# Patient Record
Sex: Female | Born: 1935
Health system: Southern US, Community
[De-identification: ages and names within clinical notes are randomized; demographics above are authoritative.]

## PROBLEM LIST (undated history)

## (undated) DIAGNOSIS — N289 Disorder of kidney and ureter, unspecified: Secondary | ICD-10-CM

## (undated) DIAGNOSIS — K922 Gastrointestinal hemorrhage, unspecified: Secondary | ICD-10-CM

## (undated) DIAGNOSIS — S72009A Fracture of unspecified part of neck of unspecified femur, initial encounter for closed fracture: Secondary | ICD-10-CM

## (undated) DIAGNOSIS — D539 Nutritional anemia, unspecified: Secondary | ICD-10-CM

## (undated) DIAGNOSIS — M069 Rheumatoid arthritis, unspecified: Secondary | ICD-10-CM

## (undated) DIAGNOSIS — E46 Unspecified protein-calorie malnutrition: Secondary | ICD-10-CM

## (undated) DIAGNOSIS — C50911 Malignant neoplasm of unspecified site of right female breast: Secondary | ICD-10-CM

## (undated) DIAGNOSIS — C50919 Malignant neoplasm of unspecified site of unspecified female breast: Secondary | ICD-10-CM

## (undated) DIAGNOSIS — R51 Headache: Secondary | ICD-10-CM

## (undated) DIAGNOSIS — M0520 Rheumatoid vasculitis with rheumatoid arthritis of unspecified site: Secondary | ICD-10-CM

## (undated) DIAGNOSIS — F0390 Unspecified dementia without behavioral disturbance: Secondary | ICD-10-CM

## (undated) DIAGNOSIS — G459 Transient cerebral ischemic attack, unspecified: Secondary | ICD-10-CM

## (undated) DIAGNOSIS — F329 Major depressive disorder, single episode, unspecified: Secondary | ICD-10-CM

## (undated) DIAGNOSIS — K5730 Diverticulosis of large intestine without perforation or abscess without bleeding: Secondary | ICD-10-CM

## (undated) DIAGNOSIS — I10 Essential (primary) hypertension: Secondary | ICD-10-CM

## (undated) DIAGNOSIS — F32A Depression, unspecified: Secondary | ICD-10-CM

## (undated) HISTORY — PX: CHOLECYSTECTOMY: SHX55

## (undated) HISTORY — DX: Essential (primary) hypertension: I10

## (undated) HISTORY — DX: Transient cerebral ischemic attack, unspecified: G45.9

## (undated) HISTORY — DX: Major depressive disorder, single episode, unspecified: F32.9

## (undated) HISTORY — DX: Malignant neoplasm of unspecified site of unspecified female breast: C50.919

## (undated) HISTORY — DX: Rheumatoid vasculitis with rheumatoid arthritis of unspecified site: M05.20

## (undated) HISTORY — DX: Depression, unspecified: F32.A

## (undated) HISTORY — DX: Nutritional anemia, unspecified: D53.9

## (undated) HISTORY — DX: Disorder of kidney and ureter, unspecified: N28.9

## (undated) HISTORY — PX: APPENDECTOMY: SHX54

---

## 1998-03-07 ENCOUNTER — Encounter: Admission: RE | Admit: 1998-03-07 | Discharge: 1998-03-07 | Payer: Self-pay | Admitting: Internal Medicine

## 1998-03-15 ENCOUNTER — Ambulatory Visit: Admission: RE | Admit: 1998-03-15 | Discharge: 1998-03-15 | Payer: Self-pay | Admitting: *Deleted

## 1998-04-21 ENCOUNTER — Encounter: Admission: RE | Admit: 1998-04-21 | Discharge: 1998-04-21 | Payer: Self-pay | Admitting: Internal Medicine

## 1998-06-23 ENCOUNTER — Ambulatory Visit (HOSPITAL_COMMUNITY): Admission: RE | Admit: 1998-06-23 | Discharge: 1998-06-23 | Payer: Self-pay | Admitting: Ophthalmology

## 1998-06-30 ENCOUNTER — Other Ambulatory Visit: Admission: RE | Admit: 1998-06-30 | Discharge: 1998-06-30 | Payer: Self-pay | Admitting: *Deleted

## 1998-06-30 ENCOUNTER — Encounter: Admission: RE | Admit: 1998-06-30 | Discharge: 1998-06-30 | Payer: Self-pay | Admitting: Internal Medicine

## 1998-08-21 ENCOUNTER — Encounter: Admission: RE | Admit: 1998-08-21 | Discharge: 1998-08-21 | Payer: Self-pay | Admitting: Internal Medicine

## 1998-08-21 ENCOUNTER — Ambulatory Visit (HOSPITAL_COMMUNITY): Admission: RE | Admit: 1998-08-21 | Discharge: 1998-08-21 | Payer: Self-pay | Admitting: Internal Medicine

## 1998-09-29 ENCOUNTER — Ambulatory Visit: Admission: RE | Admit: 1998-09-29 | Discharge: 1998-09-29 | Payer: Self-pay | Admitting: *Deleted

## 1998-10-23 ENCOUNTER — Encounter: Admission: RE | Admit: 1998-10-23 | Discharge: 1998-10-23 | Payer: Self-pay | Admitting: Internal Medicine

## 1998-10-26 ENCOUNTER — Ambulatory Visit (HOSPITAL_BASED_OUTPATIENT_CLINIC_OR_DEPARTMENT_OTHER): Admission: RE | Admit: 1998-10-26 | Discharge: 1998-10-26 | Payer: Self-pay | Admitting: Surgery

## 1998-10-26 ENCOUNTER — Encounter: Payer: Self-pay | Admitting: Surgery

## 1998-11-18 DIAGNOSIS — C50911 Malignant neoplasm of unspecified site of right female breast: Secondary | ICD-10-CM

## 1998-11-18 HISTORY — DX: Malignant neoplasm of unspecified site of right female breast: C50.911

## 1998-11-18 HISTORY — PX: MASTECTOMY: SHX3

## 1998-11-24 ENCOUNTER — Encounter: Admission: RE | Admit: 1998-11-24 | Discharge: 1999-02-22 | Payer: Self-pay | Admitting: Hematology and Oncology

## 1999-02-01 ENCOUNTER — Encounter: Admission: RE | Admit: 1999-02-01 | Discharge: 1999-02-01 | Payer: Self-pay | Admitting: Hematology and Oncology

## 1999-03-14 ENCOUNTER — Encounter: Admission: RE | Admit: 1999-03-14 | Discharge: 1999-03-14 | Payer: Self-pay | Admitting: Hematology and Oncology

## 1999-04-24 ENCOUNTER — Encounter: Admission: RE | Admit: 1999-04-24 | Discharge: 1999-04-24 | Payer: Self-pay | Admitting: Internal Medicine

## 1999-04-24 ENCOUNTER — Other Ambulatory Visit: Admission: RE | Admit: 1999-04-24 | Discharge: 1999-04-24 | Payer: Self-pay | Admitting: *Deleted

## 1999-05-04 ENCOUNTER — Ambulatory Visit (HOSPITAL_COMMUNITY): Admission: RE | Admit: 1999-05-04 | Discharge: 1999-05-04 | Payer: Self-pay | Admitting: *Deleted

## 1999-07-18 ENCOUNTER — Encounter: Admission: RE | Admit: 1999-07-18 | Discharge: 1999-07-18 | Payer: Self-pay | Admitting: Internal Medicine

## 1999-09-11 ENCOUNTER — Ambulatory Visit (HOSPITAL_COMMUNITY): Admission: RE | Admit: 1999-09-11 | Discharge: 1999-09-11 | Payer: Self-pay | Admitting: Ophthalmology

## 1999-10-17 ENCOUNTER — Encounter: Admission: RE | Admit: 1999-10-17 | Discharge: 1999-10-17 | Payer: Self-pay | Admitting: Hematology and Oncology

## 1999-10-17 ENCOUNTER — Ambulatory Visit (HOSPITAL_COMMUNITY): Admission: RE | Admit: 1999-10-17 | Discharge: 1999-10-17 | Payer: Self-pay | Admitting: Internal Medicine

## 1999-10-17 ENCOUNTER — Encounter: Payer: Self-pay | Admitting: Internal Medicine

## 1999-10-17 ENCOUNTER — Inpatient Hospital Stay (HOSPITAL_COMMUNITY): Admission: AD | Admit: 1999-10-17 | Discharge: 1999-10-18 | Payer: Self-pay | Admitting: Internal Medicine

## 1999-10-29 ENCOUNTER — Ambulatory Visit (HOSPITAL_COMMUNITY): Admission: RE | Admit: 1999-10-29 | Discharge: 1999-10-29 | Payer: Self-pay

## 1999-11-02 ENCOUNTER — Ambulatory Visit (HOSPITAL_COMMUNITY): Admission: RE | Admit: 1999-11-02 | Discharge: 1999-11-02 | Payer: Self-pay | Admitting: Cardiology

## 1999-11-19 HISTORY — PX: DENTAL SURGERY: SHX609

## 2000-01-02 ENCOUNTER — Encounter: Admission: RE | Admit: 2000-01-02 | Discharge: 2000-01-02 | Payer: Self-pay | Admitting: Internal Medicine

## 2000-01-25 ENCOUNTER — Encounter (HOSPITAL_COMMUNITY): Admission: RE | Admit: 2000-01-25 | Discharge: 2000-04-24 | Payer: Self-pay | Admitting: Dentistry

## 2000-02-14 ENCOUNTER — Encounter (HOSPITAL_COMMUNITY): Payer: Self-pay | Admitting: Dentistry

## 2000-03-03 ENCOUNTER — Encounter: Admission: RE | Admit: 2000-03-03 | Discharge: 2000-03-03 | Payer: Self-pay | Admitting: Internal Medicine

## 2000-03-05 ENCOUNTER — Encounter (HOSPITAL_COMMUNITY): Payer: Self-pay | Admitting: Dentistry

## 2000-03-05 ENCOUNTER — Encounter: Admission: RE | Admit: 2000-03-05 | Discharge: 2000-03-05 | Payer: Self-pay | Admitting: Dentistry

## 2000-03-06 ENCOUNTER — Ambulatory Visit (HOSPITAL_BASED_OUTPATIENT_CLINIC_OR_DEPARTMENT_OTHER): Admission: RE | Admit: 2000-03-06 | Discharge: 2000-03-06 | Payer: Self-pay | Admitting: Dentistry

## 2000-03-11 ENCOUNTER — Encounter: Payer: Self-pay | Admitting: Internal Medicine

## 2000-03-11 ENCOUNTER — Ambulatory Visit (HOSPITAL_COMMUNITY): Admission: RE | Admit: 2000-03-11 | Discharge: 2000-03-11 | Payer: Self-pay | Admitting: Internal Medicine

## 2000-03-13 ENCOUNTER — Encounter: Admission: RE | Admit: 2000-03-13 | Discharge: 2000-03-13 | Payer: Self-pay | Admitting: Internal Medicine

## 2000-04-21 ENCOUNTER — Encounter: Payer: Self-pay | Admitting: Thoracic Surgery

## 2000-04-25 ENCOUNTER — Inpatient Hospital Stay (HOSPITAL_COMMUNITY): Admission: RE | Admit: 2000-04-25 | Discharge: 2000-04-29 | Payer: Self-pay | Admitting: Thoracic Surgery

## 2000-04-25 ENCOUNTER — Encounter (INDEPENDENT_AMBULATORY_CARE_PROVIDER_SITE_OTHER): Payer: Self-pay | Admitting: Specialist

## 2000-04-25 ENCOUNTER — Encounter: Payer: Self-pay | Admitting: Thoracic Surgery

## 2000-04-26 ENCOUNTER — Encounter: Payer: Self-pay | Admitting: Thoracic Surgery

## 2000-04-27 ENCOUNTER — Encounter: Payer: Self-pay | Admitting: Thoracic Surgery

## 2000-04-28 ENCOUNTER — Encounter: Payer: Self-pay | Admitting: Thoracic Surgery

## 2000-05-06 ENCOUNTER — Encounter (HOSPITAL_COMMUNITY): Admission: RE | Admit: 2000-05-06 | Discharge: 2000-08-04 | Payer: Self-pay | Admitting: Dentistry

## 2000-05-06 ENCOUNTER — Encounter: Admission: RE | Admit: 2000-05-06 | Discharge: 2000-05-06 | Payer: Self-pay | Admitting: Thoracic Surgery

## 2000-05-06 ENCOUNTER — Encounter: Payer: Self-pay | Admitting: Thoracic Surgery

## 2000-05-19 ENCOUNTER — Encounter: Admission: RE | Admit: 2000-05-19 | Discharge: 2000-05-19 | Payer: Self-pay | Admitting: Thoracic Surgery

## 2000-05-19 ENCOUNTER — Encounter: Payer: Self-pay | Admitting: Thoracic Surgery

## 2000-06-17 ENCOUNTER — Encounter: Payer: Self-pay | Admitting: Thoracic Surgery

## 2000-06-17 ENCOUNTER — Encounter: Admission: RE | Admit: 2000-06-17 | Discharge: 2000-06-17 | Payer: Self-pay | Admitting: Thoracic Surgery

## 2000-06-20 ENCOUNTER — Encounter: Admission: RE | Admit: 2000-06-20 | Discharge: 2000-06-20 | Payer: Self-pay | Admitting: Internal Medicine

## 2000-08-26 ENCOUNTER — Encounter: Payer: Self-pay | Admitting: Thoracic Surgery

## 2000-08-26 ENCOUNTER — Encounter: Admission: RE | Admit: 2000-08-26 | Discharge: 2000-08-26 | Payer: Self-pay | Admitting: Thoracic Surgery

## 2000-10-14 ENCOUNTER — Encounter: Admission: RE | Admit: 2000-10-14 | Discharge: 2000-10-14 | Payer: Self-pay | Admitting: Internal Medicine

## 2000-11-06 ENCOUNTER — Encounter: Admission: RE | Admit: 2000-11-06 | Discharge: 2000-11-06 | Payer: Self-pay | Admitting: Internal Medicine

## 2000-11-06 ENCOUNTER — Encounter: Payer: Self-pay | Admitting: Internal Medicine

## 2001-03-13 ENCOUNTER — Encounter: Admission: RE | Admit: 2001-03-13 | Discharge: 2001-03-13 | Payer: Self-pay | Admitting: Internal Medicine

## 2001-04-09 ENCOUNTER — Encounter: Admission: RE | Admit: 2001-04-09 | Discharge: 2001-04-09 | Payer: Self-pay | Admitting: Internal Medicine

## 2001-11-09 ENCOUNTER — Encounter: Payer: Self-pay | Admitting: Internal Medicine

## 2001-11-09 ENCOUNTER — Encounter: Admission: RE | Admit: 2001-11-09 | Discharge: 2001-11-09 | Payer: Self-pay | Admitting: Internal Medicine

## 2002-01-07 ENCOUNTER — Encounter: Admission: RE | Admit: 2002-01-07 | Discharge: 2002-01-07 | Payer: Self-pay | Admitting: Internal Medicine

## 2002-01-07 ENCOUNTER — Ambulatory Visit (HOSPITAL_COMMUNITY): Admission: RE | Admit: 2002-01-07 | Discharge: 2002-01-07 | Payer: Self-pay | Admitting: Internal Medicine

## 2002-01-07 ENCOUNTER — Encounter: Payer: Self-pay | Admitting: Internal Medicine

## 2002-03-29 ENCOUNTER — Encounter: Admission: RE | Admit: 2002-03-29 | Discharge: 2002-03-29 | Payer: Self-pay | Admitting: Internal Medicine

## 2002-10-28 ENCOUNTER — Encounter: Admission: RE | Admit: 2002-10-28 | Discharge: 2002-10-28 | Payer: Self-pay | Admitting: Internal Medicine

## 2002-11-16 ENCOUNTER — Encounter: Payer: Self-pay | Admitting: Internal Medicine

## 2002-11-16 ENCOUNTER — Encounter: Admission: RE | Admit: 2002-11-16 | Discharge: 2002-11-16 | Payer: Self-pay | Admitting: Internal Medicine

## 2003-03-04 ENCOUNTER — Encounter: Admission: RE | Admit: 2003-03-04 | Discharge: 2003-03-04 | Payer: Self-pay | Admitting: Internal Medicine

## 2003-03-07 ENCOUNTER — Encounter: Admission: RE | Admit: 2003-03-07 | Discharge: 2003-03-07 | Payer: Self-pay | Admitting: Internal Medicine

## 2003-03-09 ENCOUNTER — Encounter: Admission: RE | Admit: 2003-03-09 | Discharge: 2003-03-09 | Payer: Self-pay | Admitting: Internal Medicine

## 2003-06-27 ENCOUNTER — Encounter: Admission: RE | Admit: 2003-06-27 | Discharge: 2003-06-27 | Payer: Self-pay | Admitting: Infectious Diseases

## 2003-11-22 ENCOUNTER — Encounter: Admission: RE | Admit: 2003-11-22 | Discharge: 2003-11-22 | Payer: Self-pay | Admitting: Internal Medicine

## 2003-11-23 ENCOUNTER — Encounter: Admission: RE | Admit: 2003-11-23 | Discharge: 2003-11-23 | Payer: Self-pay | Admitting: Internal Medicine

## 2003-12-02 ENCOUNTER — Encounter: Admission: RE | Admit: 2003-12-02 | Discharge: 2003-12-02 | Payer: Self-pay | Admitting: Internal Medicine

## 2003-12-15 ENCOUNTER — Encounter: Admission: RE | Admit: 2003-12-15 | Discharge: 2003-12-15 | Payer: Self-pay | Admitting: Internal Medicine

## 2004-10-29 ENCOUNTER — Ambulatory Visit: Payer: Self-pay | Admitting: Internal Medicine

## 2004-11-26 ENCOUNTER — Encounter: Admission: RE | Admit: 2004-11-26 | Discharge: 2004-11-26 | Payer: Self-pay | Admitting: Internal Medicine

## 2004-12-03 ENCOUNTER — Ambulatory Visit: Payer: Self-pay | Admitting: Internal Medicine

## 2004-12-31 ENCOUNTER — Ambulatory Visit: Payer: Self-pay | Admitting: Internal Medicine

## 2005-05-30 ENCOUNTER — Ambulatory Visit: Payer: Self-pay | Admitting: Internal Medicine

## 2005-06-26 ENCOUNTER — Ambulatory Visit: Payer: Self-pay | Admitting: Internal Medicine

## 2005-06-26 ENCOUNTER — Encounter (INDEPENDENT_AMBULATORY_CARE_PROVIDER_SITE_OTHER): Payer: Self-pay | Admitting: Internal Medicine

## 2005-06-26 LAB — CONVERTED CEMR LAB: TSH: 2 microintl units/mL

## 2005-07-03 ENCOUNTER — Ambulatory Visit: Payer: Self-pay | Admitting: Internal Medicine

## 2005-08-19 ENCOUNTER — Ambulatory Visit: Payer: Self-pay | Admitting: Hospitalist

## 2005-08-19 ENCOUNTER — Encounter (INDEPENDENT_AMBULATORY_CARE_PROVIDER_SITE_OTHER): Payer: Self-pay | Admitting: Internal Medicine

## 2005-08-19 LAB — CONVERTED CEMR LAB

## 2005-11-18 DIAGNOSIS — M069 Rheumatoid arthritis, unspecified: Secondary | ICD-10-CM

## 2005-11-18 HISTORY — DX: Rheumatoid arthritis, unspecified: M06.9

## 2005-12-04 ENCOUNTER — Encounter: Admission: RE | Admit: 2005-12-04 | Discharge: 2005-12-04 | Payer: Self-pay | Admitting: Internal Medicine

## 2006-09-26 ENCOUNTER — Encounter (INDEPENDENT_AMBULATORY_CARE_PROVIDER_SITE_OTHER): Payer: Self-pay | Admitting: Internal Medicine

## 2006-09-26 DIAGNOSIS — I10 Essential (primary) hypertension: Secondary | ICD-10-CM | POA: Insufficient documentation

## 2006-09-26 DIAGNOSIS — F341 Dysthymic disorder: Secondary | ICD-10-CM

## 2006-09-26 DIAGNOSIS — R519 Headache, unspecified: Secondary | ICD-10-CM | POA: Insufficient documentation

## 2006-09-26 DIAGNOSIS — R51 Headache: Secondary | ICD-10-CM

## 2006-09-26 DIAGNOSIS — M069 Rheumatoid arthritis, unspecified: Secondary | ICD-10-CM | POA: Insufficient documentation

## 2006-11-05 DIAGNOSIS — D529 Folate deficiency anemia, unspecified: Secondary | ICD-10-CM

## 2007-01-20 ENCOUNTER — Encounter: Admission: RE | Admit: 2007-01-20 | Discharge: 2007-01-20 | Payer: Self-pay | Admitting: Sports Medicine

## 2007-03-25 ENCOUNTER — Ambulatory Visit: Payer: Self-pay | Admitting: *Deleted

## 2007-03-25 ENCOUNTER — Encounter (INDEPENDENT_AMBULATORY_CARE_PROVIDER_SITE_OTHER): Payer: Self-pay | Admitting: Internal Medicine

## 2007-03-25 DIAGNOSIS — N63 Unspecified lump in unspecified breast: Secondary | ICD-10-CM | POA: Insufficient documentation

## 2007-03-25 LAB — CONVERTED CEMR LAB
Eosinophils Relative: 1 % (ref 0–5)
HCT: 37.7 % (ref 36.0–46.0)
Hemoglobin: 12.5 g/dL (ref 12.0–15.0)
Lymphocytes Relative: 15 % (ref 12–46)
Lymphs Abs: 1.1 10*3/uL (ref 0.7–3.3)
Platelets: 199 10*3/uL (ref 150–400)
RBC Folate: 364 ng/mL (ref 180–600)
WBC: 6.9 10*3/uL (ref 4.0–10.5)

## 2007-03-26 LAB — CONVERTED CEMR LAB
Albumin: 4 g/dL (ref 3.5–5.2)
Alkaline Phosphatase: 58 units/L (ref 39–117)
BUN: 22 mg/dL (ref 6–23)
Ferritin: 22 ng/mL (ref 10–291)
Glucose, Bld: 89 mg/dL (ref 70–99)
Iron: 71 ug/dL (ref 42–145)
Total Bilirubin: 0.7 mg/dL (ref 0.3–1.2)
UIBC: 229 ug/dL

## 2007-03-30 ENCOUNTER — Telehealth (INDEPENDENT_AMBULATORY_CARE_PROVIDER_SITE_OTHER): Payer: Self-pay | Admitting: Internal Medicine

## 2007-03-31 ENCOUNTER — Ambulatory Visit (HOSPITAL_COMMUNITY): Admission: RE | Admit: 2007-03-31 | Discharge: 2007-03-31 | Payer: Self-pay | Admitting: Internal Medicine

## 2007-04-03 ENCOUNTER — Encounter: Admission: RE | Admit: 2007-04-03 | Discharge: 2007-04-03 | Payer: Self-pay | Admitting: Internal Medicine

## 2007-04-08 ENCOUNTER — Ambulatory Visit: Payer: Self-pay | Admitting: Hospitalist

## 2007-04-08 ENCOUNTER — Encounter (INDEPENDENT_AMBULATORY_CARE_PROVIDER_SITE_OTHER): Payer: Self-pay | Admitting: Internal Medicine

## 2007-04-08 DIAGNOSIS — R413 Other amnesia: Secondary | ICD-10-CM

## 2007-04-22 ENCOUNTER — Ambulatory Visit (HOSPITAL_COMMUNITY): Admission: RE | Admit: 2007-04-22 | Discharge: 2007-04-22 | Payer: Self-pay | Admitting: Obstetrics

## 2007-04-22 ENCOUNTER — Encounter (INDEPENDENT_AMBULATORY_CARE_PROVIDER_SITE_OTHER): Payer: Self-pay | Admitting: Internal Medicine

## 2007-05-08 ENCOUNTER — Ambulatory Visit: Payer: Self-pay | Admitting: Internal Medicine

## 2007-05-08 DIAGNOSIS — M810 Age-related osteoporosis without current pathological fracture: Secondary | ICD-10-CM | POA: Insufficient documentation

## 2007-06-08 ENCOUNTER — Telehealth (INDEPENDENT_AMBULATORY_CARE_PROVIDER_SITE_OTHER): Payer: Self-pay | Admitting: *Deleted

## 2007-08-05 ENCOUNTER — Ambulatory Visit: Payer: Self-pay | Admitting: Internal Medicine

## 2007-08-05 ENCOUNTER — Encounter (INDEPENDENT_AMBULATORY_CARE_PROVIDER_SITE_OTHER): Payer: Self-pay | Admitting: Internal Medicine

## 2007-08-05 DIAGNOSIS — M25569 Pain in unspecified knee: Secondary | ICD-10-CM

## 2007-08-06 ENCOUNTER — Encounter (INDEPENDENT_AMBULATORY_CARE_PROVIDER_SITE_OTHER): Payer: Self-pay | Admitting: Internal Medicine

## 2007-08-06 LAB — CONVERTED CEMR LAB
BUN: 30 mg/dL — ABNORMAL HIGH (ref 6–23)
CO2: 21 meq/L (ref 19–32)
Chloride: 101 meq/L (ref 96–112)
Creatinine, Ser: 1.45 mg/dL — ABNORMAL HIGH (ref 0.40–1.20)
Glucose, Bld: 95 mg/dL (ref 70–99)
HCT: 36.9 % (ref 36.0–46.0)
Hemoglobin: 12 g/dL (ref 12.0–15.0)
RBC: 4.23 M/uL (ref 3.87–5.11)
RDW: 13.9 % (ref 11.5–14.0)

## 2007-08-19 ENCOUNTER — Telehealth: Payer: Self-pay | Admitting: *Deleted

## 2007-08-24 ENCOUNTER — Encounter (INDEPENDENT_AMBULATORY_CARE_PROVIDER_SITE_OTHER): Payer: Self-pay | Admitting: Internal Medicine

## 2007-08-24 ENCOUNTER — Ambulatory Visit: Payer: Self-pay | Admitting: *Deleted

## 2007-08-25 DIAGNOSIS — N259 Disorder resulting from impaired renal tubular function, unspecified: Secondary | ICD-10-CM | POA: Insufficient documentation

## 2007-08-26 ENCOUNTER — Telehealth: Payer: Self-pay | Admitting: *Deleted

## 2007-08-26 LAB — CONVERTED CEMR LAB
BUN: 27 mg/dL — ABNORMAL HIGH (ref 6–23)
Calcium: 10 mg/dL (ref 8.4–10.5)
Creatinine, Ser: 1.62 mg/dL — ABNORMAL HIGH (ref 0.40–1.20)
Potassium: 4.6 meq/L (ref 3.5–5.3)

## 2007-09-10 ENCOUNTER — Telehealth (INDEPENDENT_AMBULATORY_CARE_PROVIDER_SITE_OTHER): Payer: Self-pay | Admitting: Internal Medicine

## 2007-09-15 ENCOUNTER — Telehealth: Payer: Self-pay | Admitting: *Deleted

## 2007-09-25 ENCOUNTER — Ambulatory Visit: Payer: Self-pay | Admitting: Internal Medicine

## 2007-09-25 ENCOUNTER — Encounter (INDEPENDENT_AMBULATORY_CARE_PROVIDER_SITE_OTHER): Payer: Self-pay | Admitting: Internal Medicine

## 2007-11-22 ENCOUNTER — Ambulatory Visit: Payer: Self-pay | Admitting: Hospitalist

## 2007-11-22 ENCOUNTER — Inpatient Hospital Stay (HOSPITAL_COMMUNITY): Admission: EM | Admit: 2007-11-22 | Discharge: 2007-11-23 | Payer: Self-pay | Admitting: Emergency Medicine

## 2007-11-23 ENCOUNTER — Ambulatory Visit: Payer: Self-pay | Admitting: Vascular Surgery

## 2007-11-23 ENCOUNTER — Encounter (INDEPENDENT_AMBULATORY_CARE_PROVIDER_SITE_OTHER): Payer: Self-pay | Admitting: Internal Medicine

## 2007-11-23 ENCOUNTER — Encounter (INDEPENDENT_AMBULATORY_CARE_PROVIDER_SITE_OTHER): Payer: Self-pay | Admitting: Hospitalist

## 2007-12-03 ENCOUNTER — Encounter (INDEPENDENT_AMBULATORY_CARE_PROVIDER_SITE_OTHER): Payer: Self-pay | Admitting: Internal Medicine

## 2007-12-03 ENCOUNTER — Ambulatory Visit (HOSPITAL_COMMUNITY): Admission: RE | Admit: 2007-12-03 | Discharge: 2007-12-03 | Payer: Self-pay | Admitting: Internal Medicine

## 2007-12-03 ENCOUNTER — Ambulatory Visit: Payer: Self-pay | Admitting: Internal Medicine

## 2007-12-03 DIAGNOSIS — M25519 Pain in unspecified shoulder: Secondary | ICD-10-CM

## 2007-12-03 DIAGNOSIS — G459 Transient cerebral ischemic attack, unspecified: Secondary | ICD-10-CM | POA: Insufficient documentation

## 2007-12-03 LAB — CONVERTED CEMR LAB
Basophils Relative: 0 % (ref 0–1)
Hemoglobin: 11 g/dL — ABNORMAL LOW (ref 12.0–15.0)
Lymphs Abs: 1.2 10*3/uL (ref 0.7–4.0)
MCHC: 33 g/dL (ref 30.0–36.0)
MCV: 86.8 fL (ref 78.0–100.0)
Monocytes Absolute: 0.6 10*3/uL (ref 0.1–1.0)
Monocytes Relative: 10 % (ref 3–12)
Neutro Abs: 4.2 10*3/uL (ref 1.7–7.7)
Neutrophils Relative %: 70 % (ref 43–77)
RBC: 3.84 M/uL — ABNORMAL LOW (ref 3.87–5.11)
WBC: 6 10*3/uL (ref 4.0–10.5)

## 2007-12-04 ENCOUNTER — Encounter (INDEPENDENT_AMBULATORY_CARE_PROVIDER_SITE_OTHER): Payer: Self-pay | Admitting: Internal Medicine

## 2007-12-07 ENCOUNTER — Telehealth: Payer: Self-pay | Admitting: *Deleted

## 2007-12-07 LAB — CONVERTED CEMR LAB
ALT: <8 units/L (ref 0–35)
AST: 17 units/L (ref 0–37)
BUN: 22 mg/dL (ref 6–23)
Creatinine, Ser: 1.25 mg/dL — ABNORMAL HIGH (ref 0.40–1.20)
Creatinine, Urine: 107.5 mg/dL
Microalb Creat Ratio: 3 mg/g (ref 0.0–30.0)
Total Bilirubin: 0.7 mg/dL (ref 0.3–1.2)
Vitamin B-12: 296 pg/mL (ref 211–911)

## 2008-03-06 IMAGING — CT CT HEAD W/O CM
1 series · 16 of 30 positions shown, 20 images · IV contrast (agent unspecified)
Comparison: None.

CLINICAL DATA: Hypertension.  Facial numbness.  Blurred vision.  Headache.
 HEAD CT WITHOUT CONTRAST:
TECHNIQUE: Contiguous axial images were obtained from the base of the skull through the vertex according to standard protocol without contrast.

[Series 2: head routine 4.8 h47s · axial · 0.45mm/px · z∈[+1332,+1465]mm · 16 of 30 slices shown, 20 images]
[im 2/30  brain]
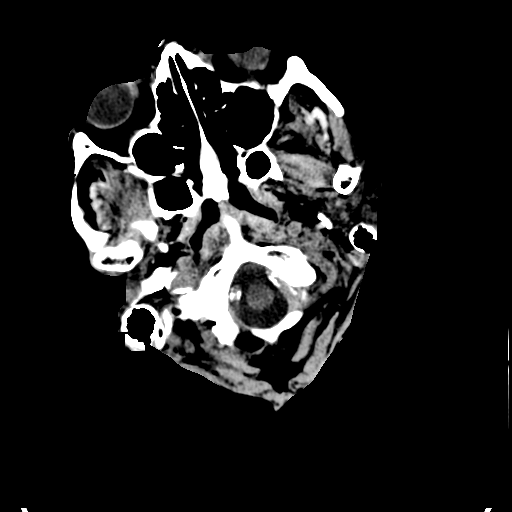
[im 2/30  bone]
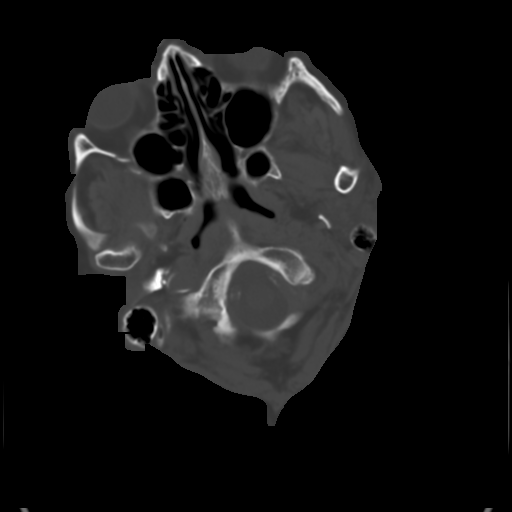
[im 4/30  brain]
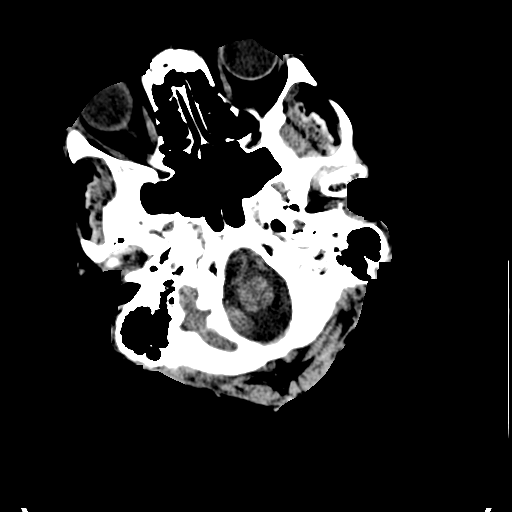
[im 6/30  brain]
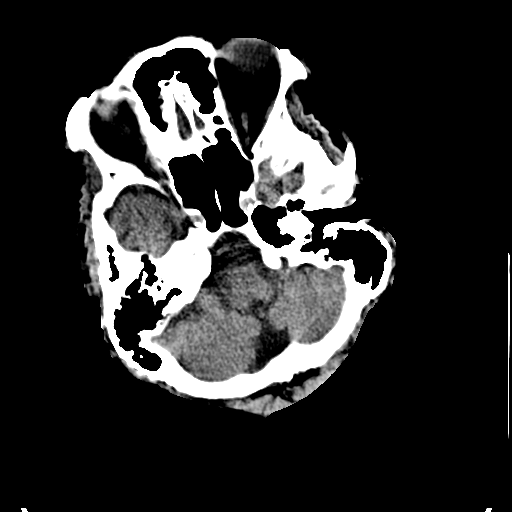
[im 8/30  brain]
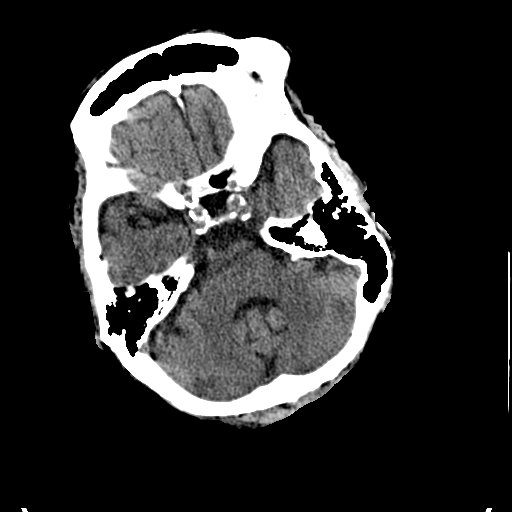
[im 9/30  brain]
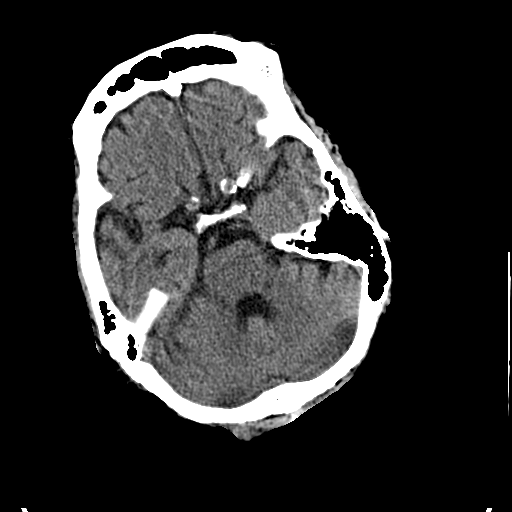
[im 9/30  bone]
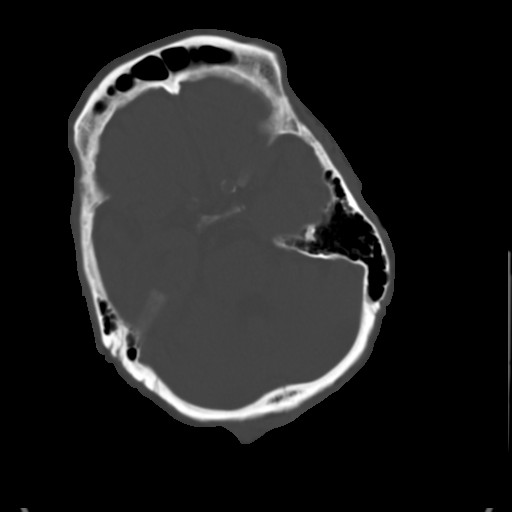
[im 11/30  brain]
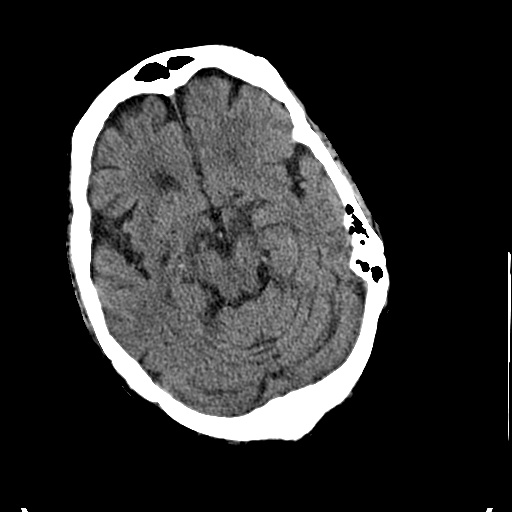
[im 13/30  brain]
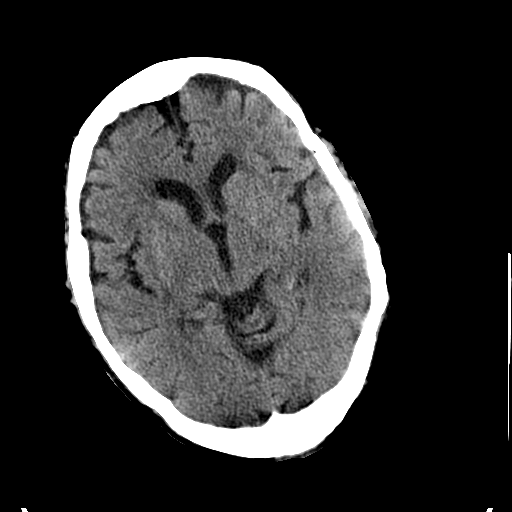
[im 15/30  brain]
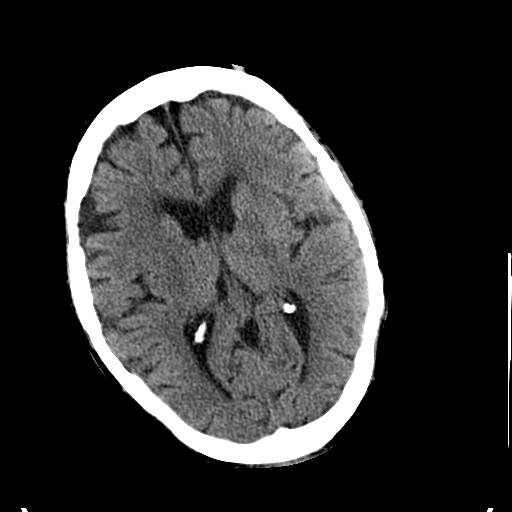
[im 16/30  brain]
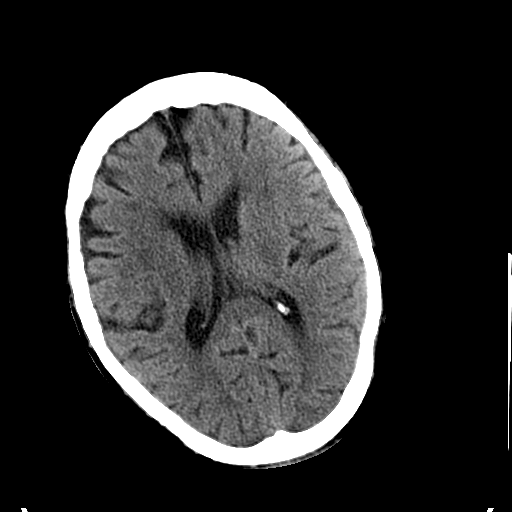
[im 16/30  bone]
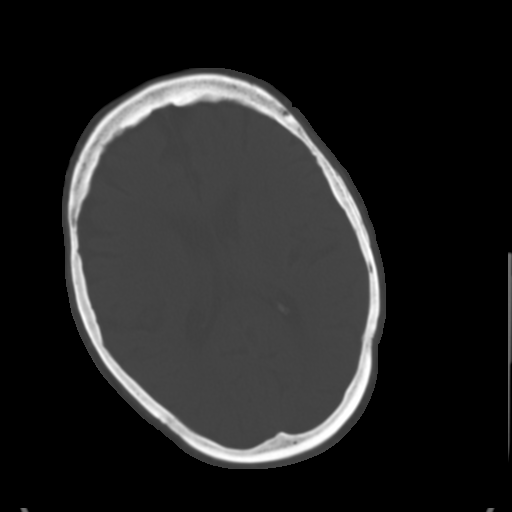
[im 18/30  brain]
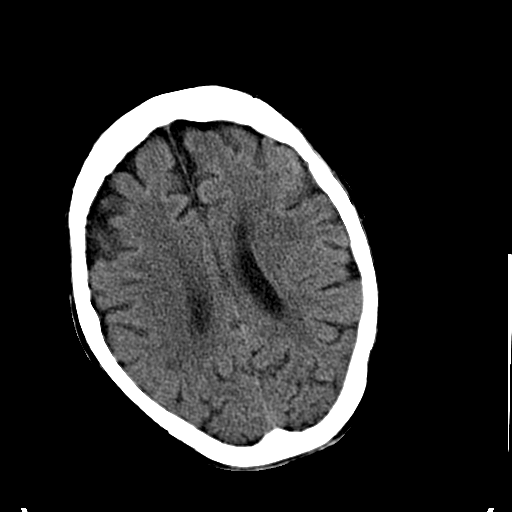
[im 20/30  brain]
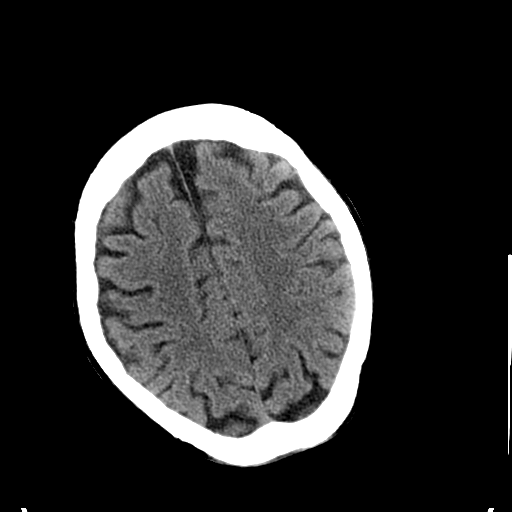
[im 22/30  brain]
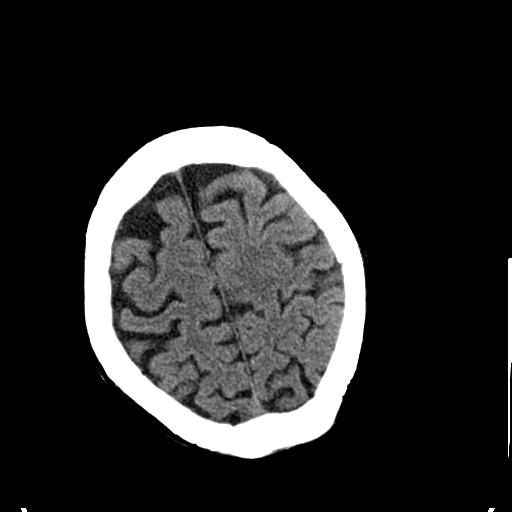
[im 23/30  brain]
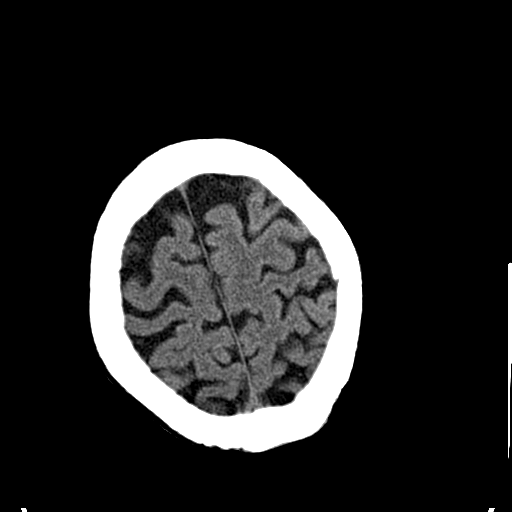
[im 23/30  bone]
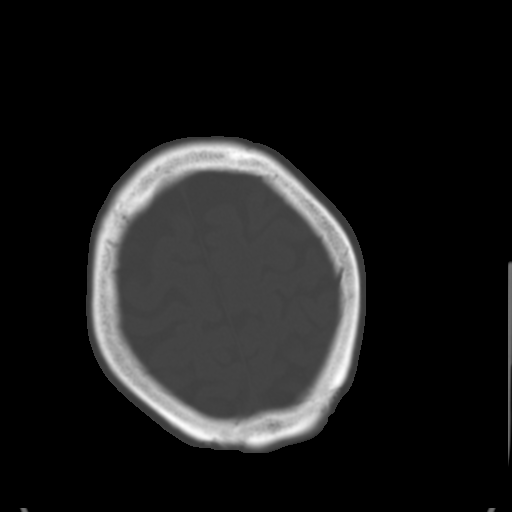
[im 25/30  brain]
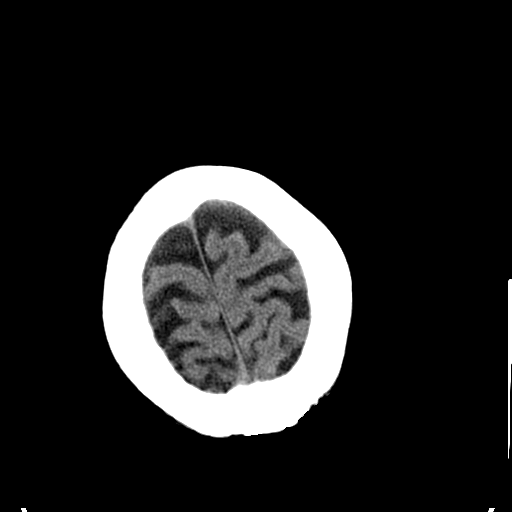
[im 27/30  brain]
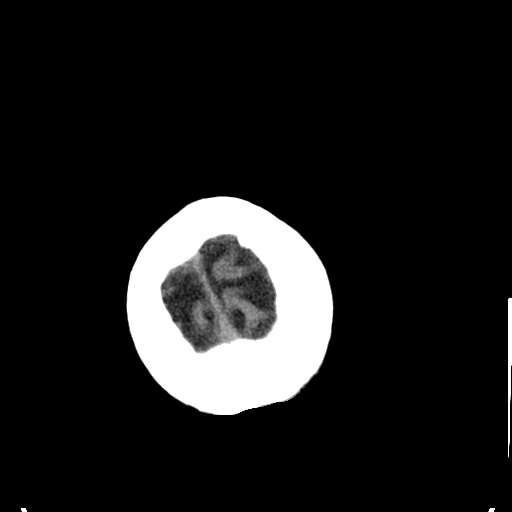
[im 29/30  brain]
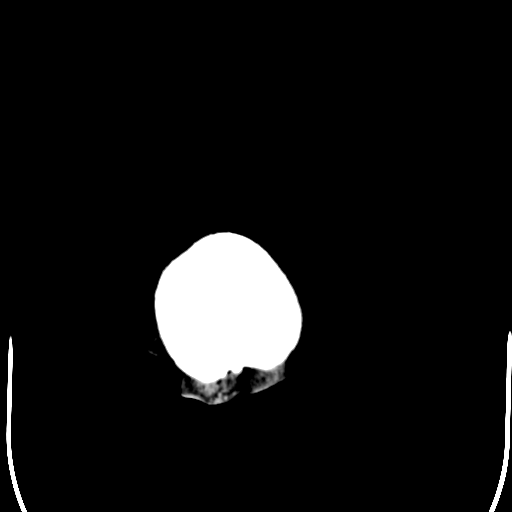

[16 of 30 positions shown; findings below may reference images not displayed]

FINDINGS: There is mild atrophy.  Mild chronic microvascular changes are present in the white matter bilaterally.  There is no hemorrhage or mass.  No acute cortical infarct is identified.
IMPRESSION: No acute abnormality.

## 2008-03-06 IMAGING — CR DG CHEST 1V PORT
1 series · 1 of 1 positions shown · non-contrast
Comparison: none

CLINICAL DATA: Hypertension, resolved facial numbness. 
 PORTABLE CHEST - 1 VIEW - 11/22/07 AT 2702 HOURS:

[AP]
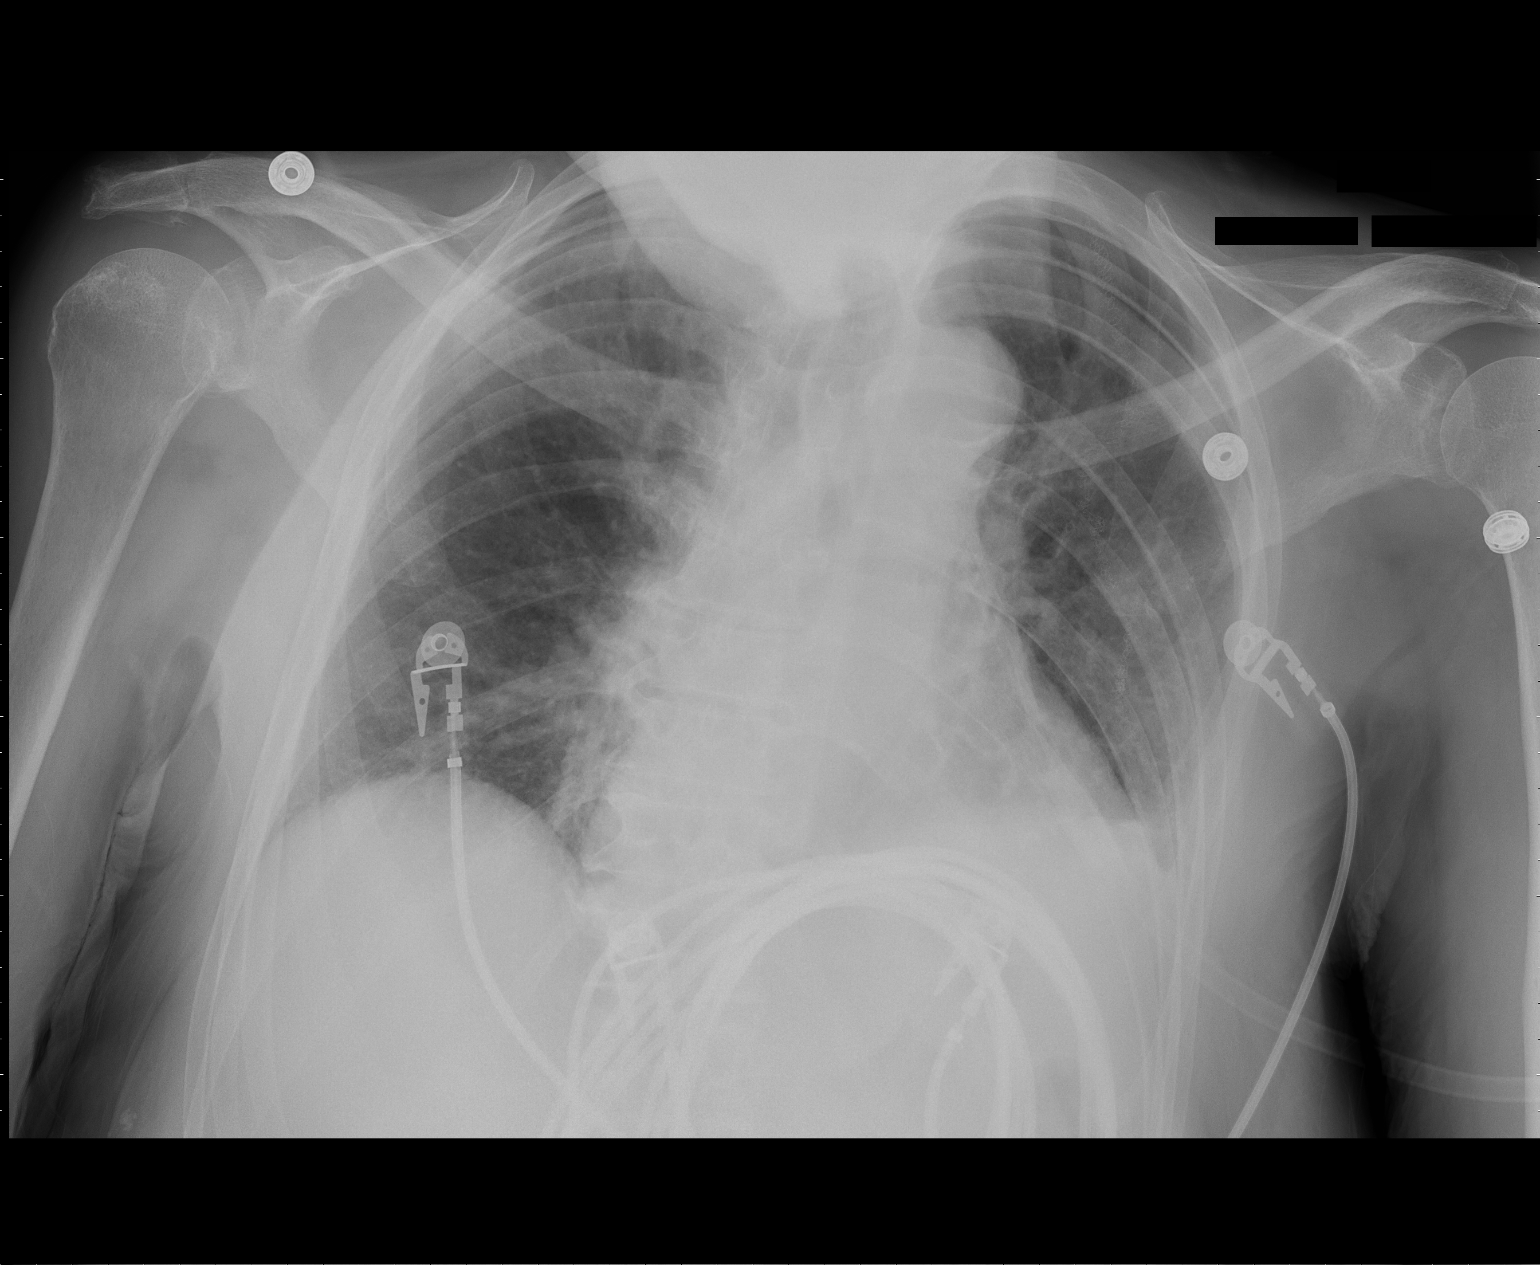

[1 of 1 positions shown; findings below may reference images not displayed]

FINDINGS: Mild atelectasis at the lung bases.  Negative for pneumonia or pulmonary edema.  Cardiomegaly.
IMPRESSION: Cardiomegaly.  Low-volume lungs.  Mild atelectasis at the bases.

## 2008-03-09 ENCOUNTER — Encounter: Admission: RE | Admit: 2008-03-09 | Discharge: 2008-03-09 | Payer: Self-pay | Admitting: Internal Medicine

## 2008-05-06 ENCOUNTER — Ambulatory Visit: Payer: Self-pay | Admitting: *Deleted

## 2008-05-06 ENCOUNTER — Encounter (INDEPENDENT_AMBULATORY_CARE_PROVIDER_SITE_OTHER): Payer: Self-pay | Admitting: Internal Medicine

## 2008-05-10 LAB — CONVERTED CEMR LAB
AST: 18 units/L (ref 0–37)
Albumin: 4.1 g/dL (ref 3.5–5.2)
Alkaline Phosphatase: 49 units/L (ref 39–117)
BUN: 22 mg/dL (ref 6–23)
Basophils Relative: 0 % (ref 0–1)
Creatinine, Ser: 1.07 mg/dL (ref 0.40–1.20)
Eosinophils Absolute: 0.1 10*3/uL (ref 0.0–0.7)
Eosinophils Relative: 1 % (ref 0–5)
Glucose, Bld: 87 mg/dL (ref 70–99)
HCT: 33.8 % — ABNORMAL LOW (ref 36.0–46.0)
HDL: 80 mg/dL (ref >39)
LDL Cholesterol: 103 mg/dL — ABNORMAL HIGH (ref 0–99)
Lymphs Abs: 1.1 10*3/uL (ref 0.7–4.0)
MCHC: 31.7 g/dL (ref 30.0–36.0)
MCV: 93.1 fL (ref 78.0–100.0)
Monocytes Absolute: 0.6 10*3/uL (ref 0.1–1.0)
Monocytes Relative: 10 % (ref 3–12)
Neutrophils Relative %: 70 % (ref 43–77)
Potassium: 4.6 meq/L (ref 3.5–5.3)
RBC: 3.63 M/uL — ABNORMAL LOW (ref 3.87–5.11)
Total Bilirubin: 0.5 mg/dL (ref 0.3–1.2)
Total CHOL/HDL Ratio: 2.5
Triglycerides: 64 mg/dL (ref <150)
VLDL: 13 mg/dL (ref 0–40)
Vit D, 1,25-Dihydroxy: 27 — ABNORMAL LOW (ref 30–89)
WBC: 6.1 10*3/uL (ref 4.0–10.5)

## 2008-06-29 ENCOUNTER — Telehealth (INDEPENDENT_AMBULATORY_CARE_PROVIDER_SITE_OTHER): Payer: Self-pay | Admitting: *Deleted

## 2008-09-02 ENCOUNTER — Encounter (INDEPENDENT_AMBULATORY_CARE_PROVIDER_SITE_OTHER): Payer: Self-pay | Admitting: Internal Medicine

## 2008-09-02 ENCOUNTER — Encounter: Payer: Self-pay | Admitting: Licensed Clinical Social Worker

## 2008-09-02 ENCOUNTER — Ambulatory Visit: Payer: Self-pay | Admitting: Internal Medicine

## 2008-09-05 LAB — CONVERTED CEMR LAB
Basophils Absolute: 0 10*3/uL (ref 0.0–0.1)
Eosinophils Absolute: 0 10*3/uL (ref 0.0–0.7)
Eosinophils Relative: 1 % (ref 0–5)
HCT: 30.3 % — ABNORMAL LOW (ref 36.0–46.0)
Hemoglobin: 9.7 g/dL — ABNORMAL LOW (ref 12.0–15.0)
Lymphocytes Relative: 16 % (ref 12–46)
Lymphs Abs: 1 10*3/uL (ref 0.7–4.0)
MCV: 86.5 fL (ref 78.0–100.0)
Monocytes Absolute: 0.6 10*3/uL (ref 0.1–1.0)
Platelets: 219 10*3/uL (ref 150–400)
RDW: 13.7 % (ref 11.5–15.5)

## 2008-10-06 ENCOUNTER — Encounter (INDEPENDENT_AMBULATORY_CARE_PROVIDER_SITE_OTHER): Payer: Self-pay | Admitting: Internal Medicine

## 2008-10-06 ENCOUNTER — Ambulatory Visit: Payer: Self-pay | Admitting: Internal Medicine

## 2008-10-06 DIAGNOSIS — E538 Deficiency of other specified B group vitamins: Secondary | ICD-10-CM | POA: Insufficient documentation

## 2008-10-11 ENCOUNTER — Telehealth: Payer: Self-pay | Admitting: *Deleted

## 2008-10-12 ENCOUNTER — Ambulatory Visit: Payer: Self-pay | Admitting: *Deleted

## 2008-11-22 ENCOUNTER — Telehealth: Payer: Self-pay | Admitting: *Deleted

## 2008-12-02 ENCOUNTER — Ambulatory Visit: Payer: Self-pay | Admitting: Infectious Disease

## 2008-12-02 ENCOUNTER — Encounter (INDEPENDENT_AMBULATORY_CARE_PROVIDER_SITE_OTHER): Payer: Self-pay | Admitting: Internal Medicine

## 2008-12-05 LAB — CONVERTED CEMR LAB
ALT: <8 units/L (ref 0–35)
AST: 18 units/L (ref 0–37)
Alkaline Phosphatase: 55 units/L (ref 39–117)
BUN: 33 mg/dL — ABNORMAL HIGH (ref 6–23)
Creatinine, Ser: 1.35 mg/dL — ABNORMAL HIGH (ref 0.40–1.20)
HCT: 31.1 % — ABNORMAL LOW (ref 36.0–46.0)
Hemoglobin: 9.5 g/dL — ABNORMAL LOW (ref 12.0–15.0)
MCHC: 30.5 g/dL (ref 30.0–36.0)
Platelets: 246 10*3/uL (ref 150–400)
RDW: 16.4 % — ABNORMAL HIGH (ref 11.5–15.5)

## 2009-02-07 ENCOUNTER — Ambulatory Visit: Payer: Self-pay | Admitting: Internal Medicine

## 2009-02-07 ENCOUNTER — Encounter (INDEPENDENT_AMBULATORY_CARE_PROVIDER_SITE_OTHER): Payer: Self-pay | Admitting: Internal Medicine

## 2009-02-07 DIAGNOSIS — R443 Hallucinations, unspecified: Secondary | ICD-10-CM

## 2009-02-07 DIAGNOSIS — E785 Hyperlipidemia, unspecified: Secondary | ICD-10-CM

## 2009-02-08 ENCOUNTER — Encounter (INDEPENDENT_AMBULATORY_CARE_PROVIDER_SITE_OTHER): Payer: Self-pay | Admitting: Internal Medicine

## 2009-02-08 LAB — CONVERTED CEMR LAB
Basophils Absolute: 0 10*3/uL (ref 0.0–0.1)
Basophils Relative: 0 % (ref 0–1)
Eosinophils Absolute: 0.1 10*3/uL (ref 0.0–0.7)
Eosinophils Relative: 2 % (ref 0–5)
HCT: 32.1 % — ABNORMAL LOW (ref 36.0–46.0)
Hemoglobin: 10.1 g/dL — ABNORMAL LOW (ref 12.0–15.0)
MCHC: 31.5 g/dL (ref 30.0–36.0)
MCV: 81.9 fL (ref 78.0–100.0)
Monocytes Absolute: 0.6 10*3/uL (ref 0.1–1.0)
RDW: 16.6 % — ABNORMAL HIGH (ref 11.5–15.5)

## 2009-03-14 ENCOUNTER — Ambulatory Visit: Payer: Self-pay | Admitting: Internal Medicine

## 2009-06-22 ENCOUNTER — Ambulatory Visit (HOSPITAL_COMMUNITY): Admission: RE | Admit: 2009-06-22 | Discharge: 2009-06-22 | Payer: Self-pay | Admitting: Internal Medicine

## 2009-06-22 ENCOUNTER — Encounter: Payer: Self-pay | Admitting: Internal Medicine

## 2009-06-22 ENCOUNTER — Encounter (INDEPENDENT_AMBULATORY_CARE_PROVIDER_SITE_OTHER): Payer: Self-pay | Admitting: Internal Medicine

## 2009-06-22 ENCOUNTER — Ambulatory Visit: Payer: Self-pay | Admitting: Internal Medicine

## 2009-06-22 DIAGNOSIS — R000 Tachycardia, unspecified: Secondary | ICD-10-CM

## 2009-06-22 DIAGNOSIS — I440 Atrioventricular block, first degree: Secondary | ICD-10-CM | POA: Insufficient documentation

## 2009-06-23 ENCOUNTER — Encounter (INDEPENDENT_AMBULATORY_CARE_PROVIDER_SITE_OTHER): Payer: Self-pay | Admitting: Internal Medicine

## 2009-06-23 LAB — CONVERTED CEMR LAB
HCT: 38 % (ref 36.0–46.0)
MCHC: 31.8 g/dL (ref 30.0–36.0)
MCV: 91.6 fL (ref >=78.0)
Platelets: 229 10*3/uL (ref 150–400)
RDW: 15.9 % — ABNORMAL HIGH (ref 11.5–15.5)
WBC: 6.5 10*3/uL (ref 4.0–10.5)

## 2009-07-04 ENCOUNTER — Ambulatory Visit: Payer: Self-pay | Admitting: Gastroenterology

## 2009-07-04 DIAGNOSIS — K921 Melena: Secondary | ICD-10-CM

## 2009-07-04 LAB — CONVERTED CEMR LAB
ALT: 10 units/L (ref 0–35)
Alkaline Phosphatase: 45 units/L (ref 39–117)
Basophils Absolute: 0.3 10*3/uL — ABNORMAL HIGH (ref 0.0–0.1)
CO2: 28 meq/L (ref 19–32)
Ferritin: 31.3 ng/mL (ref 10.0–291.0)
Folate: 14.3 ng/mL
HCT: 35.7 % — ABNORMAL LOW (ref 36.0–46.0)
Iron: 58 ug/dL (ref 42–145)
Lymphs Abs: 1 10*3/uL (ref 0.7–4.0)
Monocytes Absolute: 0.5 10*3/uL (ref 0.1–1.0)
Monocytes Relative: 7.8 % (ref 3.0–12.0)
Platelets: 182 10*3/uL (ref 150.0–400.0)
RDW: 14.6 % (ref 11.5–14.6)
Saturation Ratios: 23.2 % (ref 20.0–50.0)
Sodium: 142 meq/L (ref 135–145)
Total Bilirubin: 1.2 mg/dL (ref 0.3–1.2)
Total Protein: 7.5 g/dL (ref 6.0–8.3)
Transferrin: 178.7 mg/dL — ABNORMAL LOW (ref 212.0–360.0)

## 2009-07-10 ENCOUNTER — Ambulatory Visit: Payer: Self-pay | Admitting: Gastroenterology

## 2009-07-11 ENCOUNTER — Encounter: Admission: RE | Admit: 2009-07-11 | Discharge: 2009-07-11 | Payer: Self-pay | Admitting: Internal Medicine

## 2009-07-21 ENCOUNTER — Ambulatory Visit: Payer: Self-pay | Admitting: Gastroenterology

## 2009-07-21 ENCOUNTER — Encounter: Payer: Self-pay | Admitting: Gastroenterology

## 2009-07-21 DIAGNOSIS — K5730 Diverticulosis of large intestine without perforation or abscess without bleeding: Secondary | ICD-10-CM

## 2009-07-21 HISTORY — DX: Diverticulosis of large intestine without perforation or abscess without bleeding: K57.30

## 2009-07-21 LAB — HM COLONOSCOPY: HM Colonoscopy: NORMAL

## 2009-07-25 ENCOUNTER — Telehealth: Payer: Self-pay | Admitting: Internal Medicine

## 2009-07-27 ENCOUNTER — Encounter: Payer: Self-pay | Admitting: Gastroenterology

## 2009-10-18 ENCOUNTER — Ambulatory Visit: Payer: Self-pay | Admitting: Internal Medicine

## 2009-10-23 ENCOUNTER — Telehealth (INDEPENDENT_AMBULATORY_CARE_PROVIDER_SITE_OTHER): Payer: Self-pay | Admitting: *Deleted

## 2009-10-31 ENCOUNTER — Telehealth: Payer: Self-pay | Admitting: Licensed Clinical Social Worker

## 2009-11-18 HISTORY — PX: OTHER SURGICAL HISTORY: SHX169

## 2009-11-30 ENCOUNTER — Ambulatory Visit: Payer: Self-pay | Admitting: Internal Medicine

## 2009-11-30 DIAGNOSIS — L989 Disorder of the skin and subcutaneous tissue, unspecified: Secondary | ICD-10-CM | POA: Insufficient documentation

## 2009-12-14 ENCOUNTER — Ambulatory Visit: Payer: Self-pay | Admitting: Internal Medicine

## 2009-12-14 ENCOUNTER — Encounter (INDEPENDENT_AMBULATORY_CARE_PROVIDER_SITE_OTHER): Payer: Self-pay | Admitting: Internal Medicine

## 2009-12-14 DIAGNOSIS — R42 Dizziness and giddiness: Secondary | ICD-10-CM

## 2009-12-21 LAB — CONVERTED CEMR LAB
BUN: 23 mg/dL (ref 6–23)
Creatinine, Ser: 1.26 mg/dL — ABNORMAL HIGH (ref 0.40–1.20)
Vitamin B-12: 250 pg/mL (ref 211–911)

## 2010-01-12 ENCOUNTER — Ambulatory Visit: Payer: Self-pay | Admitting: Internal Medicine

## 2010-01-19 ENCOUNTER — Encounter: Payer: Self-pay | Admitting: Licensed Clinical Social Worker

## 2010-01-26 ENCOUNTER — Ambulatory Visit: Payer: Self-pay | Admitting: Infectious Diseases

## 2010-02-09 ENCOUNTER — Ambulatory Visit: Payer: Self-pay | Admitting: Internal Medicine

## 2010-02-28 ENCOUNTER — Ambulatory Visit: Payer: Self-pay | Admitting: Internal Medicine

## 2010-03-01 ENCOUNTER — Encounter: Payer: Self-pay | Admitting: Internal Medicine

## 2010-03-01 LAB — CONVERTED CEMR LAB
Calcium: 9.4 mg/dL (ref 8.4–10.5)
Cholesterol: 180 mg/dL (ref 0–200)
HDL: 72 mg/dL (ref >39)
Sodium: 141 meq/L (ref 135–145)
Total CHOL/HDL Ratio: 2.5

## 2010-03-29 ENCOUNTER — Ambulatory Visit: Payer: Self-pay | Admitting: Internal Medicine

## 2010-04-23 ENCOUNTER — Telehealth: Payer: Self-pay | Admitting: Internal Medicine

## 2010-05-29 ENCOUNTER — Emergency Department (HOSPITAL_COMMUNITY): Admission: EM | Admit: 2010-05-29 | Discharge: 2010-05-29 | Payer: Self-pay | Admitting: Emergency Medicine

## 2010-06-18 ENCOUNTER — Ambulatory Visit: Payer: Self-pay | Admitting: Internal Medicine

## 2010-07-12 ENCOUNTER — Encounter: Admission: RE | Admit: 2010-07-12 | Discharge: 2010-07-12 | Payer: Self-pay

## 2010-07-12 LAB — HM MAMMOGRAPHY: HM Mammogram: NEGATIVE

## 2010-09-05 ENCOUNTER — Telehealth: Payer: Self-pay | Admitting: Internal Medicine

## 2010-09-06 ENCOUNTER — Ambulatory Visit: Payer: Self-pay | Admitting: Internal Medicine

## 2010-09-06 ENCOUNTER — Encounter: Payer: Self-pay | Admitting: Internal Medicine

## 2010-09-06 ENCOUNTER — Ambulatory Visit (HOSPITAL_COMMUNITY): Admission: RE | Admit: 2010-09-06 | Discharge: 2010-09-06 | Payer: Self-pay | Admitting: Internal Medicine

## 2010-09-09 ENCOUNTER — Ambulatory Visit: Payer: Self-pay | Admitting: Internal Medicine

## 2010-09-09 ENCOUNTER — Inpatient Hospital Stay (HOSPITAL_COMMUNITY): Admission: EM | Admit: 2010-09-09 | Discharge: 2010-09-11 | Payer: Self-pay | Admitting: Emergency Medicine

## 2010-09-09 ENCOUNTER — Encounter: Payer: Self-pay | Admitting: Internal Medicine

## 2010-09-10 ENCOUNTER — Encounter: Payer: Self-pay | Admitting: Internal Medicine

## 2010-09-10 DIAGNOSIS — K922 Gastrointestinal hemorrhage, unspecified: Secondary | ICD-10-CM

## 2010-09-10 HISTORY — DX: Gastrointestinal hemorrhage, unspecified: K92.2

## 2010-09-11 ENCOUNTER — Encounter: Payer: Self-pay | Admitting: Internal Medicine

## 2010-09-14 ENCOUNTER — Telehealth: Payer: Self-pay | Admitting: Internal Medicine

## 2010-09-19 ENCOUNTER — Ambulatory Visit: Payer: Self-pay | Admitting: Internal Medicine

## 2010-09-20 ENCOUNTER — Telehealth: Payer: Self-pay | Admitting: Internal Medicine

## 2010-09-21 ENCOUNTER — Telehealth: Payer: Self-pay | Admitting: Internal Medicine

## 2010-09-21 LAB — CONVERTED CEMR LAB
MCV: 82.3 fL (ref >=78.0)
RBC: 4 M/uL (ref 3.87–5.11)
RDW: 23.9 % — ABNORMAL HIGH (ref 11.5–15.5)

## 2010-09-28 ENCOUNTER — Telehealth (INDEPENDENT_AMBULATORY_CARE_PROVIDER_SITE_OTHER): Payer: Self-pay | Admitting: *Deleted

## 2010-10-01 ENCOUNTER — Ambulatory Visit: Payer: Self-pay | Admitting: Internal Medicine

## 2010-10-01 ENCOUNTER — Telehealth: Payer: Self-pay | Admitting: Licensed Clinical Social Worker

## 2010-10-08 ENCOUNTER — Encounter: Payer: Self-pay | Admitting: Internal Medicine

## 2010-10-23 ENCOUNTER — Ambulatory Visit: Payer: Self-pay | Admitting: Internal Medicine

## 2010-10-23 LAB — CONVERTED CEMR LAB
HCT: 38 % (ref 36.0–46.0)
Hemoglobin: 11.6 g/dL — ABNORMAL LOW (ref 12.0–15.0)
MCHC: 30.5 g/dL (ref 30.0–36.0)
RBC: 4.3 M/uL (ref 3.87–5.11)

## 2010-11-03 ENCOUNTER — Encounter: Payer: Self-pay | Admitting: *Deleted

## 2010-12-08 ENCOUNTER — Encounter: Payer: Self-pay | Admitting: Internal Medicine

## 2010-12-09 ENCOUNTER — Encounter: Payer: Self-pay | Admitting: Internal Medicine

## 2010-12-10 ENCOUNTER — Encounter: Payer: Self-pay | Admitting: Internal Medicine

## 2010-12-16 LAB — CONVERTED CEMR LAB
Calcium, Total (PTH): 9.7 mg/dL (ref 8.4–10.5)
Calcium: 9.9 mg/dL (ref 8.4–10.5)
PTH: 100.5 pg/mL — ABNORMAL HIGH (ref 14.0–72.0)
Potassium: 4.2 meq/L (ref 3.5–5.3)
Sodium: 138 meq/L (ref 135–145)

## 2010-12-18 NOTE — Assessment & Plan Note (Signed)
Summary: chest pain f/u/gg   see note   Vital Signs:  Patient profile:   77 year old female Height:      65.5 inches Weight:      131.1 pounds BMI:     21.56 O2 Sat:      100 % on Room air Temp:     99.6 degrees F oral Pulse rate:   73 / minute BP sitting:   134 / 73  (right arm)  Vitals Entered By: Filomena Jungling NT II (September 06, 2010 9:59 AM)  O2 Flow:  Room air CC: COUGH AND CONJESTION X 2 WEEKS, NOT ABLE TO COUGH UP ANYTHING, Depression Pain Assessment Patient in pain? yes     Location: CHEST, AND HEADACHE Intensity: 5 Type: aching Onset of pain  Intermittent Nutritional Status BMI of 19 -24 = normal  Have you ever been in a relationship where you felt threatened, hurt or afraid?No   Does patient need assistance? Functional Status Self care Ambulation Normal   Primary Care Provider:  Lars Mage MD  CC:  COUGH AND CONJESTION X 2 WEEKS, NOT ABLE TO COUGH UP ANYTHING, and Depression.  History of Present Illness: 77 y/o w with h/o TIA, 1st degree AV block, HLD, HTN and not other cardiac history comes to the clinic complaining of left sided chest pain  - started 2 weeks ago, started when she stood up from sitting position, trying to get water - has been episodic since then, typical lasts for unkown period of time once they occur -  no aggravating factoe. can occur with exertion as well as rest   releived with lying down. - feels like something heavy sitting on the chest - 10/10 when it occurs - associated with SOB, no sweating, lightheadedness - no radiation to arm, back, abdomen, or neck - never had similar pain in past - no medical attention for this pain in last 2 weeks - no similar pain in the life  Depression History:      The patient denies a depressed mood most of the day and a diminished interest in her usual daily activities.         Preventive Screening-Counseling & Management  Alcohol-Tobacco     Alcohol drinks/day: 0     Smoking Status: never   Cans of tobacco/week: yes  Caffeine-Diet-Exercise     Does Patient Exercise: no  Current Medications (verified): 1)  Calcium 600/vitamin D   Tabs (Calcium Carbonate-Vitamin D Tabs) .... Take 1 Tablet By Mouth Two Times A Day For Calcium 2)  Amlodipine Besylate 5 Mg Tabs (Amlodipine Besylate) .... Take 1 Tab By Mouth Once Daily 3)  Aricept 5 Mg Tabs (Donepezil Hcl) .... Take 1 Tab By Mouth At Bedtime 4)  Lisinopril 5 Mg Tabs (Lisinopril) .... Take 1 Tablet By Mouth Once A Day 5)  Tramadol Hcl 50 Mg Tabs (Tramadol Hcl) .... Take 1 Tab By Mouth Up To Three Times A Day As Needed For Pain.  Allergies (verified): 1)  ! Zocor  Review of Systems       The patient complains of chest pain, dyspnea on exertion, and headaches.  The patient denies anorexia, fever, weight loss, weight gain, vision loss, decreased hearing, hoarseness, syncope, peripheral edema, prolonged cough, hemoptysis, abdominal pain, melena, hematochezia, severe indigestion/heartburn, hematuria, incontinence, genital sores, muscle weakness, suspicious skin lesions, transient blindness, difficulty walking, depression, unusual weight change, abnormal bleeding, enlarged lymph nodes, angioedema, breast masses, and testicular masses.    Physical Exam  General:  Gen: VS reveiwed, Alert, well developed, nodistress ENT: mucous membranes pink & moist. No abnormal finds in ear and nose. CVC:S1 S2 , no murmurs, no abnormal heart sounds. Lungs: Clear to auscultation B/L. No wheezes, crackles or other abnormal sounds Abdomen: soft, non distended, no tender. Normal Bowel sounds EXT: no pitting edema, no engorged veins, Pulsations normal  Neuro:alert, oriented *3, cranial nerved 2-12 intact, strenght normal in all  extremities, senstations normal to light touch.    Chest Wall:  chest wall tenderness and costochondrial tenderness.     Impression & Recommendations:  Problem # 1:  CHEST PAIN (ICD-786.50) CP- story very suggestive for  ACS Physical exam- more like costochondirits At risk for PE given h/o breast cancer and immobility, but story more liek ACS or costochondiritis plan:  12 Lead EKG (12 Lead EKG) CXR- 2view (CXR)  Progress - no CXR finding of pneumonia - 12 lead EKGshows 1st degree AV bloc which is not new  plan - patient needs admission for CP rule out but refuses - will d/c with ASA+NTG and insturction to call EMS for CP - will also Rx ibuprofen three times a day for 5 days for possible costochondritis. 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 will reckced BMET at next viist. advise to take meds with food and keep well hydrated. -patient's grandson updated about this  Complete Medication List: 1)  Calcium 600/vitamin D Tabs (Calcium carbonate-vitamin d tabs) .... Take 1 tablet by mouth two times a day for calcium 2)  Amlodipine Besylate 5 Mg Tabs (Amlodipine besylate) .... Take 1 tab by mouth once daily 3)  Aricept 5 Mg Tabs (Donepezil hcl) .... Take 1 tab by mouth at bedtime 4)  Lisinopril 5 Mg Tabs (Lisinopril) .... Take 1 tablet by mouth once a day 5)  Tramadol Hcl 50 Mg Tabs (Tramadol hcl) .... Take 1 tab by mouth up to three times a day as needed for pain. 6)  Ibuprofen 400 Mg Tabs (Ibuprofen) .... Three times a day for 5 days 7)  Nitrostat 0.4 Mg Subl (Nitroglycerin) .Marland Kitchen.. 1 tab under the toungue for chest pain. 8)  Aspirin 81 Mg Chew (Aspirin) .... Take 1 tablet by mouth once a day  Patient Instructions: 1)  Please schedule a follow-up appointment in 2 weeks. 2)  Call the EMS if you are having chest pain Prescriptions: ASPIRIN 81 MG CHEW (ASPIRIN) Take 1 tablet by mouth once a day  #31 x 0   Entered and Authorized by:   Bethel Born MD   Signed by:   Bethel Born MD on 09/06/2010   Method used:   Print then Give to Patient   RxID:   [id number] NITROSTAT 0.4 MG SUBL (NITROGLYCERIN) 1 tab under the toungue for chest pain.  #30 x 0   Entered and Authorized by:   Bethel Born MD   Signed by:    Bethel Born MD on 09/06/2010   Method used:   Print then Give to Patient   RxID:   [id number] IBUPROFEN 400 MG TABS (IBUPROFEN) three times a day for 5 days  #15 x 0   Entered and Authorized by:   Bethel Born MD   Signed by:   Bethel Born MD on 09/06/2010   Method used:   Print then Give to Patient   RxID:   [id number]    Orders Added: 1)  12 Lead EKG [12 Lead EKG] 2)  CXR- 2view [CXR] 3)  Est. Patient Level V [01027]  Prevention & Chronic Care Immunizations   Influenza vaccine: Fluvax 3+  (01/26/2010)   Influenza vaccine due: 07/19/2009    Tetanus booster: Not documented   Td booster deferral: Not indicated  (02/28/2010)    Pneumococcal vaccine: given  (08/19/2005)    H. zoster vaccine: Not documented   H. zoster vaccine deferral: Not indicated  (02/28/2010)  Colorectal Screening   Hemoccult: Not documented   Hemoccult action/deferral: Not indicated  (02/28/2010)    Colonoscopy: Location:  [facility].    (07/21/2009)   Colonoscopy action/deferral: Deferred  (02/28/2010)  Other Screening   Pap smear: Refused  (08/19/2005)   Pap smear action/deferral: Refused  (02/28/2010)    Mammogram: ASSESSMENT: Negative - BI-RADS 1^MM DIGITAL SCREENING  (07/12/2010)   Mammogram action/deferral: Ordered  (06/22/2009)    DXA bone density scan: T-3.0 Left hip T-2.8 right hip T-2.1 L Spine  (04/22/2007)   DXA bone density action/deferral: Deferred  (06/18/2010)   DXA scan due: 04/2009    Smoking status: never  (09/06/2010)  Lipids   Total Cholesterol: 180  (03/01/2010)   LDL: 99  (03/01/2010)   LDL Direct: Not documented   HDL: 72  (03/01/2010)   Triglycerides: 45  (03/01/2010)    SGOT (AST): 22  (07/04/2009)   SGPT (ALT): 10  (07/04/2009)   Alkaline phosphatase: 45  (07/04/2009)   Total bilirubin: 1.2  (07/04/2009)  Hypertension   Last Blood Pressure: 134 / 73  (09/06/2010)   Serum creatinine: 1.12  (03/01/2010)   Serum  potassium 4.3  (03/01/2010)  Self-Management Support :   Personal Goals (by the next clinic visit) :      Personal blood pressure goal: 140/90  (12/14/2009)     Personal LDL goal: 100  (12/14/2009)    Patient will work on the following items until the next clinic visit to reach self-care goals:     Medications and monitoring: take my medicines every day  (09/06/2010)     Eating: drink diet soda or water instead of juice or soda, eat more vegetables, use fresh or frozen vegetables, eat foods that are low in salt, eat baked foods instead of fried foods, eat fruit for snacks and desserts  (09/06/2010)     Activity: take a 30 minute walk every day  (03/29/2010)     Other: not walking or exercising - states right foot pain and knee pain had stopped her in past but were better now  (12/14/2009)    Hypertension self-management support: Education handout, Resources for patients handout  (09/06/2010)   Hypertension education handout printed    Lipid self-management support: Education handout, Resources for patients handout  (09/06/2010)     Lipid education handout printed      Resource handout printed.

## 2010-12-18 NOTE — Assessment & Plan Note (Signed)
Summary: est-ck/fu/meds/cfb   Vital Signs:  Patient profile:   77 year old female Height:      67 inches Weight:      122.3 pounds Temp:     97.7 degrees F oral Pulse rate:   79 / minute BP sitting:   172 / 85  (right arm)  Vitals Entered By: Filomena Jungling NT II (November 30, 2009 4:22 PM) CC: follow-up  visit , check foot, knee, shoulder, Depression Is Patient Diabetic? No Pain Assessment Patient in pain? yes     Location: knee, shoulder and foot Intensity: 5 Type: aching Onset of pain  Intermittent  Have you ever been in a relationship where you felt threatened, hurt or afraid?No   Does patient need assistance? Functional Status Self care Ambulation Normal   Primary Care Provider:  Brooks Sailors MD  CC:  follow-up  visit , check foot, knee, shoulder, and Depression.  History of Present Illness: Pt is an 77 yo F with h/o HLD, anemia, TIA, RA, h/o breast Ca,  who is here for f/u.  She reports that she has a HA, right shoulder pain, and a right foot lesion.  She reports that she has had a HA for several weeks.  Pain is located in bilateral temples.  HA is worst at night.  She goes to sleep and when she wakes up it is gone.  It is there almost every night.  Her vision has not changed.  Denies jaw pain.  She takes tylenol which helps her pain.   She denies focal weakness or numbness.  Denies nausea/vomiting. Pain in right shoulder radiates down into her arm.  Whole arm aches. Tylenol helps a little bit.  Pain is worse when moving arm and is a little worse with walking.  Pain gets much worse when she reaches for something.   Also has pain in her right knee. Worse when walking.  Pain is constantly there, does not radiate. Pt noticed a lesion on the bottom of her right foot that is not painful.  It is on the ball of her right foot.  She noticed it when she was washing her feet.  She reports having chills but denies fevers. Her mood is labile.  Somedays she feels happy, she still looks  forward to things in her life. Pt lives with her daughter, but she is unhappy with this arrangement.  She feels safe at home.  Depression History:      The patient denies a depressed mood most of the day and a diminished interest in her usual daily activities.         Preventive Screening-Counseling & Management  Alcohol-Tobacco     Alcohol drinks/day: 0     Smoking Status: never     Cans of tobacco/week: yes  Caffeine-Diet-Exercise     Does Patient Exercise: no  Current Medications (verified): 1)  Calcium 600/vitamin D   Tabs (Calcium Carbonate-Vitamin D Tabs) .... Take 1 Tablet By Mouth Two Times A Day For Calcium 2)  Amlodipine Besylate 10 Mg  Tabs (Amlodipine Besylate) .... Take 1 Tablet By Mouth Once A Day 3)  Cyanocobalamin 1000 Mcg/ml Soln (Cyanocobalamin) .... Please Give Injection Once Weekly For One Month, and Then Once Monthly 4)  Tylenol 325 Mg Tabs (Acetaminophen) .... Take 1 Tab By Mouth Once Every 4 Hours As Needed For Pain 5)  Aricept 5 Mg Tabs (Donepezil Hcl) .... Take 1 Tab By Mouth At Bedtime 6)  Lisinopril 5 Mg Tabs (Lisinopril) .... Take  1 Tablet By Mouth Once A Day  Allergies (verified): 1)  ! Zocor  Review of Systems       The patient complains of headaches.  The patient denies anorexia, fever, weight loss, vision loss, decreased hearing, chest pain, syncope, dyspnea on exertion, peripheral edema, abdominal pain, melena, and hematochezia.    Physical Exam  General:  alert and well-developed.  very thin Head:  normocephalic and atraumatic.   Eyes:  vision grossly intact and pupils reactive to light.  left pupil is slightly peaked at 11 o'clock Mouth:  no gingival abnormalities and pharynx pink and moist.   Neck:  no LAD, no masses Chest Wall:  hyperactive precordium Lungs:  normal respiratory effort, no crackles, and no wheezes.   Heart:  normal rate, regular rhythm, no murmur, no gallop, no rub, and no JVD.   Abdomen:  soft, non-tender, normal bowel  sounds, no distention, no masses, no guarding, no rigidity, and no rebound tenderness.   Msk:  right shoulder ROM limited by pain, worse with internal and external rotation, and abduction.  no point tenderness at shoulder joint; crusting hyperkeratotic lesion under first MTP joint on right foot, no tenderness, no drainage. cutaneous edema over right knee, no joint space effusion. Extremities:  no edema Neurologic:  alert & oriented X3 and sensation intact to light touch.   Psych:  not anxious appearing and not depressed appearing.     Impression & Recommendations:  Problem # 1:  SHOULDER PAIN, RIGHT (ICD-719.41) Pt's right shoulder pain likely related to her degenerative joint disease.   Her rheumatoid arthritis does not seem to be active, given the lack of acutely inflammed joints on exam and the fact that her pain is mostly controlled with Tylenol alone.  Pt was told to continue tylenol use for her pain and was offered joint injections which she did not want at this time.  She was told that this could be done in the future if the pain continued or worsened.  NSAID's should be avoided given her renal dysfunction and EGD proven gastritis. Her updated medication list for this problem includes:    Tylenol 325 Mg Tabs (Acetaminophen) .Marland Kitchen... Take 1 tab by mouth once every 4 hours as needed for pain  Problem # 2:  HYPERTENSION (ICD-401.9) Pt's BP not at goal today.  Unfortunately pt was in a rush to leave so no changes could be made to her regimen and no labs could be drawn.  AT next visit I will recheck BP, check her renal function and consider increasing her Lisinopril if her kidneys can tolerate it.   Her updated medication list for this problem includes:    Amlodipine Besylate 10 Mg Tabs (Amlodipine besylate) .Marland Kitchen... Take 1 tablet by mouth once a day    Lisinopril 5 Mg Tabs (Lisinopril) .Marland Kitchen... Take 1 tablet by mouth once a day  Problem # 3:  HEADACHE (ICD-784.0) Her HA seems most consistent with  tension type HA's or medication rebound HA's.  Temporal ar teritis was considered but is very unlikely given the lack of temporal tenderness, lack of jaw claudication symptoms, lack of vision change.  ALso her HA's always respond to Tylenol which would be unlikely for GCA.  Pt had no neurological deficits. Her updated medication list for this problem includes:    Tylenol 325 Mg Tabs (Acetaminophen) .Marland Kitchen... Take 1 tab by mouth once every 4 hours as needed for pain  Problem # 4:  KNEE PAIN, RIGHT, CHRONIC (ICD-719.46) Her knee did  not seem acutely inflammed.  There was no joint effusion. Pt told to continue Tylenol. Her updated medication list for this problem includes:    Tylenol 325 Mg Tabs (Acetaminophen) .Marland Kitchen... Take 1 tab by mouth once every 4 hours as needed for pain  Problem # 5:  VITAMIN B12 DEFICIENCY (ICD-266.2) It is uncertain whether pt is still getting any B12 repletion injections.  B12 level should be checked at next visit.  Problem # 6:  DISORDER, DYSTHYMIC (ICD-300.4) Pt reports that her mood has been mostly good with some occasional depressed mood and irritability.  Does not meet criteria for MDD.  Does not wish to have any treatment currently.  Problem # 7:  SKIN LESION (ICD-709.9)  Pt has a hyperkeratotic appearing lesion on the first MTP joint on plantar surface of right foot.  She does not have any pain from this lesion.  I think this may be a plantar wart.  I would like pt to see a podiatrist to give recommendations about local care and full foot exam.  Orders: Podiatry Referral (Podiatry)  Complete Medication List: 1)  Calcium 600/vitamin D Tabs (Calcium carbonate-vitamin d tabs) .... Take 1 tablet by mouth two times a day for calcium 2)  Amlodipine Besylate 10 Mg Tabs (Amlodipine besylate) .... Take 1 tablet by mouth once a day 3)  Cyanocobalamin 1000 Mcg/ml Soln (Cyanocobalamin) .... Please give injection once weekly for one month, and then once monthly 4)  Tylenol 325 Mg  Tabs (Acetaminophen) .... Take 1 tab by mouth once every 4 hours as needed for pain 5)  Aricept 5 Mg Tabs (Donepezil hcl) .... Take 1 tab by mouth at bedtime 6)  Lisinopril 5 Mg Tabs (Lisinopril) .... Take 1 tablet by mouth once a day  Patient Instructions: 1)  Please schedule a follow-up appointment in 2 weeks, please make appointment for earlier in the day. 2)  Keep taking all of your medicines as you have been. 3)  I will check some labs when you come back.    Appended Document: Orders Update    Clinical Lists Changes  Orders: Added new Test order of T-Basic Metabolic Panel [phone]) - Signed Added new Test order of T-Vitamin B12 [phone]) - Signed
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
## 2010-12-18 NOTE — Discharge Summary (Signed)
Summary: Hospital Discharge Update    Hospital Discharge Update:  Date of Admission: 09/09/2010 Date of Discharge: 09/11/2010  Brief Summary:  Pt is a 77yo female with PMHx of osteoporosis, diverticulosis, htn, renal insufficiency who presented to [facility] ED with 2 week hx of left sided chest pain and increased weakness and dizziness x1 month. On admission Hgb was 5.2, and was FOBT (+). B12, folate, tsh normal. EGD performed on 09/10/10 showed gastric antral vascular ectasia (GAVE), which is thought to be cause of chronic iron-def anemia and FOBT. GI added Ferrous sulfate BID indefinitely, d/c protonix, and recommended close monitoring of H/H (likely q month per Dr. Phillips Odor). Pt may require outpatient transfusion on occasion. Pt had mild ST depression and increased troponins on admission (0.3--> 0.4--> 0.35) which were thought to be 2/2 to demand ischemia 2/2 severe anemia. Pt is without cp at discharge. Pt also has BL dementia, which was unknown to [place] during hospital course, therefore UA, Ucx, TSH, electrolytes checked. UA, TSH wnl. Ucx pending. Pt will go home with [facility] RN and [facility] PT for help with medical management and conditioning.  Lab or other results pending at discharge:  Urine culture  Other labs needed at follow-up: CBC  Problem list changes:  Added new problem of ANGIODYSPLASIA OF STOMACH&DUODENUM W/HEMORRHAGE (ZOX-096.04) - Signed  Medication list changes:  Added new medication of FERROUS SULFATE 325 (65 FE) MG TABS (FERROUS SULFATE) Take 1 tablet by mouth two times a day - Signed Removed medication of IBUPROFEN 400 MG TABS (IBUPROFEN) three times a day for 5 days - Signed Rx of FERROUS SULFATE 325 (65 FE) MG TABS (FERROUS SULFATE) Take 1 tablet by mouth two times a day;  #60 x 5;  Signed;  Entered by: Johnette Abraham DO;  Authorized by: Johnette Abraham DO;  Method used: Print then Give to Patient Rx of AMLODIPINE BESYLATE 5 MG TABS (AMLODIPINE BESYLATE) Take 1 tab by mouth  once daily;  #30 x 5;  Signed;  Entered by: Johnette Abraham DO;  Authorized by: Johnette Abraham DO;  Method used: Print then Give to Patient Rx of LISINOPRIL 5 MG TABS (LISINOPRIL) Take 1 tablet by mouth once a day;  #30 x 5;  Signed;  Entered by: Johnette Abraham DO;  Authorized by: Johnette Abraham DO;  Method used: Print then Give to Patient  Discharge medications:  CALCIUM 600/VITAMIN D   TABS (CALCIUM CARBONATE-VITAMIN D TABS) Take 1 tablet by mouth two times a day for calcium AMLODIPINE BESYLATE 5 MG TABS (AMLODIPINE BESYLATE) Take 1 tab by mouth once daily ARICEPT 5 MG TABS (DONEPEZIL HCL) Take 1 tab by mouth at bedtime LISINOPRIL 5 MG TABS (LISINOPRIL) Take 1 tablet by mouth once a day TRAMADOL HCL 50 MG TABS (TRAMADOL HCL) Take 1 tab by mouth up to three times a day as needed for pain. NITROSTAT 0.4 MG SUBL (NITROGLYCERIN) 1 tab under the toungue for chest pain. ASPIRIN 81 MG CHEW (ASPIRIN) Take 1 tablet by mouth once a day FERROUS SULFATE 325 (65 FE) MG TABS (FERROUS SULFATE) Take 1 tablet by mouth two times a day  Other patient instructions:  Please follow-up at the clinicon 09/19/2010 at 09:30AM which time we will reevaluate your chest pain and bleeding.  You have been started on ferrous sulfate (iron) that you should take twice daily. It is very important for you to continue on this medication long-term, so please take it regularly. We are going to help to arrange for a home health nurse and  physical therapist to come out to your home to help you with managing your medications, and help you with getting your strength up. Please let us know how this is working out for you, when you follow up with [place] in the clinic.  If you have worsening of your symptoms, develop chest pain, difficulty breathing, weakness, please call the clinic at [phone] or go to the ER.   Note: Hospital Discharge Medications & Other Instructions handout was printed, one copy for patient and  a second copy to be placed in hospital chart.  Prescriptions: LISINOPRIL 5 MG TABS (LISINOPRIL) Take 1 tablet by mouth once a day  #30 x 5   Entered and Authorized by:   Johnette Abraham DO   Signed by:   Johnette Abraham DO on 09/11/2010   Method used:   Print then Give to Patient   RxID:   [id number] AMLODIPINE BESYLATE 5 MG TABS (AMLODIPINE BESYLATE) Take 1 tab by mouth once daily  #30 x 5   Entered and Authorized by:   Johnette Abraham DO   Signed by:   Johnette Abraham DO on 09/11/2010   Method used:   Print then Give to Patient   RxID:   [id number] FERROUS SULFATE 325 (65 FE) MG TABS (FERROUS SULFATE) Take 1 tablet by mouth two times a day  #60 x 5   Entered and Authorized by:   Johnette Abraham DO   Signed by:   Johnette Abraham DO on 09/11/2010   Method used:   Print then Give to Patient   RxID:   [id number]

## 2010-12-18 NOTE — Progress Notes (Signed)
Summary: PT/ hla  Phone Note Other Incoming   Summary of Call: mary, [facility] PT states that pt is refusing home physical therapy Initial call taken by: Marin Roberts RN,  September 21, 2010 3:08 PM  Follow-up for Phone Call        noted.  thanks Follow-up by: Lars Mage MD,  September 24, 2010 1:39 PM

## 2010-12-18 NOTE — Assessment & Plan Note (Signed)
Summary: est-ck/fu/meds/cfb   Vital Signs:  Patient profile:   77 year old female Height:      65.5 inches (166.37 cm) Weight:      123.3 pounds (56.05 kg) BMI:     20.28 Temp:     99.0 degrees F (37.22 degrees C) oral Pulse rate:   81 / minute BP sitting:   145 / 72  (left arm) Cuff size:   regular  Vitals Entered By: Cynda Familia Duncan Dull) (June 18, 2010 3:07 PM) CC: knee pain and HA Is Patient Diabetic? No Pain Assessment Patient in pain? yes     Location: right knee Intensity: 7 Type: sharp Onset of pain  Chronic Nutritional Status BMI of 25 - 29 = overweight  Have you ever been in a relationship where you felt threatened, hurt or afraid?No   Does patient need assistance? Functional Status Self care Ambulation Impaired:Risk for fall Comments cane   Primary Care Provider:  Lars Mage MD  CC:  knee pain and HA.  History of Present Illness: Patient is a 77 year old women with PMH as described in EMR  is here today for a regular follow up. I saw the patient in March.  BP is well controlled. It is slightly above goal today.  Knee pain: She complains her knee pain is not well controlled with tylenol and tramadol that she takes. She used to see a foot doctor and she does not remember the name of him. She uses a cane for walking.   She also complains about HA which is one of her chronic complaints. I asked her to take tylenol and Tramadol for it as required.  Vit B12 def: She is not taking her Vit B12 shots anymore becuas of the pain associated with it.   He denies any new sicknesses or hospitalizations, no chest pain episodes, no fevers, no chills, no abdominal or urinary concerns. No recent changes in appetite, weight.        Problems Prior to Update: 1)  Orthostatic Dizziness  (ICD-780.4) 2)  Skin Lesion  (ICD-709.9) 3)  Shoulder Pain, Right  (ICD-719.41) 4)  Gastritis  (ICD-535.50) 5)  Guaiac Positive Stool  (ICD-578.1) 6)  Av Block, 1st Degree   (ICD-426.11) 7)  Tachycardia  (ICD-785.0) 8)  Hyperlipidemia  (ICD-272.4) 9)  Hallucinations  (ICD-780.1) 10)  Vitamin B12 Deficiency  (ICD-266.2) 11)  Unspecified Anemia  (ICD-285.9) 12)  Health Screening  (ICD-V70.0) 13)  Shoulder Pain, Left  (ICD-719.41) 14)  Tia  (ICD-435.9) 15)  Knee Pain, Right, Chronic  (ICD-719.46) 16)  Osteoporosis  (ICD-733.00) 17)  Symptom, Memory Loss  (ICD-780.93) 18)  Degenerative Joint Disease, Knee  (ICD-715.96) 19)  Breast Mass, Left  (ICD-611.72) 20)  Family History of Cad Female 1st Degree Relative <60  (ICD-V16.49) 21)  Anemia, Folate-deficiency  (ICD-281.2) 22)  Disorder, Dysthymic  (ICD-300.4) 23)  Cholecystectomy, Hx of  (ICD-V45.79) 24)  Appendectomy, Hx of  (ICD-V45.79) 25)  Cataract Extraction, Right Eye, Hx of  (ICD-V45.61) 26)  Breast Cancer, Hx of  (ICD-V10.3) 27)  Renal Insufficiency  (ICD-588.9) 28)  Hypertension  (ICD-401.9) 29)  Headache  (ICD-784.0) 30)  Rheumatoid Arthritis  (ICD-714.0)  Medications Prior to Update: 1)  Calcium 600/vitamin D   Tabs (Calcium Carbonate-Vitamin D Tabs) .... Take 1 Tablet By Mouth Two Times A Day For Calcium 2)  Amlodipine Besylate 5 Mg Tabs (Amlodipine Besylate) .... Take 1 Tab By Mouth Once Daily 3)  Cyanocobalamin 1000 Mcg/ml Soln (Cyanocobalamin) .... Please  Give Injection Once Weekly For One Month, and Then Once Monthly 4)  Aricept 5 Mg Tabs (Donepezil Hcl) .... Take 1 Tab By Mouth At Bedtime 5)  Lisinopril 5 Mg Tabs (Lisinopril) .... Take 1 Tablet By Mouth Once A Day 6)  Tramadol Hcl 50 Mg Tabs (Tramadol Hcl) .... Take 1 Tab By Mouth Up To Three Times A Day As Needed For Pain. 7)  Syringe 25g X 5/8" 3 Ml Misc (Syringe/needle (Disp)) .... Use Syringes As Directed By The Clinic Nurse To Inject Vit B12 Every Month.  Current Medications (verified): 1)  Calcium 600/vitamin D   Tabs (Calcium Carbonate-Vitamin D Tabs) .... Take 1 Tablet By Mouth Two Times A Day For Calcium 2)  Amlodipine Besylate 5  Mg Tabs (Amlodipine Besylate) .... Take 1 Tab By Mouth Once Daily 3)  Aricept 5 Mg Tabs (Donepezil Hcl) .... Take 1 Tab By Mouth At Bedtime 4)  Lisinopril 5 Mg Tabs (Lisinopril) .... Take 1 Tablet By Mouth Once A Day 5)  Tramadol Hcl 50 Mg Tabs (Tramadol Hcl) .... Take 1 Tab By Mouth Up To Three Times A Day As Needed For Pain.  Allergies: 1)  ! Zocor  Past History:  Past Medical History: Last updated: 09/02/2008 Rheumatoid arthritis-Previously on MTX x1 yr, failed appropriate f/u. Headache Hypertension TIA, declines aggrenox Renal insufficiency-Baseline 1.3 Breast cancer, hx of DCIS 12/99,       Completed XRT 2/2//00-3/17/00, Tamoxifen 25m once daily 3/00-06/06      All MMG since then have been negative, last 04/09 Depression, improved with remeron Macrocytic Anemia-Folic Def 2/2 likely MTX Osteoporosis-T Score -3.0 right hip, -2.8 left hip and -2.1 Lumbar spine(4 June 08).  Not on bisphosphonate b/c of renal insufficiency  Past Surgical History: Last updated: 03/25/2007 Appendectomy Cholecystectomy Lung-wedge resection LUL with Video thorascopy benign granumloma 04/25/00  Family History: Last updated: 03/25/2007 Family History of CAD Female 1st degree relative <60-Mom died at 4 Sister died with cancer Brother died with DM  Social History: Last updated: 07/04/2009 Recently had to move in with her daughter, she states because of problems with her neighbors and because her son moved out 2 daughters, one deceased from DM at age 75 Widowed Alcohol use-no  Risk Factors: Alcohol Use: 0 (03/29/2010) Exercise: no (03/29/2010)  Risk Factors: Smoking Status: never (03/29/2010) Cans of tobacco/wk: yes (03/29/2010)  Family History: Reviewed history from 03/25/2007 and no changes required. Family History of CAD Female 1st degree relative <60-Mom died at 74 Sister died with cancer Brother died with DM  Social History: Reviewed history from 07/04/2009 and no changes  required. Recently had to move in with her daughter, she states because of problems with her neighbors and because her son moved out 2 daughters, one deceased from DM at age 8 Widowed Alcohol use-no  Review of Systems      See HPI  Physical Exam  Additional Exam:  Gen: AOx3, in no acute distress, frail looking women Eyes: PERRL, EOMI ENT:MMM, No erythema noted in posterior pharynx Neck: No JVD, No LAP Chest: CTAB with  good respiratory effort CVS: regular rhythmic rate, NO M/R/G, S1 S2 normal Abdo: soft,ND, BS+x4, Non tender and No hepatosplenomegaly EXT: No odema noted Neuro: Non focal Skin: no rashes noted.    Impression & Recommendations:  Problem # 1:  HYPERTENSION (ICD-401.9) Assessment Unchanged No changes required today. I will put her on any more meds for marginal increase in BP and would follow her. Crt and K are recent. Follow up in  3 months.  Her updated medication list for this problem includes:    Amlodipine Besylate 5 Mg Tabs (Amlodipine besylate) .Marland Kitchen... Take 1 tab by mouth once daily    Lisinopril 5 Mg Tabs (Lisinopril) .Marland Kitchen... Take 1 tablet by mouth once a day  BP today: 145/72 Prior BP: 135/77 (03/29/2010)  Labs Reviewed: K+: 4.3 (03/01/2010) Creat: : 1.12 (03/01/2010)   Chol: 180 (03/01/2010)   HDL: 72 (03/01/2010)   LDL: 99 (03/01/2010)   TG: 45 (03/01/2010)  Problem # 2:  RHEUMATOID ARTHRITIS (ICD-714.0) Assessment: Deteriorated I asked her to do heating pad messages to improve her pain control and also ask her to use tramadol and tylenol as needed.  Problem # 3:  VITAMIN B12 DEFICIENCY (ICD-266.2) Assessment: Unchanged Patient refused to take any shots at this time. I would start her on Oral VitB12 in future if she refuses to take injections at next visit.  Problem # 4:  Preventive Health Care (ICD-V70.0) Assessment: Comment Only Up to date at this time.  Complete Medication List: 1)  Calcium 600/vitamin D Tabs (Calcium carbonate-vitamin d  tabs) .... Take 1 tablet by mouth two times a day for calcium 2)  Amlodipine Besylate 5 Mg Tabs (Amlodipine besylate) .... Take 1 tab by mouth once daily 3)  Aricept 5 Mg Tabs (Donepezil hcl) .... Take 1 tab by mouth at bedtime 4)  Lisinopril 5 Mg Tabs (Lisinopril) .... Take 1 tablet by mouth once a day 5)  Tramadol Hcl 50 Mg Tabs (Tramadol hcl) .... Take 1 tab by mouth up to three times a day as needed for pain.  Patient Instructions: 1)  Please use hot water bottle/ heating pad on the affected knee joint for relief of joint pain. 2)  Take 650-1000mg  of Tylenol every 4-6 hours as needed for relief of pain AVOID taking more than 4000mg   in a 24 hour period (can cause liver damage in higher doses). 3)  Please schedule a follow-up appointment in 3 months. 4)  BMP prior to visit, ICD-9: 5)  Lipid Panel prior to visit, ICD-9:  Prevention & Chronic Care Immunizations   Influenza vaccine: Fluvax 3+  (01/26/2010)   Influenza vaccine due: 07/19/2009    Tetanus booster: Not documented   Td booster deferral: Not indicated  (02/28/2010)    Pneumococcal vaccine: given  (08/19/2005)    H. zoster vaccine: Not documented   H. zoster vaccine deferral: Not indicated  (02/28/2010)  Colorectal Screening   Hemoccult: Not documented   Hemoccult action/deferral: Not indicated  (02/28/2010)    Colonoscopy: Location:   [facility].    (07/21/2009)   Colonoscopy action/deferral: Deferred  (02/28/2010)  Other Screening   Pap smear: Refused  (08/19/2005)   Pap smear action/deferral: Refused  (02/28/2010)    Mammogram: ASSESSMENT: Negative - BI-RADS 1^MM DIGITAL SCREENING  (07/11/2009)   Mammogram action/deferral: Ordered  (06/22/2009)    DXA bone density scan: T-3.0 Left hip T-2.8 right hip T-2.1 L Spine  (04/22/2007)   DXA bone density action/deferral: Deferred  (06/18/2010)   DXA scan due: 04/2009    Smoking status: never  (03/29/2010)  Lipids   Total Cholesterol: 180   (03/01/2010)   LDL: 99  (03/01/2010)   LDL Direct: Not documented   HDL: 72  (03/01/2010)   Triglycerides: 45  (03/01/2010)    SGOT (AST): 22  (07/04/2009)   SGPT (ALT): 10  (07/04/2009)   Alkaline phosphatase: 45  (07/04/2009)   Total bilirubin: 1.2  (07/04/2009)    Lipid flowsheet  reviewed?: Yes   Progress toward LDL goal: At goal  Hypertension   Last Blood Pressure: 145 / 72  (06/18/2010)   Serum creatinine: 1.12  (03/01/2010)   Serum potassium 4.3  (03/01/2010)    Hypertension flowsheet reviewed?: Yes   Progress toward BP goal: At goal  Self-Management Support :   Personal Goals (by the next clinic visit) :      Personal blood pressure goal: 140/90  (12/14/2009)     Personal LDL goal: 100  (12/14/2009)    Hypertension self-management support: Written self-care plan  (06/18/2010)   Hypertension self-care plan printed.    Lipid self-management support: Written self-care plan  (06/18/2010)   Lipid self-care plan printed.

## 2010-12-18 NOTE — Assessment & Plan Note (Signed)
Summary: 2 week f/u/cfb   Vital Signs:  Patient profile:   77 year old female Height:      65.5 inches (166.37 cm) Weight:      118.9 pounds (54.05 kg) Temp:     97.1 degrees F Pulse rate:   79 / minute Pulse (ortho):   95 / minute BP sitting:   113 / 74  (right arm) BP standing:   134 / 80  (right arm) Cuff size:   regular  Vitals Entered By: Dorie Rank RN (December 14, 2009 1:32 PM) CC: check up- has seen podiatrist Is Patient Diabetic? No Pain Assessment Patient in pain? no      Nutritional Status BMI of 19 -24 = normal Nutritional Status Detail 118.9 #, ht 65.5 - BMI will not calculate at present states just has not been hungry "bad Christmas...husband was not with me, older dtr was not with me"  Have you ever been in a relationship where you felt threatened, hurt or afraid?No   Does patient need assistance? Functional Status Cook/clean, Shopping, Social activities Ambulation Impaired:Risk for fall Comments dtr cooks and cleans - brings meals to pt in bed - uneven halting gait - able to feed and dress self - needs asst with other ADL   Primary Care Provider:  Brooks Sailors MD  CC:  check up- has seen podiatrist.  History of Present Illness: Pt is a 77 yo F with pmh sig for HTN, Anemia, rheumatoid arthritis here for f/u.  She says that she saw the podiatrist and he took the lesion off of her foot.  The pain in her foot is now gone and her knee pain has improved as a result.  She is continuing to have some pain in her shoulder but the tylenol she takes helps.  She says that she had a worsened mood around Christmas because she was thinking a lot about  her deceased loved ones.   She is feeling better now.  Pt reports that she has been feeling dizzy when she sits up or stands up.  She has fallen back onto the bed, but she has not injured herself.  She says it is a feeling of weakness or dizziness she is unsure.  Current Medications (verified): 1)  Calcium 600/vitamin D    Tabs (Calcium Carbonate-Vitamin D Tabs) .... Take 1 Tablet By Mouth Two Times A Day For Calcium 2)  Amlodipine Besylate 10 Mg  Tabs (Amlodipine Besylate) .... Take 1 Tablet By Mouth Once A Day 3)  Cyanocobalamin 1000 Mcg/ml Soln (Cyanocobalamin) .... Please Give Injection Once Weekly For One Month, and Then Once Monthly 4)  Tylenol 325 Mg Tabs (Acetaminophen) .... Take 1 Tab By Mouth Once Every 4 Hours As Needed For Pain 5)  Aricept 5 Mg Tabs (Donepezil Hcl) .... Take 1 Tab By Mouth At Bedtime 6)  Lisinopril 5 Mg Tabs (Lisinopril) .... Take 1 Tablet By Mouth Once A Day  Allergies (verified): 1)  ! Zocor  Review of Systems       The patient complains of weight loss and chest pain.  The patient denies anorexia, fever, vision loss, decreased hearing, syncope, peripheral edema, prolonged cough, abdominal pain, hematochezia, and abnormal bleeding.    Physical Exam  General:  alert and well-developed.   Head:  normocephalic and atraumatic.   Eyes:  vision grossly intact and pupils equal.  post surgical iris changes Nose:  no external deformity.   Mouth:  no gingival abnormalities and pharynx pink and moist.  Neck:  supple.   Lungs:  normal respiratory effort, normal breath sounds, no crackles, and no wheezes.   Heart:  normal rate, regular rhythm, no murmur, no gallop, no rub, and no JVD.   Abdomen:  soft, non-tender, normal bowel sounds, no distention, no masses, no guarding, no rigidity, and no rebound tenderness.   Msk:  some mild tenderness over right shoulder, full range of motion Extremities:  left foot lesion is healing well, no drainage, no erythema. Neurologic:  alert & oriented X3, cranial nerves II-XII intact, and sensation intact to light touch.     Impression & Recommendations:  Problem # 1:  HYPERTENSION (ICD-401.9) Pt BP was finally at goal today but as she reports that she has been experiencing some dizzinss with standing and sitting up I am concerned that her dose may be  too high.  I will reduce the dose of Amlodipine but am anticipating a rise in her BP.  This will be a delicate balance try to get these 2 things under control.  I will check a B-met today to eval her K and renal function. Her updated medication list for this problem includes:    Amlodipine Besylate 5 Mg Tabs (Amlodipine besylate) .Marland Kitchen... Take 1 tab by mouth once daily    Lisinopril 5 Mg Tabs (Lisinopril) .Marland Kitchen... Take 1 tablet by mouth once a day  Problem # 2:  ORTHOSTATIC DIZZINESS (ICD-780.4) I am decreasing her amlodipine b/c I am concerned that her dizziness is orthostatic.  Problem # 3:  VITAMIN B12 DEFICIENCY (ICD-266.2) Pt is unsure if she ever received B12 injections.  I will recheck her level today and replete as necessary.  Problem # 4:  RENAL INSUFFICIENCY (ICD-588.9) Checking a B-Met today to evaluste.  She is taking an ACE-I.  Problem # 5:  SKIN LESION (ICD-709.9) Pt's foot looks much better today.  The lesion is healing well.  Problem # 6:  KNEE PAIN, RIGHT, CHRONIC (ICD-719.46) She reports that the Tylenol mostly helps for both her knee and right shoulder pain.  She was told she could take a higher dose of the Tylenol to try to get things completely under control. Her updated medication list for this problem includes:    Tylenol 325 Mg Tabs (Acetaminophen) .Marland Kitchen... Take 2 tab by mouth once every 4 hours as needed for pain  Problem # 7:  SHOULDER PAIN, RIGHT (ICD-719.41) I have increased the dose of her tylenol.  SHe has only been taking 325 mg two times a day.  She does not wish to have injections in her shouder at this time. Her updated medication list for this problem includes:    Tylenol 325 Mg Tabs (Acetaminophen) .Marland Kitchen... Take 2 tab by mouth once every 4 hours as needed for pain  Problem # 8:  DISORDER, DYSTHYMIC (ICD-300.4) Pt reports a worsened mood around X-mas time b/c she was thinking a lot about her deceased husband and children.  She also has a lot of stress about living  with her daughter.  She had enquired about living in a facility in the past, but the info we sent to her house was apparently received by her daughter who was angered by it.  This needs to be discussed with the pt on a one-on-one face to face basis at next visit.  She made need to see Dorothe Pea at that time.  She does not want her daughter to know about this.  Complete Medication List: 1)  Calcium 600/vitamin D Tabs (Calcium carbonate-vitamin  d tabs) .... Take 1 tablet by mouth two times a day for calcium 2)  Amlodipine Besylate 5 Mg Tabs (Amlodipine besylate) .... Take 1 tab by mouth once daily 3)  Cyanocobalamin 1000 Mcg/ml Soln (Cyanocobalamin) .... Please give injection once weekly for one month, and then once monthly 4)  Tylenol 325 Mg Tabs (Acetaminophen) .... Take 2 tab by mouth once every 4 hours as needed for pain 5)  Aricept 5 Mg Tabs (Donepezil hcl) .... Take 1 tab by mouth at bedtime 6)  Lisinopril 5 Mg Tabs (Lisinopril) .... Take 1 tablet by mouth once a day  Patient Instructions: 1)  Please schedule a follow-up appointment in 1 month. 2)  You will satrt taking less of the amlodipine because I am concerned that that is making you dizzy. 3)  You take 2 Tylenols up to 6 times a day for your pain. 4)  You will have somes labs checked today. I will call you if there is anything in the results that needs to be addressed.  Prescriptions: AMLODIPINE BESYLATE 5 MG TABS (AMLODIPINE BESYLATE) Take 1 tab by mouth once daily  #30 x 2   Entered and Authorized by:   Brooks Sailors MD   Signed by:   Brooks Sailors MD on 12/14/2009   Method used:   Electronically to        [facility] [phone]* (retail)       [address], [place]  [address]       Ph: [phone] or [phone]       Fax: [phone]   RxID:   [id number]    Prevention & Chronic Care Immunizations   Influenza vaccine: refuses  (09/25/2007)   Influenza vaccine due: 07/19/2009    Tetanus booster: Not  documented    Pneumococcal vaccine: given  (08/19/2005)    H. zoster vaccine: Not documented  Colorectal Screening   Hemoccult: Not documented    Colonoscopy: Location:  [facility].    (07/21/2009)  Other Screening   Pap smear: Refused  (08/19/2005)    Mammogram: ASSESSMENT: Negative - BI-RADS 1^MM DIGITAL SCREENING  (07/11/2009)   Mammogram action/deferral: Ordered  (06/22/2009)    DXA bone density scan: T-3.0 Left hip T-2.8 right hip T-2.1 L Spine  (04/22/2007)   DXA scan due: 04/2009    Smoking status: never  (11/30/2009)  Lipids   Total Cholesterol: 184  (02/07/2009)   LDL: 97  (02/07/2009)   LDL Direct: Not documented   HDL: 70  (02/07/2009)   Triglycerides: 85  (02/07/2009)    SGOT (AST): 22  (07/04/2009)   SGPT (ALT): 10  (07/04/2009)   Alkaline phosphatase: 45  (07/04/2009)   Total bilirubin: 1.2  (07/04/2009)    Lipid flowsheet reviewed?: Yes   Progress toward LDL goal: Unchanged  Hypertension   Last Blood Pressure: 113 / 74  (12/14/2009)   Serum creatinine: 1.0  (07/04/2009)   Serum potassium 3.7  (07/04/2009)    Hypertension flowsheet reviewed?: Yes   Progress toward BP goal: At goal  Self-Management Support :   Personal Goals (by the next clinic visit) :      Personal blood pressure goal: 140/90  (12/14/2009)     Personal LDL goal: 100  (12/14/2009)    Patient will work on the following items until the next clinic visit to reach self-care goals:     Medications and monitoring: take my medicines every day, bring all of  my medications to every visit  (12/14/2009)     Eating: drink diet soda or water instead of juice or soda, eat more vegetables, eat foods that are low in salt, eat baked foods instead of fried foods  (12/14/2009)     Other: not walking or exercising - states right foot pain and knee pain had stopped her in past but were better now  (12/14/2009)    Hypertension self-management support: Written self-care plan   (12/14/2009)   Hypertension self-care plan printed.    Lipid self-management support: Written self-care plan  (12/14/2009)   Lipid self-care plan printed.   Nursing Instructions: Give Flu vaccine today    Appended Document: 2 week f/u/cfb Pt left without getting flu vaccine. Dtr called and informed that vaccine could be given any time she and pt could get transportation to clinic or at next office visit. Pt's dtr states she will call ahead if she does bring pt in for injection before next scheduled ov.  Dr. Theotis Barrio so informed.  Trixie Dredge, 12/14/09, 5 P

## 2010-12-18 NOTE — Progress Notes (Signed)
Summary: refill/gg  Phone Note Refill Request  on September 20, 2010 9:00 AM  Refills Requested: Medication #1:  NITROSTAT 0.4 MG SUBL 1 tab under the toungue for chest pain.   Dosage confirmed as above?Dosage Confirmed   Brand Name Necessary? No   Supply Requested: 6 months Pt has used the bottle filled on 10/20.  Her daughter has now taken all her meds and put in safe place.   Method Requested: Electronic Initial call taken by: Merrie Roof RN,  September 20, 2010 9:00 AM  Follow-up for Phone Call        Refill approved-nurse to complete Follow-up by: Lars Mage MD,  September 20, 2010 11:58 AM    Prescriptions: NITROSTAT 0.4 MG SUBL (NITROGLYCERIN) 1 tab under the toungue for chest pain.  #30 x 2   Entered and Authorized by:   Lars Mage MD   Signed by:   Lars Mage MD on 09/20/2010   Method used:   Electronically to        [facility] [phone]* (retail)       [address]       [place], [place]  [address]       Ph: [phone]       Fax: [phone]   RxID:   [id number]

## 2010-12-18 NOTE — Procedures (Signed)
Summary: Endoscopy   EGD  Procedure date:  07/21/2009  Findings:      Location: [facility]   ENDOSCOPY PROCEDURE REPORT  PATIENT:  Katherine [name], Katherine [name]  MR#:  [id number] BIRTHDATE:   [birth year], 77 yrs. old   GENDER:   female  ENDOSCOPIST:   Vania Rea. Jarold Motto, MD, [facility] Referred by:   PROCEDURE DATE:  07/21/2009 PROCEDURE:  EGD with biopsy ASA CLASS:   Class II INDICATIONS: FOBT + stool, iron deficiency anemia ALSO B12 DEFICIENT.  MEDICATIONS:    Versed 1 mg IV, There was residual sedation effect present from prior procedure. TOPICAL ANESTHETIC:    DESCRIPTION OF PROCEDURE:   After the risks benefits and alternatives of the procedure were thoroughly explained, informed consent was obtained.  [facility] [id number] [id number] endoscope was introduced through the mouth and advanced to the second portion of the duodenum, without limitations.  The instrument was slowly withdrawn as the mucosa was fully examined. <<PROCEDUREIMAGES>>      <<OLD IMAGES>>  Moderate gastritis was found. GRANULR AND NODULAR RED ANTRAL MUCOSA BIOPSIED AND CLO TEST DONE.    Retroflexed views revealed no abnormalities.    The scope was then withdrawn from the patient and the procedure completed.  COMPLICATIONS:   None  ENDOSCOPIC IMPRESSION:  1) Moderate gastritis  R/O CHRONIC H.PYLORI INFECTION VS NSAID DAMAGE. RECOMMENDATIONS:  1) anti-reflux regimen  ACIPHEX 20MG /DAY FOR 1 MONTH.SAMPLES GIVEN TO PATIENT'S FAMILY.  REPEAT EXAM:   No   _______________________________ Vania Rea. Jarold Motto, MD, [facility]    CC: Valetta Close, MD      REPORT OF SURGICAL PATHOLOGY   Case #: [id number] Patient Name: Katherine, [name]. Office Chart Number:  N/A [id number] MRN: [id number] Pathologist: Beulah Gandy. Luisa Hart, MD DOB/Age  11-19-1922 (Age: 77)    Gender: F Date Taken:  07/21/2009 Date Received: 07/25/2009   FINAL DIAGNOSIS   ***MICROSCOPIC EXAMINATION AND DIAGNOSIS***   STOMACH:  CHRONIC GASTRITIS.   NO HELICOBACTER PYLORI, DYSPLASIA OR EVIDENCE OF MALIGNANCY IDENTIFIED.   COMMENT A Warthin-Starry stain is performed to determine the possibility of the presence of Helicobacter pylori. The Warthin-Starry stain is negative for organisms of Helicobacter pylori. The control(s) stained appropriately. (JDP:mw, 07/26/09)    mw Date Reported:  07/27/2009     Beulah Gandy. Luisa Hart, MD *** Electronically Signed Out By JDP ***     July 27, 2009 MRN: [id number]    [facility] [address] [place], [place]  [address]    Dear Katherine [name],  I am pleased to inform you that the biopsies taken during your recent endoscopic examination did not show any evidence of cancer upon pathologic examination.   Additional information/recommendations:  __No further action is needed at this time.  Please follow-up with      your primary care physician for your other healthcare needs.  __ Please call [phone] to schedule a return visit to review      your condition.  _xx_ Continue with the treatment plan as outlined on the day of your      exam.  __ You should have a repeat endoscopic examination for this problem              in _ months/years.   Please call us if you are having persistent problems or have questions about your condition that have not been fully answered at this time.  Sincerely,  Mardella Layman MD [facility]  This letter has been electronically signed by your physician.  This report was created from the original endoscopy report, which was reviewed and signed by the above listed endoscopist.

## 2010-12-18 NOTE — Initial Assessments (Signed)
INTERNAL MEDICINE ADMISSION HISTORY AND PHYSICAL  Attending: Dr. Phillips Odor, [facility] First contact: Dr. Loistine Chance [phone] Second contact: Dr. Arvilla Market [phone] [facility], after-hours: [phone])  PCP: Dr. Eben Burow  CC: Chesp pain  HPI:  Katherine [name] is an 77 year old woman who presented to the ED chest pain x 2 weeks. The chest pain is located on the left side of her chest, occurs intermittently (1-2x/day), and typically lasts a few minutes. The pain does not radiate. The chest pain is associated with shortness of breath, however, she feels that deep breathing actually helps reduce the chest pain. She endorses increasing weakness and dizziness for the past month. She denies nausea/vomiting, diaphoresis, cough, change in bowel or urinary habits. She is unsure of the color of her bowel movements or whether they ever have blood in them because she does not look. She had EGD and colonoscopy in 07/2009, which showed chronic gastritis with negative H. pylori and severe diverticulosis of colon. She also describes mild intermittent left-sided abdominal pain for months or years. In the ED, Hb found to be 5.2 (last Hb 12.0 in 06/2009).   ALLERGIES: ! ZOCOR   PAST MEDICAL HISTORY: Rheumatoid arthritis-Previously on MTX x1 yr, failed appropriate f/u. Headache Hypertension TIA, declines aggrenox Renal insufficiency-Baseline 1.3 Breast cancer, hx of DCIS 12/99,       Completed XRT 2/2//00-3/17/00, Tamoxifen 23m once daily 3/00-06/06      All MMG since then have been negative, last 08/11 Depression, improved with remeron Macrocytic Anemia-Folic Def 2/2 likely MTX, HgB 10-12. Osteoporosis-T Score -3.0 right hip, -2.8 left hip and -2.1 Lumbar spine(4 June 08).  Not on bisphosphonate b/c of renal insufficiency Chronic gastritis (negative for h. pylori) Diverticulosis   MEDICATIONS: CALCIUM 600/VITAMIN D   TABS (CALCIUM CARBONATE-VITAMIN D TABS) Take 1 tablet by mouth two times a day for calcium AMLODIPINE  BESYLATE 5 MG TABS (AMLODIPINE BESYLATE) Take 1 tab by mouth once daily ARICEPT 5 MG TABS (DONEPEZIL HCL) Take 1 tab by mouth at bedtime LISINOPRIL 5 MG TABS (LISINOPRIL) Take 1 tablet by mouth once a day TRAMADOL HCL 50 MG TABS (TRAMADOL HCL) Take 1 tab by mouth up to three times a day as needed for pain. IBUPROFEN 400 MG TABS (IBUPROFEN) three times a day for 5 days (started 09/06/2010) NITROSTAT 0.4 MG SUBL (NITROGLYCERIN) 1 tab under the toungue for chest pain. ASPIRIN 81 MG CHEW (ASPIRIN) Take 1 tablet by mouth once a day   SOCIAL HISTORY: Recently had to move in with her daughter, she states because of problems with her neighbors and because her son moved out 2 daughters, one deceased from DM at age 32 Widowed Alcohol use-no   FAMILY HISTORY Family History of CAD Female 1st degree relative <60-Mom died at 7 Sister died with cancer Brother died with DM   ROS: per HPI: other systems reviewed and negative  VITALS:    T:98.4  P:89  BP:151/72  R:20  O2SAT:  100% ON:RA  PHYSICAL EXAM: GEN: NAD HEENT: pale mucus membranes, PERRL, EOMI, no icterus NECK: supple, no JVD, no cervical LN LUNGS: clear to auscultation bilaterally, no wheezing/crackles.  CVS: RRR, no murmur/rubs/gallops, no chest wall tenderness ABD: Soft, mild LLQ tenderness, no rebound or guarding, normal bowel sounds. EXTREMITIES: No edema or cyanosis NEURO: Alert &Oriented x3, no focal motor or sensory deficits.  RECTAL: Stool brown, mildly heme +  LABS: CBC+Diff  WBC  6.1               4.0-10.5         K/uL  RBC                                      2.61       l      3.87-5.11        MIL/uL  Hemoglobin (HGB)                         5.2        L      12.0-15.0        g/dL    CRITICAL RESULT CALLED TO, READ BACK BY AND VERIFIED WITH:    HOSELY K,RN 2124 10.23.11 BUCKSON G  Hematocrit (HCT)                         18.8       l      36.0-46.0        %  MCV                                       72.0       l      78.0-100.0       fL  MCH -                                    19.9       l      26.0-34.0        pg  MCHC                                     27.7       l      30.0-36.0        g/dL  RDW                                      17.6       h      11.5-15.5        %  Platelet Count (PLT)                     219               150-400          K/uL  Neutrophils, %                           68                43-77            %  Lymphocytes, %                           20  12-46            %  Monocytes, %                             9                 3-12             %  Eosinophils, %                           2                 0-5              %  Basophils, %                             1                 0-1              %  Neutrophils, Absolute                    4.2               1.7-7.7          K/uL  Lymphocytes, Absolute                    1.2               0.7-4.0          K/uL  Monocytes, Absolute                      0.5               0.1-1.0          K/uL  Eosinophils, Absolute                    0.1               0.0-0.7          K/uL  Basophils, Absolute                      0.1               0.0-0.1          K/uL  RBC Morphology                           SEE NOTE.    POLYCHROMASIA PRESENT    TARGET CELLS    ELLIPTOCYTES  BMET  Sodium (NA)                              140               135-145          mEq/L  Potassium (K)                            4.3               3.5-5.1  mEq/L  Chloride                                 113        h      96-112           mEq/L  CO2                                      23                19-32            mEq/L  Glucose                                  110        h      70-99            mg/dL  BUN                                      25         h      6-23             mg/dL  Creatinine                               1.19              0.4-1.2          mg/dL  GFR, Est Non African American            43          l      >60              mL/min  GFR, Est African American                52         l      >60              mL/min    Oversized comment, see footnote  1  Calcium                                  9.1               8.4-10.5         mg/dL   CKMB, POC                                1.2               1.0-8.0          ng/mL  Troponin I, POC                          0.07              0.00-0.09        ng/mL  Myoglobin, POC  70.7              12-200           ng/mL  Troponin I                               0.30       h      0.00-0.06        ng/mL    Oversized comment, see footnote  1 Creatine Kinase, Total                   72                7-177            U/L  CK, MB                                   2.4               0.3-4.0          ng/mL  Relative Index                           SEE NOTE.         0.0-2.5    RELATIVE INDEX IS INVALID    WHEN CK < 100 U/L   Occult Blood, Fecal                      POSITIVE  Color, Urine                             YELLOW            YELLOW  Appearance                               CLEAR             CLEAR  Specific Gravity                         1.017             1.005-1.030  pH                                       5.0               5.0-8.0  Urine Glucose                            NEGATIVE          NEG              mg/dL  Bilirubin                                NEGATIVE          NEG  Ketones  NEGATIVE          NEG              mg/dL  Blood                                    NEGATIVE          NEG  Protein                                  NEGATIVE          NEG              mg/dL  Urobilinogen                             0.2               0.0-1.0          mg/dL  Nitrite                                  NEGATIVE          NEG  Leukocytes                               TRACE      a      NEG Squamous Epithelial / LPF                RARE              RARE  WBC / HPF                                0-2                <3               WBC/hpf  Bacteria / HPF                           RARE              RARE  Protime ( Prothrombin Time)              13.4              11.6-15.2        seconds  INR                                      1.00              0.00-1.49  PTT(a-Partial Thromboplastn Time)        28                24-37            seconds    Clinical Data: Chest pain and shortness of breath.    PORTABLE CHEST - 1 VIEW    Comparison: Chest 09/06/2010 and 11/22/2007.    Findings: There is unchanged blunting due to scar at  the left   costophrenic angle with postoperative change in left hemithorax   noted.  Lungs otherwise clear.  Heart size upper normal.    IMPRESSION:   No acute finding.  Stable compared to prior exam.    Read By:  Charyl Dancer,  M.D.   Released By:  Charyl Dancer,  M.D.  ASSESSMENT AND PLAN: (1) Chest pain. Mostly likely secondary to demand ischemia secondary to anemia. ECG demonstrated some ST depression. Will repeat ECG on the floor and in the morning. Will cycle cardiac enzymes x 3. First troponin is elevated to 0.30 but CK, CKMB is normal. Will hold ASA and anticoagulation because of GI bleed. Treat pain with nitroglycerin and morphine.   (2) Anemia. Rectal exam demonstrated no gross blood but she is FOBT positive. Anemia is most likely due to chronic GI blood loss. We consulted GI, who will evaluate her in the morning. We are transfusing 2 units pRBCs and will recheck CBC post-transfusion. She may need additional units pRBCs to reach goal Hb 9.0. Will check CBC twice daily. Will also check anemia panel.   (3) HTN. Continue home medications. Hold antihypertensives for SBP <100.   (4) CKD. Creatinine at baseline. Will closely monitor BMET.   (5) VTE PROPH: SCDs

## 2010-12-18 NOTE — Progress Notes (Signed)
Summary: PREVENTIVE COLONOSCOPY  Phone Note Outgoing Call   Summary of Call: Patient's last colonoscopy was performed on 07/21/2009 by Dr. Sheryn Bison of [facility]. Information has been entered in the EMR.  Initial call taken by: Shon Hough,  September 28, 2010 4:26 PM

## 2010-12-18 NOTE — Progress Notes (Signed)
Summary: social work/ hla  Phone Note Other Incoming   Summary of Call: amy, HHN, called would like social work referral, may give approval? Initial call taken by: Marin Roberts RN,  September 14, 2010 5:06 PM  Follow-up for Phone Call        I will put that referral in.  thanks Follow-up by: Lars Mage MD,  September 17, 2010 2:15 PM  Additional Follow-up for Phone Call Additional follow up Details #1::        I called up the nurse from [facility] and she notified me that the daughter is refusing to set up the social worker visit. Ms Muldrow is supposed to visit clinic tomorrow morning with Dr Cathey Endow and this matter could be discussed at the time of visit. Ms Grosvenor did not pick up the phone.  thanks Additional Follow-up by: Lars Mage MD,  September 18, 2010 12:16 PM      Complete Medication List: 1)  Calcium 600/vitamin D Tabs (Calcium carbonate-vitamin d tabs) .... Take 1 tablet by mouth two times a day for calcium 2)  Amlodipine Besylate 5 Mg Tabs (Amlodipine besylate) .... Take 1 tab by mouth once daily 3)  Aricept 5 Mg Tabs (Donepezil hcl) .... Take 1 tab by mouth at bedtime 4)  Lisinopril 5 Mg Tabs (Lisinopril) .... Take 1 tablet by mouth once a day 5)  Tramadol Hcl 50 Mg Tabs (Tramadol hcl) .... Take 1 tab by mouth up to three times a day as needed for pain. 6)  Nitrostat 0.4 Mg Subl (Nitroglycerin) .Marland Kitchen.. 1 tab under the toungue for chest pain. 7)  Aspirin 81 Mg Chew (Aspirin) .... Take 1 tablet by mouth once a day 8)  Ferrous Sulfate 325 (65 Fe) Mg Tabs (Ferrous sulfate) .... Take 1 tablet by mouth two times a day  Other Orders: Social Work Referral (Social )   Appended Document: social work/ hla this would have been HH sw. will send an alert to donna t.

## 2010-12-18 NOTE — Progress Notes (Signed)
Summary: med refill/gp  Phone Note Refill Request Message from:  Fax from Pharmacy on April 23, 2010 10:04 AM  Refills Requested: Medication #1:  ARICEPT 5 MG TABS Take 1 tab by mouth at bedtime   Dosage confirmed as above?Dosage Confirmed   Brand Name Necessary? No   Supply Requested: 1 year   Last Refilled: 03/25/2010  Medication #2:  AMLODIPINE BESYLATE 5 MG TABS Take 1 tab by mouth once daily   Dosage confirmed as above?Dosage Confirmed   Brand Name Necessary? No   Supply Requested: 1 year Last appt. 03/2010.   Method Requested: Electronic Initial call taken by: Chinita Pester RN,  April 23, 2010 10:04 AM  Follow-up for Phone Call        Refill approved-nurse to complete Follow-up by: Lars Mage MD,  April 23, 2010 7:38 PM    Prescriptions: AMLODIPINE BESYLATE 5 MG TABS (AMLODIPINE BESYLATE) Take 1 tab by mouth once daily  #30 x 11   Entered and Authorized by:   Lars Mage MD   Signed by:   Lars Mage MD on 04/23/2010   Method used:   Electronically to        [facility] [phone][facility] (retail)       [address] [place]  [address]       Ph: [phone] or [phone]       Fax: [phone]   RxID:   [id number] ARICEPT 5 MG TABS (DONEPEZIL HCL) Take 1 tab by mouth at bedtime  #31 Tablet x 11   Entered and Authorized by:   Lars Mage MD   Signed by:   Lars Mage MD on 04/23/2010   Method used:   Electronically to        [facility] [phone][facility] (retail)       [address]       [place], [place]  [address]       Ph: [phone] or [phone]       Fax: [phone]   RxID:   [id number]

## 2010-12-18 NOTE — Miscellaneous (Signed)
Summary: [facility] CERTIF[facility]  [facility] CERTIFICATION   Imported By: Shon Hough 10/16/2010 11:53:13  _____________________________________________________________________  External Attachment:    Type:   Image     Comment:   External Document

## 2010-12-18 NOTE — Progress Notes (Signed)
Summary: phone/gg  Phone Note Call from Patient   Caller: Daughter Summary of Call: Pt daughter called stating pt  c/o chest pain yesterday.  No Hx of chest pain.  Denies SOB, nausea or diaphoresis.  Pt is fine today but wanted to be seen as her October appointment had been cancelled.  I told her pt needs to be seen today for evaluation but faimly can not get pt here until tomorrow.  Daughter states "there is not way I can get her there unless I tote her" Appointment given for tomorrow AM.  ED if return of chest pain. Phone # [phone]   Initial call taken by: Merrie Roof RN,  September 05, 2010 9:33 AM  Follow-up for Phone Call        I agree with you plan. THanks Follow-up by: Blanch Media MD,  September 05, 2010 9:45 AM

## 2010-12-18 NOTE — Progress Notes (Signed)
Summary: Home Health  Phone Note Outgoing Call   Summary of Call: Called [facility] to find out what was going on with this patient.  Amy Angrave [phone]) was the nurse who did med mgmt/teaching.  The patient also recd PT.  Amy said that the patient was doing well and walking without the aid of a cane and so [facility] d/c services because the patient made good progress.   [facility] had their social work go out to meet the family to discuss PCS/PACE but once the Child psychotherapist got out there, the dgtr was not interested in social work or those [employer].  I asked Amy if there were any other issues that we should be concerned about and she said there were none at this point.

## 2010-12-18 NOTE — Assessment & Plan Note (Signed)
Summary: F/U/SITKO/VS   Vital Signs:  Patient profile:   77 year old female Height:      65.5 inches (166.37 cm) Weight:      119.9 pounds (54.50 kg) Temp:     98.7 degrees F (37.06 degrees C) oral Pulse rate:   83 / minute BP sitting:   144 / 82  (left arm)  Vitals Entered By: Stanton Kidney Ditzler RN (February 28, 2010 2:22 PM) Is Patient Diabetic? No Pain Assessment Patient in pain? yes     Location: Right knee and chest Intensity: ? Type: ? Onset of pain  long time Nutritional Status BMI of 19 -24 = normal Nutritional Status Detail appetite good  Have you ever been in a relationship where you felt threatened, hurt or afraid?denies   Does patient need assistance? Functional Status Self care Ambulation Impaired:Risk for fall Comments FU - no change. Lives with daughter. Uses a cane.   Primary Care Provider:  Brooks Sailors MD   History of Present Illness: 77 yo female with PMH outlined below presents to [facility] for regular follow up appointment. She has no concerns at the time. No recent sicknesses or hospitalizaitons. No episodes of chest pain, SOB, palpitations. No specific abdominal or urinary concerns. No recent changes in appetite, weight, sleep patterns, mood. has chronic headaches and chronic back pain, mostly deals with it but would like to know if she can have something for it.    Depression History:      The patient denies a depressed mood most of the day and a diminished interest in her usual daily activities.  The patient denies significant weight loss, significant weight gain, insomnia, hypersomnia, psychomotor agitation, psychomotor retardation, fatigue (loss of energy), feelings of worthlessness (guilt), impaired concentration (indecisiveness), and recurrent thoughts of death or suicide.        The patient denies that she feels like life is not worth living, denies that she wishes that she were dead, and denies that she has thought about ending her life.          Preventive Screening-Counseling & Management  Alcohol-Tobacco     Alcohol drinks/day: 0     Smoking Status: never     Cans of tobacco/week: yes  Caffeine-Diet-Exercise     Does Patient Exercise: no  Problems Prior to Update: 1)  Orthostatic Dizziness  (ICD-780.4) 2)  Skin Lesion  (ICD-709.9) 3)  Shoulder Pain, Right  (ICD-719.41) 4)  Gastritis  (ICD-535.50) 5)  Guaiac Positive Stool  (ICD-578.1) 6)  Av Block, 1st Degree  (ICD-426.11) 7)  Tachycardia  (ICD-785.0) 8)  Hyperlipidemia  (ICD-272.4) 9)  Hallucinations  (ICD-780.1) 10)  Vitamin B12 Deficiency  (ICD-266.2) 11)  Unspecified Anemia  (ICD-285.9) 12)  Health Screening  (ICD-V70.0) 13)  Shoulder Pain, Left  (ICD-719.41) 14)  Tia  (ICD-435.9) 15)  Knee Pain, Right, Chronic  (ICD-719.46) 16)  Osteoporosis  (ICD-733.00) 17)  Symptom, Memory Loss  (ICD-780.93) 18)  Degenerative Joint Disease, Knee  (ICD-715.96) 19)  Breast Mass, Left  (ICD-611.72) 20)  Family History of Cad Female 1st Degree Relative <60  (ICD-V16.49) 21)  Anemia, Folate-deficiency  (ICD-281.2) 22)  Disorder, Dysthymic  (ICD-300.4) 23)  Cholecystectomy, Hx of  (ICD-V45.79) 24)  Appendectomy, Hx of  (ICD-V45.79) 25)  Cataract Extraction, Right Eye, Hx of  (ICD-V45.61) 26)  Breast Cancer, Hx of  (ICD-V10.3) 27)  Renal Insufficiency  (ICD-588.9) 28)  Hypertension  (ICD-401.9) 29)  Headache  (ICD-784.0) 30)  Rheumatoid Arthritis  (ICD-714.0)  Medications  Prior to Update: 1)  Calcium 600/vitamin D   Tabs (Calcium Carbonate-Vitamin D Tabs) .... Take 1 Tablet By Mouth Two Times A Day For Calcium 2)  Amlodipine Besylate 5 Mg Tabs (Amlodipine Besylate) .... Take 1 Tab By Mouth Once Daily 3)  Cyanocobalamin 1000 Mcg/ml Soln (Cyanocobalamin) .... Please Give Injection Once Weekly For One Month, and Then Once Monthly 4)  Tylenol Extra Strength 500 Mg Tabs (Acetaminophen) .... Take 1 Tablet By Mouth Three Times A Day As Needed For Pain 5)  Aricept 5 Mg Tabs  (Donepezil Hcl) .... Take 1 Tab By Mouth At Bedtime 6)  Lisinopril 5 Mg Tabs (Lisinopril) .... Take 1 Tablet By Mouth Once A Day 7)  Tramadol Hcl 50 Mg Tabs (Tramadol Hcl) .... Take 1 Tab By Mouth Up To Three Times A Day As Needed For Pain. 8)  Syringe 25g X 5/8" 3 Ml Misc (Syringe/needle (Disp)) .... Use Syringes As Directed By The Clinic Nurse To Inject Vit B12 Every Month.  Current Medications (verified): 1)  Calcium 600/vitamin D   Tabs (Calcium Carbonate-Vitamin D Tabs) .... Take 1 Tablet By Mouth Two Times A Day For Calcium 2)  Amlodipine Besylate 5 Mg Tabs (Amlodipine Besylate) .... Take 1 Tab By Mouth Once Daily 3)  Cyanocobalamin 1000 Mcg/ml Soln (Cyanocobalamin) .... Please Give Injection Once Weekly For One Month, and Then Once Monthly 4)  Aricept 5 Mg Tabs (Donepezil Hcl) .... Take 1 Tab By Mouth At Bedtime 5)  Lisinopril 5 Mg Tabs (Lisinopril) .... Take 1 Tablet By Mouth Once A Day 6)  Tramadol Hcl 50 Mg Tabs (Tramadol Hcl) .... Take 1 Tab By Mouth Up To Three Times A Day As Needed For Pain. 7)  Syringe 25g X 5/8" 3 Ml Misc (Syringe/needle (Disp)) .... Use Syringes As Directed By The Clinic Nurse To Inject Vit B12 Every Month.  Allergies: 1)  ! Zocor  Past History:  Past Medical History: Last updated: 09/02/2008 Rheumatoid arthritis-Previously on MTX x1 yr, failed appropriate f/u. Headache Hypertension TIA, declines aggrenox Renal insufficiency-Baseline 1.3 Breast cancer, hx of DCIS 12/99,       Completed XRT 2/2//00-3/17/00, Tamoxifen 78m once daily 3/00-06/06      All MMG since then have been negative, last 04/09 Depression, improved with remeron Macrocytic Anemia-Folic Def 2/2 likely MTX Osteoporosis-T Score -3.0 right hip, -2.8 left hip and -2.1 Lumbar spine(4 June 08).  Not on bisphosphonate b/c of renal insufficiency  Past Surgical History: Last updated: 03/25/2007 Appendectomy Cholecystectomy Lung-wedge resection LUL with Video thorascopy benign granumloma  04/25/00  Family History: Last updated: 03/25/2007 Family History of CAD Female 1st degree relative <60-Mom died at 24 Sister died with cancer Brother died with DM  Social History: Last updated: 07/04/2009 Recently had to move in with her daughter, she states because of problems with her neighbors and because her son moved out 2 daughters, one deceased from DM at age 38 Widowed Alcohol use-no  Risk Factors: Alcohol Use: 0 (02/28/2010) Exercise: no (02/28/2010)  Risk Factors: Smoking Status: never (02/28/2010) Cans of tobacco/wk: yes (02/28/2010)  Family History: Reviewed history from 03/25/2007 and no changes required. Family History of CAD Female 1st degree relative <60-Mom died at 77 Sister died with cancer Brother died with DM  Social History: Reviewed history from 07/04/2009 and no changes required. Recently had to move in with her daughter, she states because of problems with her neighbors and because her son moved out 2 daughters, one deceased from DM at age 67 Widowed Alcohol  use-no  Review of Systems       per HPI  Physical Exam  General:  Well-developed,well-nourished,in no acute distress; alert,appropriate and cooperative throughout examination Lungs:  Normal respiratory effort, chest expands symmetrically. Lungs are clear to auscultation, no crackles or wheezes. Heart:  Normal rate and regular rhythm. S1 and S2 normal without gallop, murmur, click, rub or other extra sounds. Abdomen:  Bowel sounds positive,abdomen soft and non-tender without masses, organomegaly or hernias noted. Msk:  No deformity or scoliosis noted of thoracic or lumbar spine.   Neurologic:  No cranial nerve deficits noted. Station and gait are normal. Plantar reflexes are down-going bilaterally. DTRs are symmetrical throughout. Sensory, motor and coordinative functions appear intact. Psych:  Cognition and judgment appear intact. Alert and cooperative with normal attention span and  concentration. No apparent delusions, illusions, hallucinations   Impression & Recommendations:  Problem # 1:  HYPERLIPIDEMIA (ICD-272.4) Will check FLP today and will adjust the regimen as indicated.  Labs Reviewed: SGOT: 22 (07/04/2009)   SGPT: 10 (07/04/2009)   HDL:70 (02/07/2009), 80 (05/06/2008)  LDL:97 (02/07/2009), 103 (04/54/0981)  Chol:184 (02/07/2009), 196 (05/06/2008)  Trig:85 (02/07/2009), 64 (05/06/2008)  Orders: T-Lipid Profile ([id number])  Problem # 2:  DEGENERATIVE JOINT DISEASE, KNEE (ICD-715.96) Chronic pain and has been trying tylenol and tramadol but no significant relief. will try low dose percocet and will follow up on her symptoms.  The following medications were removed from the medication list:    Tylenol Extra Strength 500 Mg Tabs (Acetaminophen) .Marland Kitchen... Take 1 tablet by mouth three times a day as needed for pain Her updated medication list for this problem includes:    Tramadol Hcl 50 Mg Tabs (Tramadol hcl) .Marland Kitchen... Take 1 tab by mouth up to three times a day as needed for pain.    Percocet 5-325 Mg Tabs (Oxycodone-acetaminophen) .Marland Kitchen... Take 1 tablet twice daily as needed for pain  Problem # 3:  ANEMIA, FOLATE-DEFICIENCY (ICD-281.2) Continue the same regimen.  Her updated medication list for this problem includes:    Cyanocobalamin 1000 Mcg/ml Soln (Cyanocobalamin) .Marland Kitchen... Please give injection once weekly for one month, and then once monthly  Hgb: 12.0 (07/04/2009)   Hct: 35.7 (07/04/2009)   Platelets: 182.0 (07/04/2009) RBC: 3.89 (07/04/2009)   RDW: 14.6 (07/04/2009)   WBC: 6.3 (07/04/2009) MCV: 91.9 (07/04/2009)   MCHC: 33.7 (07/04/2009) Retic Ct: 27.4 K/uL (02/07/2009)   Ferritin: 31.3 (07/04/2009) Iron: 58 (07/04/2009)   TIBC: 300 (03/25/2007)   % Sat: 23.2 (07/04/2009) B12: 250 (12/14/2009)   Folate: 14.3 (07/04/2009)   TSH: 2.09 (07/04/2009)  Problem # 4:  HYPERTENSION (ICD-401.9) Slightly above heh goal, will continue to monitor but willnot make any  changes today.  Her updated medication list for this problem includes:    Amlodipine Besylate 5 Mg Tabs (Amlodipine besylate) .Marland Kitchen... Take 1 tab by mouth once daily    Lisinopril 5 Mg Tabs (Lisinopril) .Marland Kitchen... Take 1 tablet by mouth once a day  BP today: 144/82 Prior BP: 122/73 (02/09/2010)  Labs Reviewed: K+: 4.1 (12/14/2009) Creat: : 1.26 (12/14/2009)   Chol: 184 (02/07/2009)   HDL: 70 (02/07/2009)   LDL: 97 (02/07/2009)   TG: 85 (02/07/2009)  Complete Medication List: 1)  Calcium 600/vitamin D Tabs (Calcium carbonate-vitamin d tabs) .... Take 1 tablet by mouth two times a day for calcium 2)  Amlodipine Besylate 5 Mg Tabs (Amlodipine besylate) .... Take 1 tab by mouth once daily 3)  Cyanocobalamin 1000 Mcg/ml Soln (Cyanocobalamin) .... Please give injection once weekly for one  month, and then once monthly 4)  Aricept 5 Mg Tabs (Donepezil hcl) .... Take 1 tab by mouth at bedtime 5)  Lisinopril 5 Mg Tabs (Lisinopril) .... Take 1 tablet by mouth once a day 6)  Tramadol Hcl 50 Mg Tabs (Tramadol hcl) .... Take 1 tab by mouth up to three times a day as needed for pain. 7)  Syringe 25g X 5/8" 3 Ml Misc (Syringe/needle (disp)) .... Use syringes as directed by the clinic nurse to inject vit b12 every month. 8)  Percocet 5-325 Mg Tabs (Oxycodone-acetaminophen) .... Take 1 tablet twice daily as needed for pain  Other Orders: T-Basic Metabolic Panel ([id number])  Patient Instructions: 1)  Please schedule a follow-up appointment in 3 months. 2)  Please check your blood pressure regularly, if it is >170 please call clinic at [phone] Prescriptions: PERCOCET 5-325 MG TABS (OXYCODONE-ACETAMINOPHEN) take 1 tablet twice daily as needed for pain  #60 x 0   Entered and Authorized by:   Mliss Sax MD   Signed by:   Mliss Sax MD on 02/28/2010   Method used:   Print then Give to Patient   RxID:   [id number]  Process Orders Check Orders Results:     [facility]: Check  successful Order queued for requisitioning for [facility]: February 28, 2010 2:54 PM  Tests Sent for requisitioning (February 28, 2010 2:54 PM):     02/28/2010: [facility] -- T-Lipid Profile [phone] (signed)     02/28/2010: [facility] -- T-Basic Metabolic Panel [id number] (signed)    Prevention & Chronic Care Immunizations   Influenza vaccine: Fluvax 3+  (01/26/2010)   Influenza vaccine due: 07/19/2009    Tetanus booster: Not documented   Td booster deferral: Not indicated  (02/28/2010)    Pneumococcal vaccine: given  (08/19/2005)    H. zoster vaccine: Not documented   H. zoster vaccine deferral: Not indicated  (02/28/2010)  Colorectal Screening   Hemoccult: Not documented   Hemoccult action/deferral: Not indicated  (02/28/2010)    Colonoscopy: Location:  [facility].    (07/21/2009)   Colonoscopy action/deferral: Deferred  (02/28/2010)  Other Screening   Pap smear: Refused  (08/19/2005)   Pap smear action/deferral: Refused  (02/28/2010)    Mammogram: ASSESSMENT: Negative - BI-RADS 1^MM DIGITAL SCREENING  (07/11/2009)   Mammogram action/deferral: Ordered  (06/22/2009)    DXA bone density scan: T-3.0 Left hip T-2.8 right hip T-2.1 L Spine  (04/22/2007)   DXA bone density action/deferral: Deferred  (02/28/2010)   DXA scan due: 04/2009    Smoking status: never  (02/28/2010)  Lipids   Total Cholesterol: 184  (02/07/2009)   LDL: 97  (02/07/2009)   LDL Direct: Not documented   HDL: 70  (02/07/2009)   Triglycerides: 85  (02/07/2009)    SGOT (AST): 22  (07/04/2009)   SGPT (ALT): 10  (07/04/2009)   Alkaline phosphatase: 45  (07/04/2009)   Total bilirubin: 1.2  (07/04/2009)    Lipid flowsheet reviewed?: Yes   Progress toward LDL goal: Unchanged  Hypertension   Last Blood Pressure: 144 / 82  (02/28/2010)   Serum creatinine: 1.26  (12/14/2009)   Serum potassium 4.1  (12/14/2009)    Hypertension flowsheet reviewed?:  Yes   Progress toward BP goal: Unchanged  Self-Management Support :   Personal Goals (by the next clinic visit) :      Personal blood pressure goal: 140/90  (12/14/2009)     Personal LDL goal: 100  (12/14/2009)    Patient  will work on the following items until the next clinic visit to reach self-care goals:     Medications and monitoring: take my medicines every day, bring all of my medications to every visit  (02/28/2010)     Eating: drink diet soda or water instead of juice or soda, eat more vegetables, use fresh or frozen vegetables, eat foods that are low in salt, eat fruit for snacks and desserts, limit or avoid alcohol  (02/28/2010)     Other: not walking or exercising - states right foot pain and knee pain had stopped her in past but were better now  (12/14/2009)    Hypertension self-management support: Written self-care plan, Education handout, Resources for patients handout  (02/28/2010)   Hypertension self-care plan printed.   Hypertension education handout printed    Lipid self-management support: Written self-care plan, Education handout, Resources for patients handout  (02/28/2010)   Lipid self-care plan printed.   Lipid education handout printed      Resource handout printed.

## 2010-12-18 NOTE — Assessment & Plan Note (Signed)
Summary: 2 week f/u (magick/cfb   Vital Signs:  Patient profile:   77 year old female Height:      65.5 inches (166.37 cm) Weight:      120.8 pounds (54.91 kg) Temp:     98.4 degrees F (36.89 degrees C) oral Pulse rate:   77 / minute BP sitting:   122 / 73  (left arm) Cuff size:   regular  Vitals Entered By: Chinita Pester RN (February 09, 2010 11:29 AM) CC: F/u visit. Stated she felled this morning d/t right knee. Is Patient Diabetic? No Pain Assessment Patient in pain? yes     Location: rt knee pain Intensity: 10 Type: sharp Onset of pain  Intermittent Nutritional Status BMI of 19 -24 = normal  Have you ever been in a relationship where you felt threatened, hurt or afraid?No   Does patient need assistance? Functional Status Self care, Cook/clean, Shopping, Social activities Ambulation Impaired:Risk for fall   Primary Care Provider:  Brooks Sailors MD  CC:  F/u visit. Stated she felled this morning d/t right knee.Marland Kitchen  History of Present Illness: 77 yo man with PMH as described in EMR is here today for a follow up appointment of her Vit B12 deficiency (for her shot) and has also brought her grand son with her to learn how to give inj at home.  She says that she was about to fall this morning when she was getting ready to come here but held on to her grandson. She is still not using her cane or walker which was discussed the last time.  No other complaints today.   Depression History:      The patient denies a depressed mood most of the day and a diminished interest in her usual daily activities.         Preventive Screening-Counseling & Management  Alcohol-Tobacco     Alcohol drinks/day: 0     Smoking Status: never     Cans of tobacco/week: yes  Caffeine-Diet-Exercise     Does Patient Exercise: no  Problems Prior to Update: 1)  Orthostatic Dizziness  (ICD-780.4) 2)  Skin Lesion  (ICD-709.9) 3)  Shoulder Pain, Right  (ICD-719.41) 4)  Gastritis  (ICD-535.50) 5)   Guaiac Positive Stool  (ICD-578.1) 6)  Av Block, 1st Degree  (ICD-426.11) 7)  Tachycardia  (ICD-785.0) 8)  Hyperlipidemia  (ICD-272.4) 9)  Hallucinations  (ICD-780.1) 10)  Vitamin B12 Deficiency  (ICD-266.2) 11)  Unspecified Anemia  (ICD-285.9) 12)  Health Screening  (ICD-V70.0) 13)  Shoulder Pain, Left  (ICD-719.41) 14)  Tia  (ICD-435.9) 15)  Knee Pain, Right, Chronic  (ICD-719.46) 16)  Osteoporosis  (ICD-733.00) 17)  Symptom, Memory Loss  (ICD-780.93) 18)  Degenerative Joint Disease, Knee  (ICD-715.96) 19)  Breast Mass, Left  (ICD-611.72) 20)  Family History of Cad Female 1st Degree Relative <60  (ICD-V16.49) 21)  Anemia, Folate-deficiency  (ICD-281.2) 22)  Disorder, Dysthymic  (ICD-300.4) 23)  Cholecystectomy, Hx of  (ICD-V45.79) 24)  Appendectomy, Hx of  (ICD-V45.79) 25)  Cataract Extraction, Right Eye, Hx of  (ICD-V45.61) 26)  Breast Cancer, Hx of  (ICD-V10.3) 27)  Renal Insufficiency  (ICD-588.9) 28)  Hypertension  (ICD-401.9) 29)  Headache  (ICD-784.0) 30)  Rheumatoid Arthritis  (ICD-714.0)  Medications Prior to Update: 1)  Calcium 600/vitamin D   Tabs (Calcium Carbonate-Vitamin D Tabs) .... Take 1 Tablet By Mouth Two Times A Day For Calcium 2)  Amlodipine Besylate 5 Mg Tabs (Amlodipine Besylate) .... Take 1 Tab By Mouth  Once Daily 3)  Cyanocobalamin 1000 Mcg/ml Soln (Cyanocobalamin) .... Please Give Injection Once Weekly For One Month, and Then Once Monthly 4)  Tylenol Extra Strength 500 Mg Tabs (Acetaminophen) .... Take 1 Tablet By Mouth Three Times A Day As Needed For Pain 5)  Aricept 5 Mg Tabs (Donepezil Hcl) .... Take 1 Tab By Mouth At Bedtime 6)  Lisinopril 5 Mg Tabs (Lisinopril) .... Take 1 Tablet By Mouth Once A Day 7)  Tramadol Hcl 50 Mg Tabs (Tramadol Hcl) .... Take 1 Tab By Mouth Up To Three Times A Day As Needed For Pain.  Current Medications (verified): 1)  Calcium 600/vitamin D   Tabs (Calcium Carbonate-Vitamin D Tabs) .... Take 1 Tablet By Mouth Two Times A  Day For Calcium 2)  Amlodipine Besylate 5 Mg Tabs (Amlodipine Besylate) .... Take 1 Tab By Mouth Once Daily 3)  Cyanocobalamin 1000 Mcg/ml Soln (Cyanocobalamin) .... Please Give Injection Once Weekly For One Month, and Then Once Monthly 4)  Tylenol Extra Strength 500 Mg Tabs (Acetaminophen) .... Take 1 Tablet By Mouth Three Times A Day As Needed For Pain 5)  Aricept 5 Mg Tabs (Donepezil Hcl) .... Take 1 Tab By Mouth At Bedtime 6)  Lisinopril 5 Mg Tabs (Lisinopril) .... Take 1 Tablet By Mouth Once A Day 7)  Tramadol Hcl 50 Mg Tabs (Tramadol Hcl) .... Take 1 Tab By Mouth Up To Three Times A Day As Needed For Pain. 8)  Syringe 25g X 5/8" 3 Ml Misc (Syringe/needle (Disp)) .... Use Syringes As Directed By The Clinic Nurse To Inject Vit B12 Every Month.  Allergies (verified): 1)  ! Zocor  Past History:  Past Medical History: Last updated: 09/02/2008 Rheumatoid arthritis-Previously on MTX x1 yr, failed appropriate f/u. Headache Hypertension TIA, declines aggrenox Renal insufficiency-Baseline 1.3 Breast cancer, hx of DCIS 12/99,       Completed XRT 2/2//00-3/17/00, Tamoxifen 71m once daily 3/00-06/06      All MMG since then have been negative, last 04/09 Depression, improved with remeron Macrocytic Anemia-Folic Def 2/2 likely MTX Osteoporosis-T Score -3.0 right hip, -2.8 left hip and -2.1 Lumbar spine(4 June 08).  Not on bisphosphonate b/c of renal insufficiency  Past Surgical History: Last updated: 03/25/2007 Appendectomy Cholecystectomy Lung-wedge resection LUL with Video thorascopy benign granumloma 04/25/00  Family History: Last updated: 03/25/2007 Family History of CAD Female 1st degree relative <60-Mom died at 84 Sister died with cancer Brother died with DM  Social History: Last updated: 07/04/2009 Recently had to move in with her daughter, she states because of problems with her neighbors and because her son moved out 2 daughters, one deceased from DM at age  39 Widowed Alcohol use-no  Risk Factors: Alcohol Use: 0 (02/09/2010) Exercise: no (02/09/2010)  Risk Factors: Smoking Status: never (02/09/2010) Cans of tobacco/wk: yes (02/09/2010)  Review of Systems      See HPI  Physical Exam  Additional Exam:  Gen: AOx3, in no acute distress, sitting in wheel chair. Eyes: PERRL, EOMI ENT:MMM, No erythema noted in posterior pharynx Neck: No JVD, No LAP Chest: CTAB with  good respiratory effort CVS: regular rhythmic rate, NO M/R/G, S1 S2 normal Abdo: soft,ND, BS+x4, Non tender and No hepatosplenomegaly EXT: No odema noted, small skin thickening noted over plater surface of right foot, all movements of upper and lower are normal  Neuro: Non focal, gait is normal Skin: no rashes noted.    Impression & Recommendations:  Problem # 1:  VITAMIN B12 DEFICIENCY (ICD-266.2) Assessment Unchanged Her rand  son who is 43 was with her today to learn how to give Vit B12 shot at home. Glenda taught him the technique and he was very comfortable with giving the shots himself to his grandmother which was reconfirmed by me. Orders: Admin of Therapeutic Inj  intramuscular or subcutaneous (30865) Vit B12 1000 mcg (J3420)  Problem # 2:  ORTHOSTATIC DIZZINESS (ICD-780.4) Assessment: Unchanged Patient is still not using the cane or walker for assistance with walkingand was about to fall this morning. I discussed in detail the importance of using a support and the risks associated with fall at this age. She promised to use a cane from now on.  Problem # 3:  HYPERTENSION (ICD-401.9) Assessment: Comment Only Wellcontrolled with current meds. Her updated medication list for this problem includes:    Amlodipine Besylate 5 Mg Tabs (Amlodipine besylate) .Marland Kitchen... Take 1 tab by mouth once daily    Lisinopril 5 Mg Tabs (Lisinopril) .Marland Kitchen... Take 1 tablet by mouth once a day  BP today: 122/73 Prior BP: 154/88 (01/26/2010)  Labs Reviewed: K+: 4.1 (12/14/2009) Creat: :  1.26 (12/14/2009)   Chol: 184 (02/07/2009)   HDL: 70 (02/07/2009)   LDL: 97 (02/07/2009)   TG: 85 (02/07/2009)  Complete Medication List: 1)  Calcium 600/vitamin D Tabs (Calcium carbonate-vitamin d tabs) .... Take 1 tablet by mouth two times a day for calcium 2)  Amlodipine Besylate 5 Mg Tabs (Amlodipine besylate) .... Take 1 tab by mouth once daily 3)  Cyanocobalamin 1000 Mcg/ml Soln (Cyanocobalamin) .... Please give injection once weekly for one month, and then once monthly 4)  Tylenol Extra Strength 500 Mg Tabs (Acetaminophen) .... Take 1 tablet by mouth three times a day as needed for pain 5)  Aricept 5 Mg Tabs (Donepezil hcl) .... Take 1 tab by mouth at bedtime 6)  Lisinopril 5 Mg Tabs (Lisinopril) .... Take 1 tablet by mouth once a day 7)  Tramadol Hcl 50 Mg Tabs (Tramadol hcl) .... Take 1 tab by mouth up to three times a day as needed for pain. 8)  Syringe 25g X 5/8" 3 Ml Misc (Syringe/needle (disp)) .... Use syringes as directed by the clinic nurse to inject vit b12 every month.  Patient Instructions: 1)  Please use your cane or wlaker at all times to prent falling. 2)  Take an Aspirin every day. 3)  Check your Blood Pressure regularly. If it is above: 140/90 you should make an appointment. 4)  Please schedule a follow-up appointment in 3 months. 5)  Please schedule a follow-up appointment as needed. Prescriptions: CYANOCOBALAMIN 1000 MCG/ML SOLN (CYANOCOBALAMIN) Please give injection once weekly for one month, and then once monthly  #5 x 1   Entered and Authorized by:   Lars Mage MD   Signed by:   Lars Mage MD on 02/09/2010   Method used:   Electronically to        [facility] [phone]* (retail)       [address], [place]  [address]       Ph: [phone] or [phone]       Fax: [phone]   RxID:   [id number] SYRINGE 25G X 5/8" 3 ML MISC (SYRINGE/NEEDLE (DISP)) use syringes as directed by the clinic nurse to inject Vit b12 every month.  #10 x 1    Entered and Authorized by:   Lars Mage MD   Signed by:   Lars Mage MD on 02/09/2010  Method used:   Electronically to        [facility] [phone]* (retail)       [address], [place]  [address]       Ph: [phone] or [phone]       Fax: [phone]   RxID:   [id number]   Prevention & Chronic Care Immunizations   Influenza vaccine: Fluvax 3+  (01/26/2010)   Influenza vaccine due: 07/19/2009    Tetanus booster: Not documented   Td booster deferral: Deferred  (01/26/2010)    Pneumococcal vaccine: given  (08/19/2005)    H. zoster vaccine: Not documented  Colorectal Screening   Hemoccult: Not documented    Colonoscopy: Location:  [facility].    (07/21/2009)  Other Screening   Pap smear: Refused  (08/19/2005)    Mammogram: ASSESSMENT: Negative - BI-RADS 1^MM DIGITAL SCREENING  (07/11/2009)   Mammogram action/deferral: Ordered  (06/22/2009)    DXA bone density scan: T-3.0 Left hip T-2.8 right hip T-2.1 L Spine  (04/22/2007)   DXA scan due: 04/2009    Smoking status: never  (02/09/2010)  Lipids   Total Cholesterol: 184  (02/07/2009)   LDL: 97  (02/07/2009)   LDL Direct: Not documented   HDL: 70  (02/07/2009)   Triglycerides: 85  (02/07/2009)    SGOT (AST): 22  (07/04/2009)   SGPT (ALT): 10  (07/04/2009)   Alkaline phosphatase: 45  (07/04/2009)   Total bilirubin: 1.2  (07/04/2009)    Lipid flowsheet reviewed?: Yes   Progress toward LDL goal: At goal  Hypertension   Last Blood Pressure: 122 / 73  (02/09/2010)   Serum creatinine: 1.26  (12/14/2009)   Serum potassium 4.1  (12/14/2009)    Hypertension flowsheet reviewed?: Yes   Progress toward BP goal: At goal  Self-Management Support :   Personal Goals (by the next clinic visit) :      Personal blood pressure goal: 140/90  (12/14/2009)     Personal LDL goal: 100  (12/14/2009)    Hypertension self-management support: Written self-care plan  (02/09/2010)    Hypertension self-care plan printed.    Lipid self-management support: Written self-care plan  (01/26/2010)      Medication Administration  Injection # 1:    Medication: Vit B12 1000 mcg    Diagnosis: VITAMIN B12 DEFICIENCY (ICD-266.2)    Route: IM    Site: R deltoid    Exp Date: 09/2011    Lot #: [id number]    Mfr: [employer]    Comments: Pt.'s grandson taught how to give Vit. B12 injection.  He feels comfortable giving the injection.    Patient tolerated injection without complications    Given by: Chinita Pester RN (February 09, 2010 12:23 PM)  Orders Added: 1)  Est. Patient Level IV [37106] 2)  Admin of Therapeutic Inj  intramuscular or subcutaneous [96372] 3)  Vit B12 1000 mcg [J3420]

## 2010-12-18 NOTE — Assessment & Plan Note (Signed)
Summary: 2WK F/U/SITKO/VS   Vital Signs:  Patient profile:   77 year old female Height:      65.5 inches (166.37 cm) Weight:      121.8 pounds (55.36 kg) Temp:     97.1 degrees F (36.17 degrees C) oral Pulse rate:   81 / minute BP sitting:   154 / 88  (right arm)  Vitals Entered By: Chinita Pester RN (January 26, 2010 9:51 AM) CC: f/u visit.  Is Patient Diabetic? No Pain Assessment Patient in pain? yes     Location: right leg Intensity: "not too bad" Type: aching Onset of pain  Intermittent; pain increases w/activity Nutritional Status BMI of 19 -24 = normal  Have you ever been in a relationship where you felt threatened, hurt or afraid?No   Does patient need assistance? Functional Status Self care, Cook/clean, Shopping, Social activities Ambulation Impaired:Risk for fall Comments gait unsteady; states she has 2 canes and a walker at home - encourage pt to use them. Lives w/her daughter and grandsons.  List of facilities given to pt. per Dorothe Pea.   Primary Care Provider:  Brooks Sailors MD  CC:  f/u visit. Marland Kitchen  History of Present Illness: 77 year old women with pmh as described in EMR is here today for a follow up of her shoulder pain and dizzyness and also to get her Vit B12 shot. No new complaints today. She was not interested in talking about resources for assisted living facility and says that her daughter and grand sons are taking good care of her. Me and Dr Rogelia Boga tried to tell her about the possible resources but she was not interested during this visit.  Depression History:      The patient denies a depressed mood most of the day and a diminished interest in her usual daily activities.         Preventive Screening-Counseling & Management  Alcohol-Tobacco     Alcohol drinks/day: 0     Smoking Status: never     Cans of tobacco/week: yes  Caffeine-Diet-Exercise     Does Patient Exercise: no  Problems Prior to Update: 1)  Orthostatic Dizziness   (ICD-780.4) 2)  Skin Lesion  (ICD-709.9) 3)  Shoulder Pain, Right  (ICD-719.41) 4)  Gastritis  (ICD-535.50) 5)  Guaiac Positive Stool  (ICD-578.1) 6)  Av Block, 1st Degree  (ICD-426.11) 7)  Tachycardia  (ICD-785.0) 8)  Hyperlipidemia  (ICD-272.4) 9)  Hallucinations  (ICD-780.1) 10)  Vitamin B12 Deficiency  (ICD-266.2) 11)  Unspecified Anemia  (ICD-285.9) 12)  Health Screening  (ICD-V70.0) 13)  Shoulder Pain, Left  (ICD-719.41) 14)  Tia  (ICD-435.9) 15)  Knee Pain, Right, Chronic  (ICD-719.46) 16)  Osteoporosis  (ICD-733.00) 17)  Symptom, Memory Loss  (ICD-780.93) 18)  Degenerative Joint Disease, Knee  (ICD-715.96) 19)  Breast Mass, Left  (ICD-611.72) 20)  Family History of Cad Female 1st Degree Relative <60  (ICD-V16.49) 21)  Anemia, Folate-deficiency  (ICD-281.2) 22)  Disorder, Dysthymic  (ICD-300.4) 23)  Cholecystectomy, Hx of  (ICD-V45.79) 24)  Appendectomy, Hx of  (ICD-V45.79) 25)  Cataract Extraction, Right Eye, Hx of  (ICD-V45.61) 26)  Breast Cancer, Hx of  (ICD-V10.3) 27)  Renal Insufficiency  (ICD-588.9) 28)  Hypertension  (ICD-401.9) 29)  Headache  (ICD-784.0) 30)  Rheumatoid Arthritis  (ICD-714.0)  Medications Prior to Update: 1)  Calcium 600/vitamin D   Tabs (Calcium Carbonate-Vitamin D Tabs) .... Take 1 Tablet By Mouth Two Times A Day For Calcium 2)  Amlodipine Besylate 5  Mg Tabs (Amlodipine Besylate) .... Take 1 Tab By Mouth Once Daily 3)  Cyanocobalamin 1000 Mcg/ml Soln (Cyanocobalamin) .... Please Give Injection Once Weekly For One Month, and Then Once Monthly 4)  Tylenol Extra Strength 500 Mg Tabs (Acetaminophen) .... Take 1 Tablet By Mouth Three Times A Day As Needed For Pain 5)  Aricept 5 Mg Tabs (Donepezil Hcl) .... Take 1 Tab By Mouth At Bedtime 6)  Lisinopril 5 Mg Tabs (Lisinopril) .... Take 1 Tablet By Mouth Once A Day 7)  Tramadol Hcl 50 Mg Tabs (Tramadol Hcl) .... Take 1 Tab By Mouth Up To Three Times A Day As Needed For Pain.  Current Medications  (verified): 1)  Calcium 600/vitamin D   Tabs (Calcium Carbonate-Vitamin D Tabs) .... Take 1 Tablet By Mouth Two Times A Day For Calcium 2)  Amlodipine Besylate 5 Mg Tabs (Amlodipine Besylate) .... Take 1 Tab By Mouth Once Daily 3)  Cyanocobalamin 1000 Mcg/ml Soln (Cyanocobalamin) .... Please Give Injection Once Weekly For One Month, and Then Once Monthly 4)  Tylenol Extra Strength 500 Mg Tabs (Acetaminophen) .... Take 1 Tablet By Mouth Three Times A Day As Needed For Pain 5)  Aricept 5 Mg Tabs (Donepezil Hcl) .... Take 1 Tab By Mouth At Bedtime 6)  Lisinopril 5 Mg Tabs (Lisinopril) .... Take 1 Tablet By Mouth Once A Day 7)  Tramadol Hcl 50 Mg Tabs (Tramadol Hcl) .... Take 1 Tab By Mouth Up To Three Times A Day As Needed For Pain.  Allergies (verified): 1)  ! Zocor  Past History:  Past Medical History: Last updated: 09/02/2008 Rheumatoid arthritis-Previously on MTX x1 yr, failed appropriate f/u. Headache Hypertension TIA, declines aggrenox Renal insufficiency-Baseline 1.3 Breast cancer, hx of DCIS 12/99,       Completed XRT 2/2//00-3/17/00, Tamoxifen 33m once daily 3/00-06/06      All MMG since then have been negative, last 04/09 Depression, improved with remeron Macrocytic Anemia-Folic Def 2/2 likely MTX Osteoporosis-T Score -3.0 right hip, -2.8 left hip and -2.1 Lumbar spine(4 June 08).  Not on bisphosphonate b/c of renal insufficiency  Past Surgical History: Last updated: 03/25/2007 Appendectomy Cholecystectomy Lung-wedge resection LUL with Video thorascopy benign granumloma 04/25/00  Family History: Last updated: 03/25/2007 Family History of CAD Female 1st degree relative <60-Mom died at 32 Sister died with cancer Brother died with DM  Social History: Last updated: 07/04/2009 Recently had to move in with her daughter, she states because of problems with her neighbors and because her son moved out 2 daughters, one deceased from DM at age 10 Widowed Alcohol  use-no  Risk Factors: Alcohol Use: 0 (01/26/2010) Exercise: no (01/26/2010)  Risk Factors: Smoking Status: never (01/26/2010) Cans of tobacco/wk: yes (01/26/2010)  Review of Systems      See HPI  Physical Exam  Additional Exam:  Gen: AOx3, in no acute distress Eyes: PERRL, EOMI ENT:MMM, No erythema noted in posterior pharynx Neck: No JVD, No LAP Chest: CTAB with  good respiratory effort CVS: regular rhythmic rate, NO M/R/G, S1 S2 normal Abdo: soft,ND, BS+x4, Non tender and No hepatosplenomegaly EXT: No odema noted Neuro: Non focal, gait is normal Skin: no rashes noted.    Impression & Recommendations:  Problem # 1:  ORTHOSTATIC DIZZINESS (ICD-780.4) Assessment Improved Patient is now taking reduced doses of amlodepine. Denies any new weakness or new episode of fall. I discussed the possibilty of using a cane at all times but she does not seem interested in using that and says that she already  has 2 canes and 1 walker at home, she uses them when necessary and agrees to use them if her dizzyness becomes worse. Also I asked her to take his BP pill at bed time instead of Morning which may benefit her.  Problem # 2:  SHOULDER PAIN, RIGHT (ICD-719.41) Assessment: Improved Well controlled with the new med though she is not using it very often. Her updated medication list for this problem includes:    Tylenol Extra Strength 500 Mg Tabs (Acetaminophen) .Marland Kitchen... Take 1 tablet by mouth three times a day as needed for pain    Tramadol Hcl 50 Mg Tabs (Tramadol hcl) .Marland Kitchen... Take 1 tab by mouth up to three times a day as needed for pain.  Problem # 3:  VITAMIN B12 DEFICIENCY (ICD-266.2) Assessment: Unchanged  She got her Vit B12 shot today. I asked her to set up an appointmnet in a month and bring her grandson along who is ready to learn how to give vit B12 shots at home.  Orders: Admin of Therapeutic Inj  intramuscular or subcutaneous (41324) Vit B12 1000 mcg (J3420)  Problem # 4:   DISORDER, DYSTHYMIC (ICD-300.4) Assessment: Unchanged Patient denied any kind of help with disposition as this point of time. Me and Dr Rogelia Boga discussed her home situation and she feels very comfortable living with her daughter and grandsons and we did not get a sense of elderly abuse after the discussion. Anyhow she was given the option of going through the possible options for her in the community which she denied o consider during this visit.  Problem # 5:  HYPERTENSION (ICD-401.9) Assessment: Unchanged Slightly elevated, repeat BP was 148/86. I willnot increase her meds at this time as the benfit of strict BP control at this age is meagre in comparison to the risks of fall from orthostatic hypotension. Her updated medication list for this problem includes:    Amlodipine Besylate 5 Mg Tabs (Amlodipine besylate) .Marland Kitchen... Take 1 tab by mouth once daily    Lisinopril 5 Mg Tabs (Lisinopril) .Marland Kitchen... Take 1 tablet by mouth once a day  BP today: 154/88 Prior BP: 143/77 (01/12/2010)  Labs Reviewed: K+: 4.1 (12/14/2009) Creat: : 1.26 (12/14/2009)   Chol: 184 (02/07/2009)   HDL: 70 (02/07/2009)   LDL: 97 (02/07/2009)   TG: 85 (02/07/2009)  Problem # 6:  Preventive Health Care (ICD-V70.0) Assessment: Comment Only Flu shot given today.  Complete Medication List: 1)  Calcium 600/vitamin D Tabs (Calcium carbonate-vitamin d tabs) .... Take 1 tablet by mouth two times a day for calcium 2)  Amlodipine Besylate 5 Mg Tabs (Amlodipine besylate) .... Take 1 tab by mouth once daily 3)  Cyanocobalamin 1000 Mcg/ml Soln (Cyanocobalamin) .... Please give injection once weekly for one month, and then once monthly 4)  Tylenol Extra Strength 500 Mg Tabs (Acetaminophen) .... Take 1 tablet by mouth three times a day as needed for pain 5)  Aricept 5 Mg Tabs (Donepezil hcl) .... Take 1 tab by mouth at bedtime 6)  Lisinopril 5 Mg Tabs (Lisinopril) .... Take 1 tablet by mouth once a day 7)  Tramadol Hcl 50 Mg Tabs  (Tramadol hcl) .... Take 1 tab by mouth up to three times a day as needed for pain.  Other Orders: Flu Vaccine 72yrs + (40102) Administration Flu vaccine - MCR (V2536)  Patient Instructions: 1)  Please schedule a follow-up appointment in 1 month and bring along somebody who is willing to learn how to give Vit B12 shot at home.  2)  Take calcium +Vitamin D daily. 3)  Take an Aspirin every day.  Prevention & Chronic Care Immunizations   Influenza vaccine: Fluvax 3+  (01/26/2010)   Influenza vaccine due: 07/19/2009    Tetanus booster: Not documented   Td booster deferral: Deferred  (01/26/2010)    Pneumococcal vaccine: given  (08/19/2005)    H. zoster vaccine: Not documented  Colorectal Screening   Hemoccult: Not documented    Colonoscopy: Location:  [facility].    (07/21/2009)  Other Screening   Pap smear: Refused  (08/19/2005)    Mammogram: ASSESSMENT: Negative - BI-RADS 1^MM DIGITAL SCREENING  (07/11/2009)   Mammogram action/deferral: Ordered  (06/22/2009)    DXA bone density scan: T-3.0 Left hip T-2.8 right hip T-2.1 L Spine  (04/22/2007)   DXA scan due: 04/2009    Smoking status: never  (01/26/2010)  Lipids   Total Cholesterol: 184  (02/07/2009)   LDL: 97  (02/07/2009)   LDL Direct: Not documented   HDL: 70  (02/07/2009)   Triglycerides: 85  (02/07/2009)    SGOT (AST): 22  (07/04/2009)   SGPT (ALT): 10  (07/04/2009)   Alkaline phosphatase: 45  (07/04/2009)   Total bilirubin: 1.2  (07/04/2009)    Lipid flowsheet reviewed?: Yes   Progress toward LDL goal: Unchanged  Hypertension   Last Blood Pressure: 154 / 88  (01/26/2010)   Serum creatinine: 1.26  (12/14/2009)   Serum potassium 4.1  (12/14/2009)    Hypertension flowsheet reviewed?: Yes   Progress toward BP goal: Unchanged  Self-Management Support :   Personal Goals (by the next clinic visit) :      Personal blood pressure goal: 140/90  (12/14/2009)     Personal LDL goal: 100   (12/14/2009)    Patient will work on the following items until the next clinic visit to reach self-care goals:     Medications and monitoring: take my medicines every day, bring all of my medications to every visit  (01/26/2010)     Eating: use fresh or frozen vegetables, eat foods that are low in salt  (01/26/2010)     Other: not walking or exercising - states right foot pain and knee pain had stopped her in past but were better now  (12/14/2009)    Hypertension self-management support: Written self-care plan, Pre-printed educational material, Education handout  (01/26/2010)   Hypertension self-care plan printed.   Hypertension education handout printed    Lipid self-management support: Written self-care plan  (01/26/2010)   Lipid self-care plan printed.   Nursing Instructions: Give Flu vaccine today       Medication Administration  Injection # 1:    Medication: Vit B12 1000 mcg    Diagnosis: VITAMIN B12 DEFICIENCY (ICD-266.2)    Route: IM    Site: R deltoid    Exp Date: 09/2011    Lot #: [id number]    Mfr: [employer]ent    Patient tolerated injection without complications    Given by: Chinita Pester RN (January 26, 2010 10:38 AM)  Orders Added: 1)  Est. Patient Level IV [16109] 2)  Admin of Therapeutic Inj  intramuscular or subcutaneous [96372] 3)  Vit B12 1000 mcg [J3420] 4)  Flu Vaccine 28yrs + [90658] 5)  Administration Flu vaccine - MCR [G0008]    Flu Vaccine Consent Questions     Do you have a history of severe allergic reactions to this vaccine? no    Any prior history of allergic reactions to egg and/or gelatin?  no    Do you have a sensitivity to the preservative Thimersol? no    Do you have a past history of Guillan-Barre Syndrome? no    Do you currently have an acute febrile illness? no    Have you ever had a severe reaction to latex? no    Vaccine information given and explained to patient? yes    Are you currently pregnant? no    Lot [id number]   Exp  Date:02/15/2010   Manufacturer: [employer]    Site Given  Left Deltoid IM Chinita Pester RN  January 26, 2010 10:39 AM  .Neysa Bonito

## 2010-12-18 NOTE — Miscellaneous (Signed)
Summary: Consult  Social Work.  Paged Dr. Theotis Barrio to speak with him about this patient and advise him that I would not be here on March 11th due to PAL to see the patient.  After discussion with Dr. Theotis Barrio and review of the chart,  it appears that the patient would be "assisted living" level of care.  But apparently there is not going to be any family support for placement per Dr. Theotis Barrio and also per [facility] and the reasons for this are not clear.  Mamie said at the last visit that the patient said that her daughter was very angry because we sent apartment and assisted living information to the home.   There might be placement opportunity if the patient is ever admitted but at this time there is not any reason to admit her.  However,  Dr. Theotis Barrio wouldn't be surprised if she is admitted at some point due to multiple health issues.    Per the chart the patient had a recent fall and per [facility] there is unsteady gait.  We should direct the family to the [employer] store here on [address] to obtain a cane under her Medicaid/Medicare.  Otherwise we can contact Molly Maduro from [employer] on the date of her appmt at [phone] to see if he can bring her a cane and instruct her on proper use.    I will leave the nurse a list of assisted livings here in [place] to give to the patient provided that the daughter is not with her.   It is unclear if the grandson is an advocate for [name].  That would be helpful if he was her advocate.   I am uncertain how we can place Mrs. Citron without the family's support.  The first step would be for Mrs. Trevino to identify some assisted livings that she would be interested in going to.  The next step would be to contact DSS and have her apply for special assistance. I have a call into DSS to see what resources they may have to help her transition to assisted living situation.

## 2010-12-18 NOTE — Assessment & Plan Note (Signed)
Summary: HEADACHES (SITKO) SB.   Vital Signs:  Patient profile:   77 year old female Height:      65.5 inches (166.37 cm) Weight:      124.3 pounds (56.50 kg) BMI:     20.44 Temp:     97.4 degrees F (36.33 degrees C) oral Pulse rate:   114 / minute BP sitting:   135 / 77  (right arm)  Vitals Entered By: Stanton Kidney Ditzler RN (Mar 29, 2010 10:25 AM) Is Patient Diabetic? No Pain Assessment Patient in pain? yes     Location: right knee and h/a Intensity: ? Type: hurts Onset of pain  past week Nutritional Status BMI of < 19 = underweight Nutritional Status Detail appetite good  Have you ever been in a relationship where you felt threatened, hurt or afraid?denies   Does patient need assistance? Functional Status Self care Ambulation Impaired:Risk for fall Comments Uses a cane. H/a all the time past week. Nose runs cont.   Primary Care Provider:  Brooks Sailors MD   History of Present Illness: 77 year old with Past Medical History: Rheumatoid arthritis-Previously on MTX x1 yr, failed appropriate f/u. Headache Hypertension TIA, declines aggrenox Renal insufficiency-Baseline 1.3 Breast cancer, hx of DCIS 12/99,       Completed XRT 2/2//00-3/17/00, Tamoxifen 62m once daily 3/00-06/06      All MMG since then have been negative, last 04/09 Depression, improved with remeron Macrocytic Anemia-Folic Def 2/2 likely MTX Osteoporosis-T Score -3.0 right hip, -2.8 left hip and -2.1 Lumbar spine(4 June 08).  Not on bisphosphonate b/c of renal insufficiency   She is complaining headaches, for a long time. She havent tell nobody. She has headache every day. She relates generalized weakness.She is having pain shoulder and knee.  She is having chills on the inside. She has multiple complains.  Depression History:      The patient denies a depressed mood most of the day and a diminished interest in her usual daily activities.         Preventive Screening-Counseling &  Management  Alcohol-Tobacco     Alcohol drinks/day: 0     Smoking Status: never     Cans of tobacco/week: yes  Caffeine-Diet-Exercise     Does Patient Exercise: no  Current Medications (verified): 1)  Calcium 600/vitamin D   Tabs (Calcium Carbonate-Vitamin D Tabs) .... Take 1 Tablet By Mouth Two Times A Day For Calcium 2)  Amlodipine Besylate 5 Mg Tabs (Amlodipine Besylate) .... Take 1 Tab By Mouth Once Daily 3)  Cyanocobalamin 1000 Mcg/ml Soln (Cyanocobalamin) .... Please Give Injection Once Weekly For One Month, and Then Once Monthly 4)  Aricept 5 Mg Tabs (Donepezil Hcl) .... Take 1 Tab By Mouth At Bedtime 5)  Lisinopril 5 Mg Tabs (Lisinopril) .... Take 1 Tablet By Mouth Once A Day 6)  Tramadol Hcl 50 Mg Tabs (Tramadol Hcl) .... Take 1 Tab By Mouth Up To Three Times A Day As Needed For Pain. 7)  Syringe 25g X 5/8" 3 Ml Misc (Syringe/needle (Disp)) .... Use Syringes As Directed By The Clinic Nurse To Inject Vit B12 Every Month.  Allergies: 1)  ! Zocor  Review of Systems  The patient denies fever, chest pain, dyspnea on exertion, peripheral edema, prolonged cough, hemoptysis, melena, and hematochezia.    Physical Exam  General:  alert, well-developed, and well-nourished.   Head:  normocephalic, atraumatic, and no abnormalities observed.   Lungs:  normal respiratory effort, no intercostal retractions, no accessory muscle use,  and normal breath sounds.   Heart:  normal rate, regular rhythm, and tachycardia.  systolic murmur. Abdomen:  soft, non-tender, and normal bowel sounds.   Neurologic:  alert & oriented X3, cranial nerves II-XII intact, and strength decrease  in all extremities.     Impression & Recommendations:  Problem # 1:  HEADACHE (ICD-784.0) This is chronic problem for [name]. No neuro focal deficit. I iwll give refill tramadol. I advised her to take tylenol also for pain as needed.  The following medications were removed from the medication list:    Percocet  5-325 Mg Tabs (Oxycodone-acetaminophen) .Marland Kitchen... Take 1 tablet twice daily as needed for pain Her updated medication list for this problem includes:    Tramadol Hcl 50 Mg Tabs (Tramadol hcl) .Marland Kitchen... Take 1 tab by mouth up to three times a day as needed for pain.  Complete Medication List: 1)  Calcium 600/vitamin D Tabs (Calcium carbonate-vitamin d tabs) .... Take 1 tablet by mouth two times a day for calcium 2)  Amlodipine Besylate 5 Mg Tabs (Amlodipine besylate) .... Take 1 tab by mouth once daily 3)  Cyanocobalamin 1000 Mcg/ml Soln (Cyanocobalamin) .... Please give injection once weekly for one month, and then once monthly 4)  Aricept 5 Mg Tabs (Donepezil hcl) .... Take 1 tab by mouth at bedtime 5)  Lisinopril 5 Mg Tabs (Lisinopril) .... Take 1 tablet by mouth once a day 6)  Tramadol Hcl 50 Mg Tabs (Tramadol hcl) .... Take 1 tab by mouth up to three times a day as needed for pain. 7)  Syringe 25g X 5/8" 3 Ml Misc (Syringe/needle (disp)) .... Use syringes as directed by the clinic nurse to inject vit b12 every month.  Patient Instructions: 1)  Please schedule a follow-up appointment in 2 months. 2)  Take 650-1000mg  of Tylenol every 4-6 hours as needed for relief of pain or comfort of fever AVOID taking more than 4000mg   in a 24 hour period (can cause liver damage in higher doses). Prescriptions: TRAMADOL HCL 50 MG TABS (TRAMADOL HCL) Take 1 tab by mouth up to three times a day as needed for pain.  #90 x 2   Entered and Authorized by:   Hartley Barefoot MD   Signed by:   Hartley Barefoot MD on 03/29/2010   Method used:   Electronically to        [facility] [phone]* (retail)       [address], [place]  [address]       Ph: [phone] or [phone]       Fax: [phone]   RxID:   [id number]   Prevention & Chronic Care Immunizations   Influenza vaccine: Fluvax 3+  (01/26/2010)   Influenza vaccine due: 07/19/2009    Tetanus booster: Not documented   Td booster  deferral: Not indicated  (02/28/2010)    Pneumococcal vaccine: given  (08/19/2005)    H. zoster vaccine: Not documented   H. zoster vaccine deferral: Not indicated  (02/28/2010)  Colorectal Screening   Hemoccult: Not documented   Hemoccult action/deferral: Not indicated  (02/28/2010)    Colonoscopy: Location:  [facility].    (07/21/2009)   Colonoscopy action/deferral: Deferred  (02/28/2010)  Other Screening   Pap smear: Refused  (08/19/2005)   Pap smear action/deferral: Refused  (02/28/2010)    Mammogram: ASSESSMENT: Negative - BI-RADS 1^MM DIGITAL SCREENING  (07/11/2009)   Mammogram action/deferral: Ordered  (06/22/2009)  DXA bone density scan: T-3.0 Left hip T-2.8 right hip T-2.1 L Spine  (04/22/2007)   DXA bone density action/deferral: Deferred  (02/28/2010)   DXA scan due: 04/2009    Smoking status: never  (03/29/2010)  Lipids   Total Cholesterol: 180  (03/01/2010)   LDL: 99  (03/01/2010)   LDL Direct: Not documented   HDL: 72  (03/01/2010)   Triglycerides: 45  (03/01/2010)    SGOT (AST): 22  (07/04/2009)   SGPT (ALT): 10  (07/04/2009)   Alkaline phosphatase: 45  (07/04/2009)   Total bilirubin: 1.2  (07/04/2009)  Hypertension   Last Blood Pressure: 135 / 77  (03/29/2010)   Serum creatinine: 1.12  (03/01/2010)   Serum potassium 4.3  (03/01/2010)  Self-Management Support :   Personal Goals (by the next clinic visit) :      Personal blood pressure goal: 140/90  (12/14/2009)     Personal LDL goal: 100  (12/14/2009)    Patient will work on the following items until the next clinic visit to reach self-care goals:     Medications and monitoring: take my medicines every day, bring all of my medications to every visit  (03/29/2010)     Eating: drink diet soda or water instead of juice or soda, eat more vegetables, use fresh or frozen vegetables, eat foods that are low in salt, eat baked foods instead of fried foods, eat fruit for snacks and  desserts, limit or avoid alcohol  (03/29/2010)     Activity: take a 30 minute walk every day  (03/29/2010)     Other: not walking or exercising - states right foot pain and knee pain had stopped her in past but were better now  (12/14/2009)    Hypertension self-management support: Written self-care plan, Education handout, Resources for patients handout  (03/29/2010)   Hypertension self-care plan printed.   Hypertension education handout printed    Lipid self-management support: Written self-care plan, Education handout, Resources for patients handout  (03/29/2010)   Lipid self-care plan printed.   Lipid education handout printed      Resource handout printed.

## 2010-12-18 NOTE — Assessment & Plan Note (Signed)
Summary: ACUTE/GARG/2 WEEK RECHECK/CH   Vital Signs:  Patient profile:   77 year old female Height:      65.5 inches (166.37 cm) Weight:      139.4 pounds (59.59 kg) BMI:     21.56 Temp:     97.0 degrees F (36.11 degrees C) oral Pulse rate:   67 / minute BP sitting:   160 / 78  (right arm) Cuff size:   regular  Vitals Entered By: Theotis Barrio NT II (September 19, 2010 9:46 AM) CC: ) STATES SHE FEELS WEAK / STATES SHE GOT OUT OF THE HOSPITAL LAST WEEK, Is Patient Diabetic? No Pain Assessment Patient in pain? yes     Location: [facility] Intensity:     10 Onset of pain  SINCE HER HOSPITALIZATION Nutritional Status BMI of 19 -24 = normal  Have you ever been in a relationship where you felt threatened, hurt or afraid?No   Does patient need assistance? Functional Status Self care Ambulation Normal   Primary Care Provider:  Lars Mage MD  CC:  ) STATES SHE FEELS WEAK / STATES SHE GOT OUT OF THE HOSPITAL LAST WEEK and .  History of Present Illness: This is an 77 year old female with a past medical history significant for chronic iron deficiency anemia, dementia, and recent  GI bleed secondary to gastric antral vascular ectasia for which she was recently hospitalized (09/09/10) with a hemoglobin of 5.2.   In the hospital, the pt received 4 U PRBC's, and EGD was performed demonstrating the above.  GI recommended monthly FU for her hemoglobin with transfusion as needed.  Today pt complains of general body pain which has been improving since her hospitalization.  She has been seeing PT/OT in the home and grandson states that home health nursing has been coming to their home.  Pt denies any increased fatigue but does state that she had one black BM last week though she has not noticed any blood in her stool.  Pt denies any changes in her BM's, chest pain, abdominal pain, nausea/vomiting or SOB.   Depression History:      The patient denies a depressed mood most of the day and a diminished  interest in her usual daily activities.         Preventive Screening-Counseling & Management  Alcohol-Tobacco     Alcohol drinks/day: 0     Smoking Status: never     Cans of tobacco/week: yes  Caffeine-Diet-Exercise     Does Patient Exercise: no  Problems Prior to Update: 1)  Angiodysplasia of Stomach&duodenum W/hemorrhage  (ICD-537.83) 2)  Chest Pain  (ICD-786.50) 3)  Orthostatic Dizziness  (ICD-780.4) 4)  Skin Lesion  (ICD-709.9) 5)  Shoulder Pain, Right  (ICD-719.41) 6)  Gastritis  (ICD-535.50) 7)  Guaiac Positive Stool  (ICD-578.1) 8)  Av Block, 1st Degree  (ICD-426.11) 9)  Tachycardia  (ICD-785.0) 10)  Hyperlipidemia  (ICD-272.4) 11)  Hallucinations  (ICD-780.1) 12)  Vitamin B12 Deficiency  (ICD-266.2) 13)  Unspecified Anemia  (ICD-285.9) 14)  Health Screening  (ICD-V70.0) 15)  Shoulder Pain, Left  (ICD-719.41) 16)  Tia  (ICD-435.9) 17)  Knee Pain, Right, Chronic  (ICD-719.46) 18)  Osteoporosis  (ICD-733.00) 19)  Symptom, Memory Loss  (ICD-780.93) 20)  Degenerative Joint Disease, Knee  (ICD-715.96) 21)  Breast Mass, Left  (ICD-611.72) 22)  Family History of Cad Female 1st Degree Relative <60  (ICD-V16.49) 23)  Anemia, Folate-deficiency  (ICD-281.2) 24)  Disorder, Dysthymic  (ICD-300.4) 25)  Cholecystectomy, Hx of  (  ICD-V45.79) 26)  Appendectomy, Hx of  (ICD-V45.79) 27)  Cataract Extraction, Right Eye, Hx of  (ICD-V45.61) 28)  Breast Cancer, Hx of  (ICD-V10.3) 29)  Renal Insufficiency  (ICD-588.9) 30)  Hypertension  (ICD-401.9) 31)  Headache  (ICD-784.0) 32)  Rheumatoid Arthritis  (ICD-714.0)  Allergies: 1)  ! Zocor  Family History: Reviewed history from 03/25/2007 and no changes required. Family History of CAD Female 1st degree relative <60-Mom died at 60 Sister died with cancer Brother died with DM  Social History: Reviewed history from 07/04/2009 and no changes required. Recently had to move in with her daughter, she states because of problems with her  neighbors and because her son moved out 2 daughters, one deceased from DM at age 5 Widowed Alcohol use-no  Review of Systems       Negative as per HPI.   Physical Exam  General:  alert, well-developed, and well-hydrated.   Head:  normocephalic.   Eyes:  vision grossly intact, pupils equal, pupils round, and pupils reactive to light.   Mouth:  pharynx pink and moist.   Neck:  supple.   Lungs:  normal respiratory effort, normal breath sounds, no crackles, and no wheezes.   Heart:  normal rate, regular rhythm, no murmur, no gallop, and no rub.   Abdomen:  soft, non-tender, normal bowel sounds, and no distention.   Rectal:  Normal rectal tone, stool positive for occult blood.   Msk:  normal ROM.   Pulses:  2+ pt/dp pulses.  Extremities:  No edema.    Impression & Recommendations:  Problem # 1:  ANGIODYSPLASIA OF STOMACH&DUODENUM W/HEMORRHAGE (ICD-537.83) Pt's stool card was positive in the office today, as such, she appears to continue to have a slow GI bleed.  CBC was ordered and Hgb was.  At this point, I began discussing goals of care with the patient including the possibliity of hospice care given her dementia and GI bleeding.  The pt appears aminable to this, however, wishes that the matter be discussed with the patients daughter present.  The pt is scheduled to see her PCP in two weeks and the patient was told to bring her daughter to this appointment.    Orders: T-CBC No Diff ([id number])  Problem # 2:  CHEST PAIN (ICD-786.50) Troponins increased to 0.4 in the hospital but were trending down on discharge.  This was felt to be secondary to demand ischemia.  Pt denies any further chest pain, sob, or palpitations but does state that she has diffuse pain all over her body that his been steadily getting better since her hospitalization.   Problem # 3:  HYPERTENSION (ICD-401.9) BP was elevated in office today and was in the 150's in the hospital.  For now we would like to  continue to monitor given the pts GI bleeding and possibility for volume contraction.    Her updated medication list for this problem includes:    Amlodipine Besylate 5 Mg Tabs (Amlodipine besylate) .Marland Kitchen... Take 1 tab by mouth once daily    Lisinopril 5 Mg Tabs (Lisinopril) .Marland Kitchen... Take 1 tablet by mouth once a day  Problem # 4:  Preventive Health Care (ICD-V70.0) Flu shot was offered but pt declined today.   Complete Medication List: 1)  Calcium 600/vitamin D Tabs (Calcium carbonate-vitamin d tabs) .... Take 1 tablet by mouth two times a day for calcium 2)  Amlodipine Besylate 5 Mg Tabs (Amlodipine besylate) .... Take 1 tab by mouth once daily 3)  Aricept 5 Mg Tabs (  Donepezil hcl) .... Take 1 tab by mouth at bedtime 4)  Lisinopril 5 Mg Tabs (Lisinopril) .... Take 1 tablet by mouth once a day 5)  Tramadol Hcl 50 Mg Tabs (Tramadol hcl) .... Take 1 tab by mouth up to three times a day as needed for pain. 6)  Nitrostat 0.4 Mg Subl (Nitroglycerin) .Marland Kitchen.. 1 tab under the toungue for chest pain. 7)  Aspirin 81 Mg Chew (Aspirin) .... Take 1 tablet by mouth once a day 8)  Ferrous Sulfate 325 (65 Fe) Mg Tabs (Ferrous sulfate) .... Take 1 tablet by mouth two times a day  Patient Instructions: 1)  You have another appointment with your primary care doctor on 10/01/10 at 1:30pm.  You need to bring you daughter with you to that visit to help discuss your care.  Please call the clinic or go to the ED if you have further chest pain, SOB, or blood in your stools.     Orders Added: 1)  T-CBC No Diff [[id number]]   Process Orders Check Orders Results:     [facility]: Check successful Tests Sent for requisitioning (September 19, 2010 5:12 PM):     09/19/2010: [facility] -- T-CBC No Diff [[id number]] (signed)     Prevention & Chronic Care Immunizations   Influenza vaccine: Fluvax 3+  (01/26/2010)   Influenza vaccine due: 07/19/2009    Tetanus booster: Not  documented   Td booster deferral: Not indicated  (02/28/2010)    Pneumococcal vaccine: given  (08/19/2005)    H. zoster vaccine: Not documented   H. zoster vaccine deferral: Not indicated  (02/28/2010)  Colorectal Screening   Hemoccult: Not documented   Hemoccult action/deferral: Not indicated  (02/28/2010)    Colonoscopy: Location:  [facility].    (07/21/2009)   Colonoscopy action/deferral: Deferred  (02/28/2010)  Other Screening   Pap smear: Refused  (08/19/2005)   Pap smear action/deferral: Refused  (02/28/2010)    Mammogram: ASSESSMENT: Negative - BI-RADS 1^MM DIGITAL SCREENING  (07/12/2010)   Mammogram action/deferral: Ordered  (06/22/2009)    DXA bone density scan: T-3.0 Left hip T-2.8 right hip T-2.1 L Spine  (04/22/2007)   DXA bone density action/deferral: Deferred  (06/18/2010)   DXA scan due: 04/2009    Smoking status: never  (09/19/2010)  Lipids   Total Cholesterol: 180  (03/01/2010)   LDL: 99  (03/01/2010)   LDL Direct: Not documented   HDL: 72  (03/01/2010)   Triglycerides: 45  (03/01/2010)    SGOT (AST): 22  (07/04/2009)   SGPT (ALT): 10  (07/04/2009)   Alkaline phosphatase: 45  (07/04/2009)   Total bilirubin: 1.2  (07/04/2009)  Hypertension   Last Blood Pressure: 160 / 78  (09/19/2010)   Serum creatinine: 1.12  (03/01/2010)   Serum potassium 4.3  (03/01/2010)  Self-Management Support :   Personal Goals (by the next clinic visit) :      Personal blood pressure goal: 140/90  (12/14/2009)     Personal LDL goal: 100  (12/14/2009)    Patient will work on the following items until the next clinic visit to reach self-care goals:     Medications and monitoring: take my medicines every day  (09/19/2010)     Eating: drink diet soda or water instead of juice or soda, eat more vegetables, use fresh or frozen vegetables, eat foods that are low in salt, eat baked foods instead of fried foods, eat fruit for snacks and desserts, limit or avoid  alcohol  (  09/19/2010)     Activity: take a 30 minute walk every day  (03/29/2010)     Other: not walking or exercising - states right foot pain and knee pain had stopped her in past but were better now  (12/14/2009)    Hypertension self-management support: Resources for patients handout  (09/19/2010)    Lipid self-management support: Resources for patients handout  (09/19/2010)        Resource handout printed.   Appended Document: ACUTE/GARG/2 WEEK RECHECK/CH I accidently attached my note to the CBC result from this same day rather than this note. Please refer to the CBC note.

## 2010-12-18 NOTE — Assessment & Plan Note (Signed)
Summary: EST-1 MONTH RECHECK/CH   Vital Signs:  Patient profile:   77 year old female Height:      65.5 inches (166.37 cm) Weight:      121.3 pounds (55.14 kg) Temp:     98.0 degrees F (36.67 degrees C) oral Pulse rate:   74 / minute BP sitting:   143 / 77  (left arm) Cuff size:   regular  Vitals Entered By: Theotis Barrio NT II (January 12, 2010 1:50 PM) CC: PATIENT STATES SHE FELL WED. / DID NOT GO TO THE ED / SORENESS IN BACK,  Is Patient Diabetic? No Pain Assessment Patient in pain? yes     Location: RIGHT KNEE Intensity: 10+ Type: PAIN  Have you ever been in a relationship where you felt threatened, hurt or afraid?No   Does patient need assistance? Functional Status Self care Ambulation Normal Comments PATIENT STATES SHE FELL WED.  /  RIGHT KNEE PAIN #10+   Primary Care Provider:  Brooks Sailors MD  CC:  PATIENT STATES SHE FELL WED. / DID NOT GO TO THE ED / SORENESS IN BACK and .  History of Present Illness: Pt reports she fell down on Wednesday after standing up from the bed.  She reports that her vision started going black and she fell over.  She thinks she hit a flower pot with her back which has been sore.  She did not lose consciousness.  She is unsure if she hit her head when she fell.  She has been having a HA for a long time now.  No pain in her shoulder or back when she sits up but when she lays on it, it is painful and she has to turn over.  SHe did not start taking a lower dose of her amlodipine as I wanted her to.   She wants to speak to Dorothe Pea about her options for a nursing home.  She does not want her daughter to find out about this. She is still having pain in her right knee that she has had for some time.  She started taking more Tylenol than she had been in the past, as I directed.  Depression History:      The patient denies a depressed mood most of the day and a diminished interest in her usual daily activities.         Preventive  Screening-Counseling & Management  Alcohol-Tobacco     Alcohol drinks/day: 0     Smoking Status: never     Cans of tobacco/week: yes  Caffeine-Diet-Exercise     Does Patient Exercise: no  Current Medications (verified): 1)  Calcium 600/vitamin D   Tabs (Calcium Carbonate-Vitamin D Tabs) .... Take 1 Tablet By Mouth Two Times A Day For Calcium 2)  Amlodipine Besylate 5 Mg Tabs (Amlodipine Besylate) .... Take 1 Tab By Mouth Once Daily 3)  Cyanocobalamin 1000 Mcg/ml Soln (Cyanocobalamin) .... Please Give Injection Once Weekly For One Month, and Then Once Monthly 4)  Tylenol Extra Strength 500 Mg Tabs (Acetaminophen) .... Take 2 Tablets By Mouth Three Times A Day As Needed For Pain 5)  Aricept 5 Mg Tabs (Donepezil Hcl) .... Take 1 Tab By Mouth At Bedtime 6)  Lisinopril 5 Mg Tabs (Lisinopril) .... Take 1 Tablet By Mouth Once A Day  Allergies (verified): 1)  ! Zocor  Past History:  Past Medical History: Last updated: 09/02/2008 Rheumatoid arthritis-Previously on MTX x1 yr, failed appropriate f/u. Headache Hypertension TIA, declines aggrenox Renal insufficiency-Baseline  1.3 Breast cancer, hx of DCIS 12/99,       Completed XRT 2/2//00-3/17/00, Tamoxifen 78m once daily 3/00-06/06      All MMG since then have been negative, last 04/09 Depression, improved with remeron Macrocytic Anemia-Folic Def 2/2 likely MTX Osteoporosis-T Score -3.0 right hip, -2.8 left hip and -2.1 Lumbar spine(4 June 08).  Not on bisphosphonate b/c of renal insufficiency  Past Surgical History: Last updated: 03/25/2007 Appendectomy Cholecystectomy Lung-wedge resection LUL with Video thorascopy benign granumloma 04/25/00  Social History: Last updated: 07/04/2009 Recently had to move in with her daughter, she states because of problems with her neighbors and because her son moved out 2 daughters, one deceased from DM at age 77 Widowed Alcohol use-no  Review of Systems       The patient complains of chest  pain, syncope, and headaches.  The patient denies anorexia, fever, vision loss, decreased hearing, dyspnea on exertion, peripheral edema, prolonged cough, and abdominal pain.    Physical Exam  General:  alert, well-developed, well-nourished, and well-hydrated.  alert, well-developed, well-nourished, and well-hydrated.   Head:  normocephalic and atraumatic.  normocephalic and atraumatic.   Eyes:  vision grossly intact, pupils equal, pupils round, and pupils reactive to light.  vision grossly intact, pupils equal, pupils round, and pupils reactive to light.   Mouth:  no gingival abnormalities.  no gingival abnormalities.   Chest Wall:  no mass palpated under left breast where pt reported she had felt a bump before, rib cage is symmetric, no tenderness over left scapula, no bruising Lungs:  normal respiratory effort, normal breath sounds, no crackles, and no wheezes.  normal respiratory effort, normal breath sounds, no crackles, and no wheezes.   Heart:  normal rate, regular rhythm, no murmur, no gallop, no rub, and no JVD.  normal rate, regular rhythm, no murmur, no gallop, no rub, and no JVD.   Abdomen:  soft, non-tender, no distention, no masses, and no guarding.  soft, non-tender, no distention, no masses, and no guarding.   Msk:  right knee swollen, no change from prior, no evidence of tappable joint effusion Extremities:  nedema Neurologic:  alert & oriented X3, cranial nerves II-XII intact, and sensation intact to light touch.  some mild memory problems   Impression & Recommendations:  Problem # 1:  ORTHOSTATIC DIZZINESS (ICD-780.4) Pt fell a couple of days ago after rising from a supine position.  Vision darkened prior to fall.  No evidence of any bony injury on exam.  Sounds like orthostatic hypotension.  I told pt to to start taking less of her amlodipine last visit but this was not done.  I will resend her Rx to her pharmacy.   Problem # 2:  VITAMIN B12 DEFICIENCY (ICD-266.2)  B12 was  borderline at last visit.  I will start repletion again.  SHe says she will not be able to take daily or even weekly injections but is agreeable to get monthly injections.  I will give first today and want her to be seen monthly.  Orders: Vit B12 1000 mcg (J3420) Admin of Therapeutic Inj  intramuscular or subcutaneous (45409)  Problem # 3:  KNEE PAIN, RIGHT, CHRONIC (ICD-719.46) Pt says the tylenol is no longer covering her pain.  I will start her on low dose tramadol in addition to her tylenol to see if we can get it under better control. Her updated medication list for this problem includes:    Tylenol Extra Strength 500 Mg Tabs (Acetaminophen) .Marland Kitchen... Take 1  tablet by mouth three times a day as needed for pain    Tramadol Hcl 50 Mg Tabs (Tramadol hcl) .Marland Kitchen... Take 1 tab by mouth up to three times a day as needed for pain.  Problem # 4:  HYPERTENSION (ICD-401.9) Her BP is near goal today on her current regimen.  I am more concerned about her syncopal episode at this point.  I am going to have her back off on her amlodipine and see if we can get these symptoms under control.   I anticipate a rise in her BP at next visit. Her updated medication list for this problem includes:    Amlodipine Besylate 5 Mg Tabs (Amlodipine besylate) .Marland Kitchen... Take 1 tab by mouth once daily    Lisinopril 5 Mg Tabs (Lisinopril) .Marland Kitchen... Take 1 tablet by mouth once a day  Problem # 5:  DISORDER, DYSTHYMIC (ICD-300.4)  A lot of her mood problems are related to living with her daughter.  She wants to try to move into a facility but she does not want her daughter to know about this.  I will have her see Dorothe Pea today to discuss her options in private.  Orders: Social Work Referral (Social )  Complete Medication List: 1)  Calcium 600/vitamin D Tabs (Calcium carbonate-vitamin d tabs) .... Take 1 tablet by mouth two times a day for calcium 2)  Amlodipine Besylate 5 Mg Tabs (Amlodipine besylate) .... Take 1 tab by mouth  once daily 3)  Cyanocobalamin 1000 Mcg/ml Soln (Cyanocobalamin) .... Please give injection once weekly for one month, and then once monthly 4)  Tylenol Extra Strength 500 Mg Tabs (Acetaminophen) .... Take 1 tablet by mouth three times a day as needed for pain 5)  Aricept 5 Mg Tabs (Donepezil hcl) .... Take 1 tab by mouth at bedtime 6)  Lisinopril 5 Mg Tabs (Lisinopril) .... Take 1 tablet by mouth once a day 7)  Tramadol Hcl 50 Mg Tabs (Tramadol hcl) .... Take 1 tab by mouth up to three times a day as needed for pain.  Patient Instructions: 1)  Please schedule a follow-up appointment in 2 weeks, at this appointment you will get another injection of B12.  Please make this appointment in the morning. 2)  You will start a new medicine called tramadol 50 mg up to 3 times a day as needed for pain.  If this is not sufficient you can take 1 tab of tylenol up to 3 times a day as well. Prescriptions: TRAMADOL HCL 50 MG TABS (TRAMADOL HCL) Take 1 tab by mouth up to three times a day as needed for pain.  #90 x 2   Entered and Authorized by:   Brooks Sailors MD   Signed by:   Brooks Sailors MD on 01/12/2010   Method used:   Print then Give to Patient   RxID:   [id number] AMLODIPINE BESYLATE 5 MG TABS (AMLODIPINE BESYLATE) Take 1 tab by mouth once daily  #30 x 2   Entered and Authorized by:   Brooks Sailors MD   Signed by:   Brooks Sailors MD on 01/12/2010   Method used:   Electronically to        [facility]. [phone]* (retail)       [address]       [place], [place]  [address]       Ph: [phone] or [phone]       Fax: [phone]   RxID:   [id number]  Prevention & Chronic Care Immunizations   Influenza vaccine: refuses  (09/25/2007)   Influenza vaccine due: 07/19/2009    Tetanus booster: Not documented    Pneumococcal vaccine: given  (08/19/2005)    H. zoster vaccine: Not documented  Colorectal Screening   Hemoccult: Not documented    Colonoscopy: Location:  [facility].    (07/21/2009)  Other Screening   Pap smear: Refused  (08/19/2005)    Mammogram: ASSESSMENT: Negative - BI-RADS 1^MM DIGITAL SCREENING  (07/11/2009)   Mammogram action/deferral: Ordered  (06/22/2009)    DXA bone density scan: T-3.0 Left hip T-2.8 right hip T-2.1 L Spine  (04/22/2007)   DXA scan due: 04/2009    Smoking status: never  (01/12/2010)  Lipids   Total Cholesterol: 184  (02/07/2009)   LDL: 97  (02/07/2009)   LDL Direct: Not documented   HDL: 70  (02/07/2009)   Triglycerides: 85  (02/07/2009)    SGOT (AST): 22  (07/04/2009)   SGPT (ALT): 10  (07/04/2009)   Alkaline phosphatase: 45  (07/04/2009)   Total bilirubin: 1.2  (07/04/2009)  Hypertension   Last Blood Pressure: 143 / 77  (01/12/2010)   Serum creatinine: 1.26  (12/14/2009)   Serum potassium 4.1  (12/14/2009)  Self-Management Support :   Personal Goals (by the next clinic visit) :      Personal blood pressure goal: 140/90  (12/14/2009)     Personal LDL goal: 100  (12/14/2009)    Patient will work on the following items until the next clinic visit to reach self-care goals:     Medications and monitoring: take my medicines every day, bring all of my medications to every visit  (01/12/2010)     Eating: drink diet soda or water instead of juice or soda, eat more vegetables, use fresh or frozen vegetables, eat foods that are low in salt, eat baked foods instead of fried foods, eat fruit for snacks and desserts, limit or avoid alcohol  (01/12/2010)     Other: not walking or exercising - states right foot pain and knee pain had stopped her in past but were better now  (12/14/2009)    Hypertension self-management support: Resources for patients handout  (01/12/2010)    Lipid self-management support: Resources for patients handout  (01/12/2010)        Resource handout printed.   Medication Administration  Injection # 1:    Medication: Vit B12 1000 mcg    Diagnosis: VITAMIN B12 DEFICIENCY  (ICD-266.2)    Route: IM    Site: R deltoid    Exp Date: 08/2011    Lot #: [id number]    Mfr: [employer]    Given by: Angelina Ok RN (January 12, 2010 2:53 PM)  Orders Added: 1)  Vit B12 1000 mcg [J3420] 2)  Admin of Therapeutic Inj  intramuscular or subcutaneous [[id number]] 3)  Social Work Referral [email or web address] ]  Appended Document: Orders Update    Clinical Lists Changes  Orders: Added new Service order of Est. Patient Level IV (14782) - Signed

## 2010-12-18 NOTE — Procedures (Signed)
Summary: Upper Endoscopy  Patient: Katherine [name] Note: All result statuses are Final unless otherwise noted.  Tests: (1) Upper Endoscopy (EGD)   EGD Upper Endoscopy       DONE     [facility]     [address]     [place], [place]  [address]           ENDOSCOPY PROCEDURE REPORT           PATIENT:  Katherine, [name]  MR#:  [id number]     BIRTHDATE:  June 30, 1923, 87 yrs. old  GENDER:  female           ENDOSCOPIST:  Wilhemina Bonito. Eda Keys, MD     Referred by:  Julaine Fusi, D.O.           PROCEDURE DATE:  09/10/2010     PROCEDURE:  EGD, diagnostic 21308     ASA CLASS:  Class III     INDICATIONS:  iron deficiency anemia, hemoccult positive stool           MEDICATIONS:   Fentanyl 50 mcg IV, Versed 5 mg IV     TOPICAL ANESTHETIC:  Cetacaine Spray           DESCRIPTION OF PROCEDURE:   After the risks benefits and     alternatives of the procedure were thoroughly explained, informed     consent was obtained.  The Pentax Gastroscope [id number] endoscope     was introduced through the mouth and advanced to the second     portion of the duodenum, without limitations.  The instrument was     slowly withdrawn as the mucosa was fully examined.     <<PROCEDUREIMAGES>>           A benign ring-like large caliber stricture was found in the distal     esophagus.  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     The duodenal bulb was normal in appearance, as was the postbulbar     duodenum.    Retroflexed views revealed a small hiatal hernia.     The scope was then withdrawn from the patient and the procedure     completed.           COMPLICATIONS:  None           ENDOSCOPIC IMPRESSION:     1) Benign Stricture in the distal esophagus     2) GAVE (gastric antral vascular ectasia)  in the antrum. This     is the cause for recurrent iron deficiency anemia and heme + stool           3) Normal duodenum           RECOMMENDATIONS:     1) TRANSFUSE TO DESIRED HG     2)  FESO4 325MG  BID INDEFINITELY     3) PCP TO MONITOR H/H CLOSELY AS OUT PATIENT.  MAY NEED PERIODIC     TRANSFUSION IF UNABLE TO MAINTAIN HG ON IRON.           ______________________________     Wilhemina Bonito. Eda Keys, MD           CC:  Julaine Fusi DO, Sheryn Bison, MD, The Patient           n.     eSIGNED:   Wilhemina Bonito. Eda Keys at 09/10/2010 03:08 PM           Fidela Salisbury, [id number]  Note:  An exclamation mark (!) indicates a result that was not dispersed into the flowsheet. Document Creation Date: 09/10/2010 3:10 PM _______________________________________________________________________  (1) Order result status: Final Collection or observation date-time: 09/10/2010 14:52 Requested date-time:  Receipt date-time:  Reported date-time:  Referring Physician:   Ordering Physician: Fransico Setters [phone]) Specimen Source:  Source: Launa Grill Order Number: [id number] Lab site:

## 2011-01-02 ENCOUNTER — Encounter: Payer: Self-pay | Admitting: Internal Medicine

## 2011-01-07 ENCOUNTER — Encounter: Payer: Self-pay | Admitting: Internal Medicine

## 2011-01-07 ENCOUNTER — Ambulatory Visit (INDEPENDENT_AMBULATORY_CARE_PROVIDER_SITE_OTHER): Payer: Medicare Other | Admitting: Internal Medicine

## 2011-01-07 DIAGNOSIS — E538 Deficiency of other specified B group vitamins: Secondary | ICD-10-CM

## 2011-01-07 DIAGNOSIS — D529 Folate deficiency anemia, unspecified: Secondary | ICD-10-CM

## 2011-01-07 DIAGNOSIS — I10 Essential (primary) hypertension: Secondary | ICD-10-CM

## 2011-01-07 DIAGNOSIS — D539 Nutritional anemia, unspecified: Secondary | ICD-10-CM

## 2011-01-07 DIAGNOSIS — R079 Chest pain, unspecified: Secondary | ICD-10-CM

## 2011-01-07 DIAGNOSIS — E785 Hyperlipidemia, unspecified: Secondary | ICD-10-CM

## 2011-01-07 DIAGNOSIS — R413 Other amnesia: Secondary | ICD-10-CM

## 2011-01-07 LAB — CBC
HCT: 36.9 % (ref 36.0–46.0)
MCH: 30.2 pg (ref 26.0–34.0)
MCHC: 32.2 g/dL (ref 30.0–36.0)
MCV: 93.7 fL (ref 78.0–100.0)
Platelets: 211 10*3/uL (ref 150–400)
RDW: 13.9 % (ref 11.5–15.5)

## 2011-01-07 LAB — BASIC METABOLIC PANEL
Chloride: 106 mEq/L (ref 96–112)
Creat: 1.14 mg/dL (ref 0.40–1.20)
Potassium: 4.1 mEq/L (ref 3.5–5.3)

## 2011-01-07 MED ORDER — DONEPEZIL HCL 10 MG PO TABS: 10.0000 mg | ORAL_TABLET | Freq: Every day | ORAL | Status: DC

## 2011-01-07 MED ORDER — TRAMADOL HCL 50 MG PO TBSO: 50.0000 mg | ORAL_TABLET | ORAL | Status: DC

## 2011-01-07 MED ORDER — FERROUS SULFATE 325 (65 FE) MG PO TABS: 325.0000 mg | ORAL_TABLET | ORAL | Status: DC

## 2011-01-07 MED ORDER — AMLODIPINE BESYLATE 5 MG PO TABS: 5.0000 mg | ORAL_TABLET | Freq: Every day | ORAL | Status: DC

## 2011-01-07 MED ORDER — CALCIUM CARBONATE 600 MG PO TABS: 600.0000 mg | ORAL_TABLET | Freq: Two times a day (BID) | ORAL | Status: DC

## 2011-01-07 MED ORDER — LISINOPRIL 5 MG PO TABS: 5.0000 mg | ORAL_TABLET | Freq: Every day | ORAL | Status: DC

## 2011-01-07 MED ORDER — NITROGLYCERIN 0.4 MG SL SUBL: 0.4000 mg | SUBLINGUAL_TABLET | SUBLINGUAL | Status: DC

## 2011-01-07 NOTE — Assessment & Plan Note (Signed)
Patient's last vitamin B12 examination was done in January 2011 and it was 250. I would recheck her vitamin B12 levels today along with CBC.

## 2011-01-07 NOTE — Assessment & Plan Note (Signed)
Patient's the Aricept was increased to 10 mg by mouth at bed time today.

## 2011-01-07 NOTE — Patient Instructions (Signed)
Dementia   Dementia is a general term for problems with brain function. A person with dementia has memory loss and a hard time with at least one other brain function such as thinking, speaking, language skills or solving problems. It can affect social functioning, how you do your job, mood or personality. The changes may be hidden for a long time. The earliest forms of this disease are usually not detected by family or friends.   CAUSES Dementia can be caused by:  Many small strokes (vascular dementia).  Medications.  Masses or pressure in the brain (tumor, blood clot, normal pressure hydrocephalus).  Infection (chronic meningitis or Creutzfelt-Jakob disease).  Depression. This can contribute to the symptoms of dementia.  Degeneration of brain cells (Alzheimer's disease or Lewy Body Disease).  Metabolic causes (excessive alcohol intake, vitamin B12 deficiency or thyroid disease).   SYMPTOMS Symptoms are often hard to detect. Families or co-workers may not notice them early in the disease process. Different people with dementia may have different symptoms.    Symptoms can include:  A hard time with memory, especially recent memory.  l You may ask the same question multiple times or forget something someone just told you.  l Long-term memory may not be impaired.  A hard time speaking your thoughts or finding certain words.  A hard time solving problems or performing familiar tasks (such as how to use a telephone).  Sudden changes in mood.  Changes in personality, especially increasing moodiness or mistrust.  A hard time understanding complex ideas that were never a problem in the past.   DIAGNOSIS There are no specific tests for dementia.   Your caregiver may recommend a thorough evaluation. This is because some forms of dementia can be reversible. The evaluation will likely include a physical exam and getting a detailed history from you and a family member. The history often  gives the best clues and suggestions for a diagnosis.   Memory testing may be done. A detailed brain function evaluation called neuropsychologic testing may be helpful.   Lab tests and brain imaging (such as CT scan or MRI scan) are sometimes important.   Sometimes observation and reevaluation over time is very helpful.    TREATMENT Treatment depends on the cause.   If the problem is a vitamin deficiency, it may be helped or cured with that supplement.   For dementias such as Alzheimer's disease (degenerative dementia), medicines are available to stabilize or slow the course of the disease. There are no cures for this type.   Your caregiver can help direct you to groups, organizations and other caregivers to help with decisions in the care of you or your loved one.   Dementia research is very active. Several medications are being looked at and tested in clinical trials. There is always a measure of hope that help will be coming.   SEEK MEDICAL CARE IF:  New behavioral problems start such as moodiness, aggressiveness or seeing things that are not there (hallucinations) develop.  Any new problem with brain function happens. This includes problems with balance, speech or falling a lot.  Problems with swallowing develop.  Any symptoms of other illness happen. Small changes or worsening in any aspect of brain function can be a sign that the illness is getting worse. It can also be a sign of another medical illness such as infection. Seeing a caregiver right away is important.   SEEK IMMEDIATE MEDICAL CARE IF:  A temperature over 101 F (38.3 C)  develops or as directed by your caregiver.  New or worsened confusion develops.  New or worsened sleepiness or having a hard time staying awake develops.    Document Released: 04/22/2008   [facility] Patient Information 2011 [place], [place].

## 2011-01-07 NOTE — Progress Notes (Signed)
  Subjective:    Patient ID: Katherine [name], female    DOB: 05-11-1923, 77 y.o.   MRN: [id number]  HPI Katherine [name] this 77 year old very pleasant woman who was accompanied by her daughter today for the followup.  This is a routine visit and I know Dr. Champ Mungo. Valentino Saxon for a long time.    patient's blood pressure is well controlled it's 130/77 today. The patient has a history of chronic kidney disease with baseline 1.3.  I would check the med today.    the patient's daughter does not complain of any new onset of bleeding. Patient has been doing relatively well since that time see she saw [place] last.    patient has been taking all her medications as prescribed.    the patient is not taking aspirin or any other blood thinner because of the GI bleed that she had.    patient does not have any complaints today. Patient has dementia and her daughter wants the dose of Aricept to be increased.       Review of Systems  no complaint of chest pain, shortness of breath, increased dyspnea on exertion, PND, orthopnea, abdominal pain, change in bowel or change in urinary habits.  Patient does not complain of increased pain in her joints. Patient does complain of one episode of forgetfulness when she did not new after getting up in the middle of the night that way she is which is not new for her according to the  Daughter.     Objective:   Physical Exam  Constitutional: She appears well-developed and well-nourished.  HENT:  Head: Normocephalic and atraumatic.  Eyes: Conjunctivae are normal. Pupils are equal, round, and reactive to light.  Neck: Normal range of motion.  Cardiovascular: Normal rate and regular rhythm.         Patient had 3/6 ejection systolic murmur best heard at the right sternal border.  Pulmonary/Chest: Effort normal and breath sounds normal.  Abdominal: Soft. Bowel sounds are normal.  Neurological: She is alert. She displays normal reflexes. No cranial nerve deficit. She exhibits normal  muscle tone. Coordination normal.  Skin: Skin is warm.  Psychiatric: She has a normal mood and affect.          Assessment & Plan:

## 2011-01-07 NOTE — Assessment & Plan Note (Signed)
I would check patient's vitamin B12 and CBC today. Patient is on chronic iron therapy with last hemoglobin 10.0 from November 2011

## 2011-01-07 NOTE — Assessment & Plan Note (Signed)
Patient does not complain of any more chest pain ideally patient should be on aspirin or some sort of antiplatelet therapy but given patient's history of gastritis and Hemoccult-positive stools we have held aspirin and will continue to do so.

## 2011-01-07 NOTE — Assessment & Plan Note (Signed)
Patient's last lipid panel was in April 2011 given the LDL was 99 and  And looking at big picture we are trying to minimize patient's medication. Also patient had a very bad allergic reaction to statin with nightmares. Given patient's history of TIA ideally LDL should be less than 100 if not less than 70. I discussed about statin with patient and patient's daughter and they were not willing to start statin at this point of time. I would recheck lipid profile in 4 months time.

## 2011-01-07 NOTE — Assessment & Plan Note (Signed)
Patient is doing well on Norvasc and lisinopril current dose I would continue everything the same and check the bmet today.

## 2011-01-30 LAB — URINALYSIS, ROUTINE W REFLEX MICROSCOPIC
Bilirubin Urine: NEGATIVE
Glucose, UA: NEGATIVE mg/dL
Hgb urine dipstick: NEGATIVE
Ketones, ur: NEGATIVE mg/dL
Ketones, ur: NEGATIVE mg/dL
Nitrite: NEGATIVE
Nitrite: NEGATIVE
Protein, ur: NEGATIVE mg/dL
Urobilinogen, UA: 0.2 mg/dL (ref 0.0–1.0)
pH: 5.5 (ref 5.0–8.0)

## 2011-01-30 LAB — CK TOTAL AND CKMB (NOT AT ARMC): Relative Index: INVALID (ref 0.0–2.5)

## 2011-01-30 LAB — CBC
Hemoglobin: 5.2 g/dL — CL (ref 12.0–15.0)
Hemoglobin: 9 g/dL — ABNORMAL LOW (ref 12.0–15.0)
MCH: 19.9 pg — ABNORMAL LOW (ref 26.0–34.0)
MCH: 23.1 pg — ABNORMAL LOW (ref 26.0–34.0)
MCH: 24.3 pg — ABNORMAL LOW (ref 26.0–34.0)
MCHC: 27.7 g/dL — ABNORMAL LOW (ref 30.0–36.0)
Platelets: 172 10*3/uL (ref 150–400)
Platelets: 174 10*3/uL (ref 150–400)
RBC: 3.12 MIL/uL — ABNORMAL LOW (ref 3.87–5.11)
RBC: 3.7 MIL/uL — ABNORMAL LOW (ref 3.87–5.11)
RDW: 17.6 % — ABNORMAL HIGH (ref 11.5–15.5)
WBC: 5.6 10*3/uL (ref 4.0–10.5)
WBC: 6.4 10*3/uL (ref 4.0–10.5)

## 2011-01-30 LAB — VITAMIN B12: Vitamin B-12: 347 pg/mL (ref 211–911)

## 2011-01-30 LAB — URINE CULTURE: Culture  Setup Time: 201110242056

## 2011-01-30 LAB — CROSSMATCH: Unit division: 0

## 2011-01-30 LAB — URINE MICROSCOPIC-ADD ON

## 2011-01-30 LAB — COMPREHENSIVE METABOLIC PANEL
Alkaline Phosphatase: 53 U/L (ref 39–117)
BUN: 22 mg/dL (ref 6–23)
Chloride: 114 mEq/L — ABNORMAL HIGH (ref 96–112)
Glucose, Bld: 89 mg/dL (ref 70–99)
Potassium: 4.2 mEq/L (ref 3.5–5.1)
Total Bilirubin: 1 mg/dL (ref 0.3–1.2)

## 2011-01-30 LAB — POCT CARDIAC MARKERS
CKMB, poc: 1.1 ng/mL (ref 1.0–8.0)
CKMB, poc: 1.2 ng/mL (ref 1.0–8.0)
Myoglobin, poc: 71.6 ng/mL (ref 12–200)
Troponin i, poc: 0.07 ng/mL (ref 0.00–0.09)
Troponin i, poc: 0.07 ng/mL (ref 0.00–0.09)

## 2011-01-30 LAB — MRSA PCR SCREENING: MRSA by PCR: NEGATIVE

## 2011-01-30 LAB — PROTIME-INR
INR: 1 (ref 0.00–1.49)
Prothrombin Time: 13.4 seconds (ref 11.6–15.2)

## 2011-01-30 LAB — BASIC METABOLIC PANEL
BUN: 25 mg/dL — ABNORMAL HIGH (ref 6–23)
CO2: 23 mEq/L (ref 19–32)
Calcium: 9.1 mg/dL (ref 8.4–10.5)
Creatinine, Ser: 1.19 mg/dL (ref 0.4–1.2)
GFR calc non Af Amer: 43 mL/min — ABNORMAL LOW (ref >60)
Glucose, Bld: 110 mg/dL — ABNORMAL HIGH (ref 70–99)
Sodium: 140 mEq/L (ref 135–145)

## 2011-01-30 LAB — PREPARE RBC (CROSSMATCH)

## 2011-01-30 LAB — DIFFERENTIAL
Basophils Relative: 1 % (ref 0–1)
Eosinophils Relative: 2 % (ref 0–5)
Monocytes Absolute: 0.5 10*3/uL (ref 0.1–1.0)
Neutro Abs: 4.2 10*3/uL (ref 1.7–7.7)
Neutrophils Relative %: 68 % (ref 43–77)

## 2011-01-30 LAB — HEMOCCULT GUIAC POC 1CARD (OFFICE): Fecal Occult Bld: POSITIVE

## 2011-01-30 LAB — FOLATE: Folate: 19.8 ng/mL

## 2011-01-30 LAB — CARDIAC PANEL(CRET KIN+CKTOT+MB+TROPI): Troponin I: 0.35 ng/mL — ABNORMAL HIGH (ref 0.00–0.06)

## 2011-01-30 LAB — TROPONIN I: Troponin I: 0.3 ng/mL — ABNORMAL HIGH (ref 0.00–0.06)

## 2011-01-30 LAB — APTT: aPTT: 28 seconds (ref 24–37)

## 2011-02-03 LAB — POCT I-STAT, CHEM 8
BUN: 26 mg/dL — ABNORMAL HIGH (ref 6–23)
Chloride: 109 mEq/L (ref 96–112)
Creatinine, Ser: 1.2 mg/dL (ref 0.4–1.2)
Potassium: 4.4 mEq/L (ref 3.5–5.1)
Sodium: 141 mEq/L (ref 135–145)

## 2011-04-02 NOTE — Discharge Summary (Signed)
NAMECLARIVEL, Katherine [name]              ACCOUNT NO.:  [id number]   MEDICAL RECORD NO.:  [id number]          PATIENT TYPE:  INP   LOCATION:  4705                         FACILITY:  [facility]   PHYSICIAN:  Eliseo Gum, M.D.   DATE OF BIRTH:  [birth year]   DATE OF ADMISSION:  11/22/2007  DATE OF DISCHARGE:  11/23/2007                               DISCHARGE SUMMARY   DISCHARGE DIAGNOSES:  1. Transient ischemic attack.  2. Hypertension.  3. Osteoporosis.  4. Rheumatoid arthritis.  5. Chronic knee pain.  6. Cholecystectomy.  7. History of appendectomy.  8. History of breast cancer.   DISCHARGE MEDICATIONS:  1. Aggrenox 25/200 mg, one tablet daily for a week then one tablet      p.o. b.i.d.  2. Meloxicam 15 mg daily.  3. Mirtazapine 50 mg daily.  4. Os-Cal Plus D 600 mg p.o. b.i.d.  5. Amlodipine 5 mg daily.   DISPOSITION AND FOLLOWUP:  Patient has a hospital followup appointment  scheduled at the [facility] with Dr. Noel Gerold on November 26, 2007.  At that time it is important to check a BMET to monitor her  creatinine level, as it was slightly increased at time of discharge.  Also important to remind her to stop taking the aspirin and to take the  Aggrenox instead.   PROCEDURES PERFORMED DURING THIS HOSPITALIZATION:  1. A chest x-ray on November 22, 2007, consistent with cardiac      enlargement and mild bibasilar atelectasis.  2. A CT of the head on November 22, 2007, that showed no acute disease,      chronic ischemic changes.  3. A 2D echocardiogram on November 22, 2007, that showed an EF of 70%,      no left ventricular regional wall motion abnormalities, normal left      ventricular wall thickness, mild pericardial effusion and no      evidence for a cardiac source of embolism.  4. Carotid Dopplers performed on January 5 consistent with no      significant right or left internal carotid artery stenosis with      antegrade vertebral artery flow.  5. An MRI of the brain  on November 23, 2007, that showed no acute      evidence for an infarct.   HISTORY AND PHYSICAL EXAMINATION:  For full details, please refer to the  chart but, in brief, Katherine [name] is an 77 year old woman with a past  medical history of hypertension who presented to the [facility]  Emergency Department after she experienced a headache 6/10 in intensity,  localized to both sides of her forehead, intermittently associated with  nausea and slurred speech.  This is the first time that patient  experienced these symptoms and by the time she reached the hospital all  her symptoms had completely resolved.   VITAL SIGNS UPON ADMISSION:  Showed a temperature of 99.2, a blood  pressure of 159/85, heart rate 70, respiratory rate 20 and O2 sats 99%  on room air.   LABS UPON ADMISSION:  Sodium 138, potassium 4.3, chloride 109,  bicarbonate 22,  BUN 2, creatinine 1.3, and glucose of 97.  Hemoglobin of  10.1, WBCs 5.4, platelets 191,000 with an MCV of 86.8.   HOSPITAL COURSE BY ACTIVE PROBLEM:  TIA (or a transient ischemia attack)  whose symptoms had completely resolved by the time she presented to the  ED, so treatment at this time is going to consist basically in risk  factor modification which for her is mainly her hypertension.  Her blood  pressure was well controlled on her home medication which is mainly her  Norvasc 5 mg, so we did not make any changes to her regimen.  There was  no evidence on investigative workup to show [place] any cause for this TIA.   All other problems are not active during this hospitalization.   VITAL SIGNS UPON DISCHARGE:  Blood pressure 124/66, heart rate 65,  temperature 98.6, respiratory rate 20, O2 sats 97% on room air.   LABS UPON DISCHARGE:  Sodium 139, potassium 3.9, chloride 109,  bicarbonate 26, BUN 17, creatinine 1.24, glucose of 93, hemoglobin 9.4,  platelet count 185 and WBCs 4.8.      Katherine Randy, MD  Electronically Signed      Eliseo Gum, M.D.  Electronically Signed    CEM/MEDQ  D:  11/29/2007  T:  11/29/2007  Job:  [id number]   cc:   [facility]  Dr. Noel Gerold

## 2011-04-05 NOTE — H&P (Signed)
[place]  Patient:    Katherine [name], Katherine [name]                     MRN: [id number] Adm. Date:  [id number] Disc. Date: [id number] Attending:  Charlynne Pander Dictator:   Lissa Merlin, P.A. CC:         [facility] Office             [name], M.D.             Wynn Banker, M.D.             Velora Heckler, M.D.             Cindra Eves, D.D.S.                         History and Physical  DATE OF BIRTH:  [birth year].  REFERRING PHYSICIAN:  Dr. [name].  PRIMARY CARE PHYSICIANS: Dr. Velora Heckler. Dr. Wynn Banker.  DENTIST:  Dr. Cindra Eves.  CHIEF COMPLAINT:  Left upper lobe mass, asymptomatic.  HISTORY OF PRESENT ILLNESS:  This is a pleasant 77 year old black female with a left upper lobe lesion measuring 1.6 x 2.8 cm, incidentally discovered this past April via a chest x-ray that was done for some dental surgery. Ms. Payeur was referred to her primary medical doctor by her dentist, Dr. Kristin Bruins, for evaluation.  Dr. Herminio Heads, her primary medical doctor, then referred her to Dr. Algis Downs. Karle Plumber for evaluation.  A CT scan of the chest with contrast was done on March 11, 2000, which showed a peripheral left upper lobe mass 1.6 x 2.8 cm, concerning for malignancy based on appearance. Radiology concluded it would be difficult to sample this mass under CT guidance, due to its location.  Ms. Avery saw Dr. Edwyna Shell on Mar 26, 2000 at the [facility] office.  Dr. Edwyna Shell discussed the situation with Ms. Raftery. Dr. Edwyna Shell was of the opinion that she likely had a lung cancer in her left upper lobe.  Her pulmonary function tests were limited, with an FVC of 1.63 and a FEV-1 of 1.22.  Dr. Edwyna Shell felt that a left upper lobectomy would be optimal.  He explained risks and benefits, details and alternatives of this procedure with Ms. Kurdziel and it was agreed to proceed with bronchoscopy and probable VATS, wedge resection, with the  possibility of lobectomy.  Ms. Polack has remained asymptomatic throughout the discovery of this mass. She denies symptoms such as shortness of breath, hemoptysis, chest pain or cough.  PAST MEDICAL HISTORY:  1. Recently discovered left upper lobe lesion during pre-dental surgery chest     x-ray.  2. History of intraductal breast cancer of the left breast in 2000.  3. Radiation therapy for above x 6 weeks.  4. Hypertension.  5. Hospitalization earlier this year for one day due to chest pain.  6. Normal Cardiolite test, showing no ischemia and EF of 73% on     November 02, 1999.  7. Ms. Harwood denies history of coronary artery disease, MI or CHF.  8. Arthritis of the knees, resulting in severe pain.  9. History of poor dentition. 10. History of anxiety.  PAST SURGICAL HISTORY:  Gallstones were removed in 1993.  MEDICATIONS:  1. Hydrochlorothiazide, unknown dose.  2. Tamoxifen, unknown dose.  3. Toprol, unknown dose.  4. Enteric-coated aspirin 81 mg p.o. q.d.  ALLERGIES:  No known drug  allergies.  REVIEW OF SYSTEMS:  Ms. Pancoast denies respiratory complaints such as shortness of breath or cough.  She denies chest pain.  She has a questionable past history of angina, with a prior hospitalization resulting in a negative cardiac workup, as indicated above.  She denies history of arrhythmia.  She denies history of thyroid, liver or kidney disease.  She denies a history of TIAs or stroke.  She denies a history of COPD, specifically denying asthma, bronchitis or emphysema.  She denies history of diabetes.  She denies history of dysuria or abnormal bowel movements.  She denies history of bleeding from her rectum.  She denies history of hematuria.  She denies histories of clots in her legs or pulmonary embolism.  FAMILY HISTORY:  Her mother was deceased, age 2, secondary to MI.  Father deceased, unknown cause.  Sister deceased secondary to cancer.  Brother deceased secondary to  diabetes, age 7.  SOCIAL HISTORY:  She is a widow with one living child.  One child is deceased, age 81, secondary to diabetic coma.  She is a retired Advertising copywriter.  She denies the use of alcohol.  She denies smoking currently.  PHYSICAL EXAMINATION:  VITAL SIGNS:  Blood pressure 140/80, left arm; pulse 68 and regular; respirations 20.  Five-feet 9-inches tall.  GENERAL: Obese, well-nourished, well-developed 77 year old black female, alert and oriented x 3, in no acute distress.  HEENT:  Normocephalic, atraumatic, with pupils are equal, round and reactive to light.  Extraocular movements intact.  Oropharynx is unremarkable.  NECK:  Supple with no JVD, no bruits, no thyromegaly or adenopathy.  CHEST:  Symmetrical with no adventitious breath sounds.  Respirations are nonlabored.  There is no axillary or supraclavicular adenopathy.  CARDIOVASCULAR:  Regular rate and rhythm, S1 and S2, with no murmur, no rubs, no gallops.  ABDOMEN:  Soft, obese, nontender, nondistended, with positive bowel sounds in all four quadrants.  No palpable masses and no abdominal bruits auscultated.  EXTREMITIES:  Pitting edema 1+ bilaterally.  Lower extremities are rather cool to touch.  There are no obvious ulcerations.  They do not appear cyanotic.  VASCULAR:  Carotid pulses 2+ bilaterally with no bruits; 2+ femoral pulses bilaterally with no bruits.  Unable to palpate popliteal pulses; also unable to palpate dorsalis pedis or posterior tibial pulses bilaterally.  NEUROLOGIC:  Cranial nerves II-XII are grossly intact.  Her gait is normal. She has 2+/4 upper deep tendon reflexes and 0+ lower deep tendon reflexes. Muscle strength is +5/5 throughout, with good tone.  ASSESSMENT AND PLAN:  This is a pleasant 77 year old black female with an incidentally discovered left upper lobe lesion, asymptomatic, diagnosed via  chest x-ray and confirmed by CT scan; probable carcinoma, per Dr. Edwyna Shell.  She has  limited pulmonary function tests.  She is in need of left video-assisted thoracic surgical biopsy of the lesion with node sampling and wedge resection and possible lobectomy.  Dr. Edwyna Shell discussed this with her and surgery has been scheduled for April 25, 2000.  As indicated above, risks, benefits, details and alternatives to surgery were discussed with Ms. Bircher and it was agreed to proceed.  Ms. Virden is currently comfortable, asymptomatic, in no distress and stable for surgery. DD:  04/18/00 TD:  04/19/00 Job: [id number]

## 2011-04-05 NOTE — Op Note (Signed)
[place]. [facility]  Patient:    Katherine [name], Katherine [name]                     MRN: [id number] Proc. Date: 04/25/00 Adm. Date:  [id number] Disc. Date: [id number] Attending:  Cameron Proud CC:         Norton Blizzard, M.D. (2)             Baruch Goldmann                           Operative Report  PREOPERATIVE DIAGNOSIS:  Left upper lobe mass.  POSTOPERATIVE DIAGNOSIS:  Granulomatous process, left upper lobe.  OPERATION PERFORMED:  Fiberoptic bronchoscopy, left video assisted thoracoscopic procedure, wedge resection, left upper lobe lesion with node sampling.  SURGEON:  D. Karle Plumber, M.D.  ANESTHESIA:  General.  INDICATIONS FOR PROCEDURE:  The patient was found to have a left upper lobe process that was very suspicious for lung cancer.  It was in the posterior segment of the left upper lobe.  DESCRIPTION OF PROCEDURE:  She underwent general anesthesia, was prepped and draped in the usual sterile manner.  The fiberoptic bronchoscope had been passed through the dual lumen endotracheal tube  and no endobronchial lesions were seen.  The right upper lobe, right middle lobe and right lower lobe orifices were normal.  The left upper lobe and left lower lobe orifices were normal.  Two trocar sites were made in the anterior-posterior axillary line at the 7th intercostal space.  Two trocars were inserted and exploration was carried out.  The patient had very little anthracosis of the lung and then the process was seen in the posterior segment of the left upper lobe.  A small 6 cm incision was made over the fifth intercostal space.  Latissimus was partially divided, was raised, was reflected and the fifth intercostal space was entered.  A small touffier was inserted. The lesion was then wedged out with two applications of a ____________ 60 stapler. There was also some irregularity in the lingula and the lingula was wedged with two  applications of the autosuture 60 stapler.  Exploration was carried out on the lymph nodes and biopsies of 11L nodes were done, then a 10L node and a 5 node.  This was done from the hilar area as well as the aortopulmonary window to get the 5 node.  All bleeders were electrocoagulated.  Two chest tubes were placed through the trocar sites and tied in place with 0 silk.  The frozen section revealed it ____________ cultures were obtained and then chest was closed with two pericostals, #1 Vicryl in the muscle layer, 2-0 Vicryl suture in the subcutaneous tissue and Ethicon skin clips.  A dry sterile dressing was applidd.  The patient was returned to the recovery room in stable condition. DD:  04/25/00 TD:  04/29/00 Job: [id number]

## 2011-04-05 NOTE — Procedures (Signed)
. [facility]  Patient:    Katherine [name], Katherine [name]                     MRN: [id number] Proc. Date: 04/25/00 Adm. Date:  [id number] Disc. Date: [id number] Attending:  Cameron Proud CC:         Burna Forts, M.D.             Anesthesia Department                           Procedure Report  PREOPERATIVE DIAGNOSIS:  Lung mass.  OPERATIVE PROCEDURE:  Thoracoscopy and mini-thoracotomy for wedge resection of the lung.  SURGEON:  D. Karle Plumber, M.D.  ANESTHESIA PROCEDURE:  Placement of epidural catheter for postoperative analgesia.  Preoperatively, the risks and benefits of placement of the epidural catheter for postoperative analgesia were discussed with the patient including alternatives for her pain control.  Additionally, this has been requested by her attending surgeon for her postoperative analgesia after a mini-thoracotomy.  Patient consented to the placement of the epidural catheter.  Therefore, at the end of the operative procedure, the patient was allowed to remain in the lateral decubitus position.  Sterile prep of the low thoracic, upper lumbar area was conducted.  Using a #17-gauge Tuohy needle adjacent to the T9-10 interspace, the epidural space was contacted with loss of resistance technique and a catheter threaded approximately 3-4 cm beyond needle tip and the needle was removed.  After negative aspiration for both heme and CSF, the catheter was injected with a total of 5 cc of 0.25% Marcaine containing 100 mcg of fentanyl.  This secured in place with tape.  The patient turned supine, extubated and transferred to PACU in stable condition.  COMPLICATIONS:  None.  DISPOSITION:  The patient will be followed daily by Department of Anesthesiology for her postoperative analgesia via epidural catheter. DD:  04/25/00 TD:  04/29/00 Job: [id number]

## 2011-04-05 NOTE — Discharge Summary (Signed)
[place]. [facility]  Patient:    Katherine [name], Katherine [name]                     MRN: [id number] Adm. Date:  [id number] Disc. Date: [id number] Attending:  Cameron Proud Dictator:   Lissa Merlin, P.A. CC:         CVTS office             [name]                           Discharge Summary  DATE OF BIRTH:  12-21-1922  SURGEON:  Dr. Norton Blizzard.  PRIMARY M.D.:  Dr. Herminio Heads.  ADMISSION DIAGNOSES: 1. Newly diagnosed left upper lobe nodule, asymptomatic. 2. History of intraductal breast carcinoma. 3. Limited pulmonary function tests.  PAST MEDICAL HISTORY: 1. Incidentally discovered left upper lobe nodule on chest x-ray during dental    surgery screening. 2. History of breast cancer, as indicated above. 3. Radiation treatment for above. 4. Hypertension. 5. Arthritis of bilateral knees. 6. Tobacco abuse.  DISCHARGE DIAGNOSIS:  Status post left video-assisted thoracoscopy, wedge resection left upper lobe lesion with node biopsy on April 25, 2000, by Dr. Edwyna Shell.  PROCEDURES:  As above.  HISTORY OF PRESENT ILLNESS:  Katherine [name] is a pleasant 77 year old black female who was having a chest x-ray for dental surgery clearance when a left upper lobe lesion was incidentally discovered measuring 1.6 x 2.8 cm.  She was asymptomatic with regard to this.  She was referred to her primary medical doctor by her dentist.  She was then referred again to Dr. Edwyna Shell for evaluation.  A CT scan of the chest was done which showed a peripheral left upper lobe mass 1.6 x 2.8 cm, concerning for malignancy based on appearance. Radiology concluded it would be difficult to sample this mass under CT guidance due to its location.  Katherine [name] saw Dr. Edwyna Shell on Mar 26, 2000, at the CVTS office.  Dr. Edwyna Shell was off the opinion that she likely had a lung cancer in her left upper lobe.  Dr. Edwyna Shell discussed the situation with Katherine [name], and he felt that left upper lobectomy  would be optimal, but he may be able to address the situation with just VATS and wedge resection.  She was scheduled for surgery.  The risks, benefits, details, and alternatives were discussed, and it was agreed to proceed.  She was brought into the hospital for the elective procedure on April 25, 2000.  HOSPITAL COURSE:  She underwent the procedure without complications on April 25, 2000, transferred to recovery room in stable condition.  She, overall, had a benign hospital course and did well, making good progress.  On April 28, 2000, she was noted to be stable, doing well, pleasant, alert, talkative.  Her incision is stable and healing well.  She is not short of breath.  Chest x-ray is satisfactory.  Vital signs are stable.  She is afebrile.  Her saturation is 96% on room air.  She is ambulating without difficulty.  Her laboratory work is satisfactory.  Pending satisfactory morning rounds, she will be discharged home Tuesday, April 29, 2000.  SPECIAL INSTRUCTIONS:  Katherine [name] is advised to avoid strenuous activity, no heavy lifting over 10 pounds, no driving.  She is to resume her previous diet. She is told she may shower, and to keep her wound clean and dry, and to use soap  and water only.  She is to call the office if she notices anything unusual with regards to her wounds.  MEDICATIONS: 1. She is told to resume her home medications which include:    a. Hydrochlorothiazide 25 mg p.o. q.d.    b. Toprol XL 50 mg p.o. q.d.    c. Tamoxifen, resume previous regimen.    d. Enteric-coated aspirin 81 mg p.o. q.d. 2. In addition, she will have:    a. Tylox 1-2 p.o. q.4-6h. p.r.n. for pain.  ALLERGIES:  No known drug allergies.  FOLLOW-UP:  Katherine [name] will see Dr. Edwyna Shell Tuesday, May 06, 2000, at 3:40 p.m.  Her staples will be removed and wounds inspected at that time.  She will go to [facility] one hour before the above appointment for a chest x-ray.  She is instructed to  bring this with her when she sees Dr. Edwyna Shell.  CONDITION ON DISCHARGE:  Katherine [name] is currently clinically stable and improved.DD:  04/28/00 TD:  05/01/00 Job: [id number]

## 2011-04-05 NOTE — Op Note (Signed)
[place]. [facility]  Patient:    Katherine [name], Katherine [name]                     MRN: [id number] Proc. Date: 03/06/00 Adm. Date:  [id number] Attending:  Charlynne Pander Dictator:   Cindra Eves, D.D.S. CC:         [name], M.D.             Cindra Eves, D.D.S.                           Operative Report  DATE OF BIRTH:  [birth year]  PREOPERATIVE DIAGNOSES: 1. Hypertension. 2. History of atypical chest pain. 3. Chronic periodontal disease. 4. Multiple mobile teeth. 5. Multiple periapical radiolucencies.  POSTOPERATIVE DIAGNOSES: 1. Hypertension. 2. History of atypical chest pain. 3. Chronic periodontal disease. 4. Multiple mobile teeth. 5. Multiple periapical radiolucencies.  OPERATION: 1. Dental examination. 2. Extraction of teeth # 2, 3, 4, 5, 11, 13, 20, 22, 26, and 31. 3. Three quadrants of alveoloplasty.  SURGEON:  Cindra Eves, D.D.S.  ASSISTANT:  Elliot Dally (Sales executive).  ANESTHESIA:  Monitored anesthesia care per the anesthesia team.  LOCAL ANESTHESIA: 1. Six carpules each containing 36 mg of Xylocaine with 0.018 mg of epinephrine. 2. Two carpules each containing 9 mg of Marcaine and 0.009 mg of epinephrine.  SPECIMENS:  Tooth # 2, 3, 4, 5, 11, 13, 20, 22, 26, and 31 were discarded.  FLUIDS:  500 mL of lactated ringers.  ESTIMATED BLOOD LOSS:  100 mL.  COMPLICATIONS:  None.  INDICATIONS:  The patient had a history of hypertension and atypical chest pain.  The patient was evaluated and it was determined that the removal of all remaining teeth with alveoloplasty was required to reduce the risks of dental infection affecting the patients systemic health.  OPERATIVE FINDINGS:  The patient was examined in the day surgery center operating room #3.  The teeth were identified for extraction due to significant chronic periodontal disease, multiple mobile teeth, and presence of multiple periapical  radiolucencies.  DESCRIPTION OF PROCEDURE:  The patient was brought to the day surgery center operating room #3.  The patient was placed in the supine position on operating room table.  Monitored anesthesia care was achieved per the anesthesia team. The patient was prepped and draped in the usual sterile manner for an oral surgical procedure.  The oral cavity was thoroughly examined with the findings as noted above.  The patient was then ready for the oral surgical procedure as follows:  Local anesthesia was administered sequentially with a total utilization of six carpules each containing 36 mg of Xylocaine and 0.018 mg of epinephrine, as well as two carpules each containing 9 mg of Marcaine with 0.009 mg of epinephrine.  The maxillary right quadrant was first approached.  Anesthesia was delivered as above.  A Woodson elevator was utilized to remove the soft tissue from the hard tissue.  A 15 blade incision was made from the distal of tooth #2 through the mesial of tooth #5.  A surgical flap was then reflected.  The teeth were then subluxated with a series of straight elevators.  Tooth # 4, 5, 3, were then removed with a 150 forceps without complications.  The root segments associated with tooth #2 were then removed with the rongeurs.  Alveoloplasty was performed utilizing the rongeurs and bone file.  The surgical site was then irrigated with copious amounts  of sterile saline.  The surgical site was then closed after the tissues were trimmed appropriately.  The surgical site was closed utilizing 3-0 chromic cat suture in a continuous interrupted suture technique x 1.  Of note, the surgical site was not closed by primary means due to significant dehissence in the soft tissues associated with chronic periodontal disease and exposure of the buccal roots.  This area will be followed appropriately.  The surgeons attention was then drawn to the upper left quadrant.  A Woodson elevator was  utilized to subluxate tooth #11 and 13.  A 15 blade incision was made from the distal of tooth #13 through the mesial of tooth #10.  Surgical flap was then reflected.  Tooth #11 and 13 were then removed with a 150 forceps without complications.  Alveoloplasty was performed utilizing a rongeurs and bone file.  The surgical site was then irrigated with copious amounts of sterile saline.  The surgical site was then closed utilizing a continuous interrupted suture technique and 3-0 chromic gut suture.  The surgeons attention was then drawn to the mandibular arch.  Anesthesia was delivered as previously described.  A Woodson elevator was then utilized to remove the soft tissue from the hard tissue around tooth #20, 22, and 26, as well as 31.  A 15 blade incision was made from the distal of tooth #19 through the mesial of tooth #24.  A surgical flap was then reflected.  Tooth #20 and 22 were then removed with a 151 forceps without complications.  Alveoloplasty was performed utilizing rongeurs and bone file.  A periodontal defect was again noted on the facial aspect of tooth #22 which made primary closure of this area very difficult.  The tissues were approximated and trimmed appropriately.  The surgical site was then irrigated again with copious amounts of sterile saline.  The surgical site was then closed utilizing 3-0 chromic gut suture in a continuous interrupted suture technique from tooth #19 through the mesial of tooth #24.  A separate interrupted suture was utilized to help close the dehissence in the area of tooth #22 with one interrupted suture.  Tooth #26 was then removed with a 151 forceps without complications. This area was not sutured closed.  Tooth #31 was then approached.  A 23 forceps was then utilized to remove tooth #31 without complications.  The surgical site was then serrated and compressed appropriately.  The surgical site was then irrigated with copious amounts of sterile  saline.  The surgical site was then closed utilizing one figure-of-eight suture with a 3-0 chromic  gut material.  The patients mouth was then irrigated with copious amounts of sterile saline.  The patient was examined for complications, seeing none, the patient was deemed ready to be handed over to the anesthesia team.  The patient was indeed handed over to the anesthesia team in stable condition. The patient was then taken to the postanesthesia care unit at this time. DD:  03/06/00 TD:  03/07/00 Job: [id number]

## 2011-05-06 ENCOUNTER — Encounter: Payer: Medicare Other | Admitting: Internal Medicine

## 2011-05-20 ENCOUNTER — Other Ambulatory Visit: Payer: Self-pay | Admitting: *Deleted

## 2011-05-20 MED ORDER — FERROUS SULFATE 325 (65 FE) MG PO TABS: 325.0000 mg | ORAL_TABLET | Freq: Two times a day (BID) | ORAL | Status: DC

## 2011-05-20 MED ORDER — LISINOPRIL 5 MG PO TABS: 5.0000 mg | ORAL_TABLET | Freq: Every day | ORAL | Status: DC

## 2011-05-20 MED ORDER — AMLODIPINE BESYLATE 5 MG PO TABS: 5.0000 mg | ORAL_TABLET | Freq: Every day | ORAL | Status: DC

## 2011-05-20 MED ORDER — DONEPEZIL HCL 10 MG PO TABS: 10.0000 mg | ORAL_TABLET | Freq: Every day | ORAL | Status: DC

## 2011-05-20 NOTE — Telephone Encounter (Signed)
Dr Eben Burow is away

## 2011-06-18 ENCOUNTER — Ambulatory Visit (INDEPENDENT_AMBULATORY_CARE_PROVIDER_SITE_OTHER): Payer: Medicare Other | Admitting: Internal Medicine

## 2011-06-18 VITALS — BP 132/76 | HR 74 | Temp 98.2°F | Ht 63.5 in | Wt 116.5 lb

## 2011-06-18 DIAGNOSIS — I10 Essential (primary) hypertension: Secondary | ICD-10-CM

## 2011-06-18 DIAGNOSIS — R51 Headache: Secondary | ICD-10-CM

## 2011-06-18 DIAGNOSIS — M1710 Unilateral primary osteoarthritis, unspecified knee: Secondary | ICD-10-CM

## 2011-06-18 DIAGNOSIS — M25569 Pain in unspecified knee: Secondary | ICD-10-CM

## 2011-06-18 DIAGNOSIS — N259 Disorder resulting from impaired renal tubular function, unspecified: Secondary | ICD-10-CM

## 2011-06-18 MED ORDER — ACETAMINOPHEN 500 MG PO TABS: 500.0000 mg | ORAL_TABLET | Freq: Four times a day (QID) | ORAL | Status: DC | PRN

## 2011-06-18 NOTE — Assessment & Plan Note (Signed)
Blood test was done very recently and patient's creatinine is at baseline 1.3. No need for blood tested at the

## 2011-06-18 NOTE — Patient Instructions (Signed)
Arthritis - Degenerative, Osteoarthritis You have osteoarthritis. This is the wear and tear arthritis that comes with aging. It is also called degenerative arthritis. This is common in people past middle age. It is caused by stress on the joints from living. The large weight bearing joints of the lower extremities are most often affected. The knees, hips, back, neck, and hands can become painful, swollen, and stiff. This is the most common type of arthritis. It comes on with age, carrying too much weight, and from injury. Treatment includes resting the sore joint until the pain and swelling improve. Crutches or a walker may be needed for severe flares. Only take over-the-counter or prescription medicines for pain, discomfort, or fever as directed by your caregiver. Local heat therapy may improve motion. Cortisone shots into the joint are sometimes used to reduce pain and swelling during flares. Osteoarthritis is usually not crippling and progresses slowly. There are things you can do to decrease pain:  Avoid high impact activities.   Exercise regularly.   Low impact exercises such as walking, biking and swimming help to keep the muscles strong and keep normal joint function.   Stretching helps to keep your range of motion.   Lose weight if you are overweight. This reduces joint stress.  In severe cases when you have pain at rest or increasing disability, joint surgery may be helpful. See your caregiver for follow-up treatment as recommended.  SEEK IMMEDIATE MEDICAL CARE IF:  You have severe joint pain.   Marked swelling and redness in your joint develops.   You develop a high fever.  Document Released: 11/04/2005 Document Re-Released: 03/05/2008 [employer] Patient Information 2011 [employer]. 

## 2011-06-18 NOTE — Assessment & Plan Note (Signed)
Well controlled at this time. No new changes to the medication list.

## 2011-06-18 NOTE — Assessment & Plan Note (Signed)
Given her age I would not start a fancy workup for the headache. I have started patient on Tylenol which would take care of the headache. Patient's blood pressure is well-controlled at this time and had a recent eye exam done. I will ask for the eye exam results to rule out glaucoma.

## 2011-06-18 NOTE — Progress Notes (Signed)
  Subjective:    Patient ID: Katherine [name], female    DOB: October 04, 1923, 77 y.o.   MRN: [id number]  HPI Patient is 77 year old woman with past medical history is total the chart. Patient said today for regular followup visit. Patient complains of pain in her right knee which has been present in the past. Patient was prescribed tramadol in the past but she has not had refilled it recently. Patient is relatively active and gets around with the help of cane. The pain interferes with the mobility.  Blood pressure is well controlled today 132/76.  No other complaints at this time, patient will followup in 6 months.   Review of Systems  Constitutional: Negative for fever, activity change and appetite change.  HENT: Negative for sore throat.   Respiratory: Negative for cough and shortness of breath.   Cardiovascular: Negative for chest pain and leg swelling.  Gastrointestinal: Negative for nausea, abdominal pain, diarrhea, constipation and abdominal distention.  Genitourinary: Negative for frequency, hematuria and difficulty urinating.  Musculoskeletal: Positive for arthralgias.  Neurological: Negative for dizziness and headaches.  Psychiatric/Behavioral: Negative for suicidal ideas and behavioral problems.       Objective:   Physical Exam  Constitutional: She is oriented to person, place, and time. She appears well-developed and well-nourished.  HENT:  Head: Normocephalic and atraumatic.  Eyes: Conjunctivae and EOM are normal. Pupils are equal, round, and reactive to light. No scleral icterus.  Neck: Normal range of motion. Neck supple. No JVD present. No thyromegaly present.  Cardiovascular: Normal rate, regular rhythm, normal heart sounds and intact distal pulses.  Exam reveals no gallop and no friction rub.   No murmur heard. Pulmonary/Chest: Effort normal and breath sounds normal. No respiratory distress. She has no wheezes. She has no rales.  Abdominal: Soft. Bowel sounds are normal.  She exhibits no distension and no mass. There is no tenderness. There is no rebound and no guarding.  Musculoskeletal: Normal range of motion. She exhibits no edema and no tenderness.  Lymphadenopathy:    She has no cervical adenopathy.  Neurological: She is alert and oriented to person, place, and time.  Psychiatric: She has a normal mood and affect. Her behavior is normal.          Assessment & Plan:   No problem-specific assessment & plan notes found for this encounter.

## 2011-06-18 NOTE — Assessment & Plan Note (Signed)
I will prescribe her Tylenol extra strength one tablet every 4-6 hours for knee pain.

## 2011-08-08 LAB — COMPREHENSIVE METABOLIC PANEL
ALT: 10
AST: 20
Alkaline Phosphatase: 48
CO2: 26
Calcium: 9.5
GFR calc Af Amer: 57 — ABNORMAL LOW
GFR calc non Af Amer: 47 — ABNORMAL LOW
Glucose, Bld: 95
Potassium: 4.1
Sodium: 139
Total Protein: 7.4

## 2011-08-08 LAB — URINALYSIS, ROUTINE W REFLEX MICROSCOPIC
Glucose, UA: NEGATIVE
Hgb urine dipstick: NEGATIVE
Specific Gravity, Urine: <1.005 — ABNORMAL LOW
pH: 6

## 2011-08-08 LAB — BASIC METABOLIC PANEL
BUN: 17
CO2: 26
Chloride: 109
Potassium: 3.9

## 2011-08-08 LAB — CBC
HCT: 27.8 — ABNORMAL LOW
Hemoglobin: 10.1 — ABNORMAL LOW
MCHC: 33.1
MCV: 86.8
Platelets: 185
RBC: 3.26 — ABNORMAL LOW
RBC: 3.5 — ABNORMAL LOW
WBC: 4.8
WBC: 5.4

## 2011-08-08 LAB — URINE CULTURE: Colony Count: 4000

## 2011-08-08 LAB — PROTIME-INR
INR: 1
Prothrombin Time: 13.6

## 2011-08-08 LAB — URINE MICROSCOPIC-ADD ON

## 2011-08-08 LAB — I-STAT 8, (EC8 V) (CONVERTED LAB)
BUN: 20
Bicarbonate: 21.8
Chloride: 109
Glucose, Bld: 97
pCO2, Ven: 30.3 — ABNORMAL LOW
pH, Ven: 7.465 — ABNORMAL HIGH

## 2011-08-08 LAB — DIFFERENTIAL
Basophils Relative: 0
Eosinophils Absolute: 0.1
Eosinophils Relative: 2
Lymphs Abs: 1.2
Monocytes Relative: 9
Neutrophils Relative %: 68

## 2011-08-08 LAB — CARDIAC PANEL(CRET KIN+CKTOT+MB+TROPI)
CK, MB: 0.7
CK, MB: 0.8
Relative Index: INVALID
Total CK: 42
Troponin I: 0.02

## 2011-08-08 LAB — RAPID URINE DRUG SCREEN, HOSP PERFORMED
Amphetamines: NOT DETECTED
Barbiturates: NOT DETECTED
Benzodiazepines: NOT DETECTED
Tetrahydrocannabinol: NOT DETECTED

## 2011-08-08 LAB — LIPID PANEL
Cholesterol: 163
LDL Cholesterol: 100 — ABNORMAL HIGH
VLDL: 11

## 2011-08-08 LAB — CK TOTAL AND CKMB (NOT AT ARMC): Relative Index: INVALID

## 2011-08-08 LAB — HOMOCYSTEINE: Homocysteine: 20.7 — ABNORMAL HIGH

## 2011-08-20 ENCOUNTER — Encounter: Payer: Medicare Other | Admitting: Internal Medicine

## 2011-09-09 ENCOUNTER — Encounter: Payer: Medicare Other | Admitting: Internal Medicine

## 2011-10-09 ENCOUNTER — Encounter: Payer: Self-pay | Admitting: Internal Medicine

## 2011-10-09 ENCOUNTER — Ambulatory Visit (INDEPENDENT_AMBULATORY_CARE_PROVIDER_SITE_OTHER): Payer: Medicare Other | Admitting: Internal Medicine

## 2011-10-09 VITALS — BP 160/96 | HR 88 | Temp 97.8°F | Ht 63.0 in | Wt 120.9 lb

## 2011-10-09 DIAGNOSIS — M25519 Pain in unspecified shoulder: Secondary | ICD-10-CM

## 2011-10-09 DIAGNOSIS — M25569 Pain in unspecified knee: Secondary | ICD-10-CM

## 2011-10-09 DIAGNOSIS — M19019 Primary osteoarthritis, unspecified shoulder: Secondary | ICD-10-CM

## 2011-10-09 DIAGNOSIS — M19011 Primary osteoarthritis, right shoulder: Secondary | ICD-10-CM

## 2011-10-09 NOTE — Progress Notes (Signed)
  Subjective:    Patient ID: Katherine [name], female    DOB: [birth year], 77 y.o.   MRN: [id number]  HPI  Katherine [name] is a 77 year old female with past medical history as noted in the chart.  This is a regular follow up visit.  She is complaining of pain in her right shoulder, 5/10 in intensity, chronic, anterior, non radiating, exacerbated by minimal movement, no relieving factors. She said that intra articular injections have worked well for her in the past.  She is also complaining of pain in her right knee which is chronic.  No other complaints today.   Review of Systems  Constitutional: Negative for fever, activity change and appetite change.  HENT: Negative for sore throat.   Respiratory: Negative for cough and shortness of breath.   Cardiovascular: Negative for chest pain and leg swelling.  Gastrointestinal: Negative for nausea, abdominal pain, diarrhea, constipation and abdominal distention.  Genitourinary: Negative for frequency, hematuria and difficulty urinating.  Musculoskeletal: Positive for myalgias and arthralgias.  Neurological: Negative for dizziness and headaches.  Psychiatric/Behavioral: Negative for suicidal ideas and behavioral problems.       Objective:   Physical Exam  Constitutional: She is oriented to person, place, and time. She appears well-developed and well-nourished.  HENT:  Head: Normocephalic and atraumatic.  Eyes: Conjunctivae and EOM are normal. Pupils are equal, round, and reactive to light. No scleral icterus.  Neck: Normal range of motion. Neck supple. No JVD present. No thyromegaly present.  Cardiovascular: Normal rate, regular rhythm, normal heart sounds and intact distal pulses.  Exam reveals no gallop and no friction rub.   No murmur heard. Pulmonary/Chest: Effort normal and breath sounds normal. No respiratory distress. She has no wheezes. She has no rales.  Abdominal: Soft. Bowel sounds are normal. She exhibits no distension and no mass.  There is no tenderness. There is no rebound and no guarding.  Musculoskeletal: She exhibits tenderness. She exhibits no edema.       Right shoulder: She exhibits decreased range of motion, tenderness, bony tenderness and pain.       Right knee: She exhibits decreased range of motion. tenderness found.  Lymphadenopathy:    She has no cervical adenopathy.  Neurological: She is alert and oriented to person, place, and time.  Psychiatric: She has a normal mood and affect. Her behavior is normal.          Assessment & Plan:

## 2011-10-09 NOTE — Patient Instructions (Signed)
Joint Injection Care After Refer to this sheet in the next few days. These instructions provide you with information on caring for yourself after you have had a joint injection. Your caregiver also may give you more specific instructions. Your treatment has been planned according to current medical practices, but problems sometimes occur. Call your caregiver if you have any problems or questions after your procedure. After any type of joint injection, it is not uncommon to experience:  Soreness, swelling, or bruising around the injection site.   Mild numbness, tingling, or weakness around the injection site caused by the numbing medicine used before or with the injection.  It also is possible to experience the following effects associated with the specific agent after injection:  Iodine-based contrast agents:   Allergic reaction (itching, hives, widespread redness, and swelling beyond the injection site).   Corticosteroids (These effects are rare.):   Allergic reaction.   Increased blood sugar levels (If you have diabetes and you notice that your blood sugar levels have increased, notify your caregiver).   Increased blood pressure levels.   Mood swings.   Hyaluronic acid in the use of viscosupplementation.   Temporary heat or redness.   Temporary rash and itching.   Increased fluid accumulation in the injected joint.  These effects all should resolve within a day after your procedure.  HOME CARE INSTRUCTIONS  Limit yourself to light activity the day of your procedure. Avoid lifting heavy objects, bending, stooping, or twisting.   Take prescription or over-the-counter pain medication as directed by your caregiver.   You may apply ice to your injection site to reduce pain and swelling the day of your procedure. Ice may be applied 3 to 4 times:   Put ice in a plastic bag.   Place a towel between your skin and the bag.   Leave the ice on for no longer than 15 to 20 minutes  each time.  SEEK IMMEDIATE MEDICAL CARE IF:   Pain and swelling get worse rather than better or extend beyond the injection site.   Numbness does not go away.   Blood or fluid continues to leak from the injection site.   You have chest pain.   You have swelling of your face or tongue.   You have trouble breathing or you become dizzy.   You develop a fever, chills, or severe tenderness at the injection site that last longer than 1 day.  MAKE SURE YOU:  Understand these instructions.   Watch your condition.   Get help right away if you are not doing well or if you get worse.  Document Released: 07/18/2011 Document Reviewed: 06/19/2011 [facility] Patient Information 2012 [place], [place].

## 2011-10-14 NOTE — Assessment & Plan Note (Signed)
We injected 0.5 mg triamcinolone with 1 ml of lidocaine 1% into the right intraarticular joint in the subacromial region. Patient reported immediate relief. She will be evaluated next week in the clinic.

## 2011-10-14 NOTE — Assessment & Plan Note (Signed)
Patient will be benefited from an intraarticular joint injection which we plan to administer at next office visit.

## 2011-10-16 ENCOUNTER — Encounter: Payer: Medicare Other | Admitting: Internal Medicine

## 2011-10-29 ENCOUNTER — Ambulatory Visit (INDEPENDENT_AMBULATORY_CARE_PROVIDER_SITE_OTHER): Payer: Medicare Other | Admitting: Internal Medicine

## 2011-10-29 ENCOUNTER — Encounter: Payer: Self-pay | Admitting: Internal Medicine

## 2011-10-29 DIAGNOSIS — R05 Cough: Secondary | ICD-10-CM

## 2011-10-29 DIAGNOSIS — R413 Other amnesia: Secondary | ICD-10-CM

## 2011-10-29 DIAGNOSIS — F0280 Dementia in other diseases classified elsewhere without behavioral disturbance: Secondary | ICD-10-CM

## 2011-10-29 MED ORDER — GUAIFENESIN-DM 100-10 MG/5ML PO SYRP: 5.0000 mL | ORAL_SOLUTION | Freq: Three times a day (TID) | ORAL | Status: AC | PRN

## 2011-10-29 MED ORDER — MEMANTINE HCL 10 MG PO TABS: 10.0000 mg | ORAL_TABLET | Freq: Two times a day (BID) | ORAL | Status: DC

## 2011-10-29 NOTE — Assessment & Plan Note (Signed)
Appears to be a simple upper rest or infection, lungs are clear, patient has no fever, will prescribe mucolytic/cough suppressant as needed advised the patient to return if the symptoms are worse

## 2011-10-29 NOTE — Progress Notes (Signed)
  Subjective:    Patient ID: Katherine [name], female    DOB: 06/22/1923, 77 y.o.   MRN: [id number]  HPI  Patient is an 77 year old female with a past medical history listed below, but does get up and symptoms of upper rest or infection for the past several days patient denies any fevers, but has been having cough which is preventing her from sleeping. Note that the patient's grandson is present and states that his grandmother's dementia has worsened over the past several months and is wondering there is any further workup or management we did do. No other complaints  Patient Active Problem List  Diagnoses  . VITAMIN B12 DEFICIENCY  . HYPERLIPIDEMIA  . ANEMIA, FOLATE-DEFICIENCY  . DISORDER, DYSTHYMIC  . HYPERTENSION  . AV BLOCK, 1ST DEGREE  . TIA  . ANGIODYSPLASIA OF STOMACH&DUODENUM W/HEMORRHAGE  . RENAL INSUFFICIENCY  . BREAST MASS, LEFT  . RHEUMATOID ARTHRITIS  . Pain in joint, shoulder region  . KNEE PAIN, RIGHT, CHRONIC  . OSTEOPOROSIS  . SYMPTOM, MEMORY LOSS   Current Outpatient Prescriptions on File Prior to Visit  Medication Sig Dispense Refill  . acetaminophen (TYLENOL) 500 MG tablet Take 1 tablet (500 mg total) by mouth every 6 (six) hours as needed for pain.  100 tablet  2  . amLODipine (NORVASC) 5 MG tablet Take 1 tablet (5 mg total) by mouth daily.  30 tablet  3  . calcium carbonate (OS-CAL) 600 MG TABS Take 1 tablet (600 mg total) by mouth 2 (two) times daily with meals.  60 tablet  12  . donepezil (ARICEPT) 10 MG tablet Take 1 tablet (10 mg total) by mouth at bedtime.  30 tablet  3  . ferrous sulfate (ECK FERROUS SULFATE) 325 (65 FE) MG tablet Take 1 tablet (325 mg total) by mouth 2 (two) times daily.  60 tablet  3  . lisinopril (PRINIVIL,ZESTRIL) 5 MG tablet Take 1 tablet (5 mg total) by mouth daily.  30 tablet  3  . nitroGLYCERIN (NITROSTAT) 0.4 MG SL tablet Place 1 tablet (0.4 mg total) under the tongue as directed. 1 tab under the tongue for chest pain  90 tablet  1    Allergies  Allergen Reactions  . Simvastatin     REACTION: nightmares     Review of Systems  Constitutional: Negative for fever.  Respiratory: Positive for cough. Negative for wheezing.        Objective:   Physical Exam  Vitals reviewed. Constitutional: She appears well-developed and well-nourished.  HENT:  Head: Normocephalic and atraumatic.  Eyes: Pupils are equal, round, and reactive to light.  Neck: Normal range of motion. Neck supple. No JVD present. No thyromegaly present.  Cardiovascular: Normal rate, regular rhythm and normal heart sounds.   No murmur heard. Pulmonary/Chest: Effort normal and breath sounds normal. She has no wheezes. She has no rales.  Abdominal: Soft. Bowel sounds are normal.  Musculoskeletal: Normal range of motion. She exhibits no edema.  Neurological: She is alert.  Skin: Skin is warm and dry.          Assessment & Plan:

## 2011-10-29 NOTE — Assessment & Plan Note (Signed)
Patient is on optimum dose of Aricept, however her dementia seems to be progressing, prior labs to rule out other reversible causes were negative such as RPR, TSH, iron studies, and depression screens. Will add memantine today. And followup with her primary care physician, there is decision can be made whether she would be more suitable to stay at a nursing home for optimal management of her care.

## 2011-10-29 NOTE — Patient Instructions (Signed)
Please take any medication memantine for memory Please take cough syrup as needed and stop taking it and her cough has resolved.

## 2012-02-05 ENCOUNTER — Encounter: Payer: Self-pay | Admitting: Internal Medicine

## 2012-02-05 ENCOUNTER — Ambulatory Visit (INDEPENDENT_AMBULATORY_CARE_PROVIDER_SITE_OTHER): Payer: Medicare Other | Admitting: Internal Medicine

## 2012-02-05 VITALS — BP 165/77 | HR 73 | Temp 97.0°F | Ht 66.0 in | Wt 129.7 lb

## 2012-02-05 DIAGNOSIS — F0390 Unspecified dementia without behavioral disturbance: Secondary | ICD-10-CM

## 2012-02-05 DIAGNOSIS — B070 Plantar wart: Secondary | ICD-10-CM

## 2012-02-05 DIAGNOSIS — L989 Disorder of the skin and subcutaneous tissue, unspecified: Secondary | ICD-10-CM

## 2012-02-05 NOTE — Progress Notes (Signed)
Subjective:     Patient ID: Katherine [name], female   DOB: 05/16/1923, 78 y.o.   MRN: [id number]  HPI Patient is an 78 y/o F with hx of dementia who presents today for a few acute concerns.  Her daughter is present and provides the majority of the HPI.  Knee pain: patient reports pain in her right knee that she describes as achy.  Daughter states pt is using tramadol at night for pain relief; this also seems to help the pt sleep.  Pt has tried OTC ASA with no relief of symptoms  Lesion on foot: pt reports lesion on foot that is intermittently painful.  This has been present for many weeks.  Lesion on forehead: pt has a lesion on the right side of her forehead.  Pt states it was present from birth; daughter states is is something that evolved over the past months-years.  Pt states it is not painful bu occasionally itches.  No reported drainage of blood, clear, or purulent material.  Dementia: daughter reports increased wandering behavior  States she has to put the couch in front of the door to prevent the pt from wandering outside at night.  Daughter also notes pt is increasingly confused in the evenings with increased combativeness and cursing.  Daughter also notes pt memory is worsening; more problems with short term recall, pt believes she is a young girl again, etc.     Review of Systems  Constitutional: Negative for fever, chills, diaphoresis, activity change, appetite change, fatigue and unexpected weight change.  HENT: Negative for hearing loss, congestion and neck stiffness.   Eyes: Negative for photophobia, pain and visual disturbance.  Respiratory: Negative for cough, chest tightness, shortness of breath and wheezing.   Cardiovascular: Negative for chest pain and palpitations.  Gastrointestinal: Negative for abdominal pain, blood in stool and anal bleeding.  Genitourinary: Negative for dysuria, hematuria and difficulty urinating.  Musculoskeletal: Negative for joint swelling.    Neurological: Negative for dizziness, syncope, speech difficulty, weakness, numbness and headaches.      Objective:   Physical Exam Vital signs reviewed GEN: No apparent distress.  Alert and oriented to self only.  Pleasant,and cooperative to exam.  Speech is occasionally confabulatory.  Patient able to appropriately answer yes/no questions.  Able to follow 2 step commands HEENT: head is autraumatic and normocephalic.  Neck is supple without palpable masses or lymphadenopathy.EOMI.  PERRLA.  Sclerae anicteric.  Conjunctivae without pallor or injection. Mucous membranes are moist.  Oropharynx is without erythema, exudates, or other abnormal lesions. ~2cm x 2cm well-circumscribed, firm, freely mobile, circular nodule noted.  This lesion is flesh-colored, ~1cm in height, non-tender to palpation, with a central pustule.  No drainage expressed on exam RESP:  Lungs are clear to ascultation bilaterally with good air movement.  No wheezes, ronchi, or rubs. CARDIOVASCULAR: regular rate, normal rhythm.   ABDOMEN: soft, non-tender, non-distended.  Bowels sounds present in all quadrants and normoactive.  No palpable masses. EXT: warm and dry.  Peripheral pulses equal, intact, and +2 globally.  No clubbing or cyanosis.  No edema in bilateral lower extremities.  Thick callous noted on atero-medial aspect of right foot with central darkening.  Mild tenderness to palpation; appearance consistent with plantar wart. SKIN: warm and dry with normal turgor.        Assessment/Plan:

## 2012-02-05 NOTE — Patient Instructions (Signed)
Schedule a follow up appointment with you primary care doctor We will refer you to a dermatologist and podiatrist (foot doctor). We will get you in touch with our social worker to discuss the PACE program and other options for adult day care and home services

## 2012-02-05 NOTE — Assessment & Plan Note (Addendum)
Katherine [name] has moderate-severe dementia.  She is alert and oriented only to self.  She is currently on the maximum dose of Namenda and Aricept.  I believe her symptoms of increased confusion, worsening memory problems, increased combativeness/wandering, etc reflect progression of her dementia.  Will obtain urine analysis and basic blood work to rule out any acute process that may be contributing to her symptoms.  Will review current literature regarding recommendations for treatment of behavioral problems as a result of dementia and discuss possible medications (antipsychotic/benzos) with attending physician with plan to call patient's daughter tomorrow with recommendations.  The patient's daughter, Katherine [name], is the patient's primary caretaker and is experiencing significant stress regarding her mother's progressive dementia, increased needs, behavioral issues, etc.  the daughter states she will keep her mother at home with her and refuses to consider ALF/SNF placement for her mom.  She is open to options for adult daycare and additional help in the home.  Discussed PACE; daughter is very amenable to the idea; believe both the patient and the daughter would benefit from PACE or similar program.  Will place referral to social work today so that daughter may explore additional options for help caring for her mother.    Addendum: will have patient return for lab work; unable to be completed at the time of visit.

## 2012-02-06 ENCOUNTER — Telehealth: Payer: Self-pay | Admitting: Licensed Clinical Social Worker

## 2012-02-06 ENCOUNTER — Telehealth: Payer: Self-pay | Admitting: Internal Medicine

## 2012-02-06 DIAGNOSIS — F0280 Dementia in other diseases classified elsewhere without behavioral disturbance: Secondary | ICD-10-CM

## 2012-02-06 DIAGNOSIS — R451 Restlessness and agitation: Secondary | ICD-10-CM

## 2012-02-06 DIAGNOSIS — F0390 Unspecified dementia without behavioral disturbance: Secondary | ICD-10-CM

## 2012-02-06 DIAGNOSIS — M25569 Pain in unspecified knee: Secondary | ICD-10-CM

## 2012-02-06 MED ORDER — CITALOPRAM HYDROBROMIDE 10 MG PO TABS: 10.0000 mg | ORAL_TABLET | Freq: Every day | ORAL | Status: DC

## 2012-02-06 MED ORDER — LISINOPRIL 5 MG PO TABS: 5.0000 mg | ORAL_TABLET | Freq: Every day | ORAL | Status: DC

## 2012-02-06 MED ORDER — FERROUS SULFATE 325 (65 FE) MG PO TABS: 325.0000 mg | ORAL_TABLET | Freq: Two times a day (BID) | ORAL | Status: DC

## 2012-02-06 MED ORDER — AMLODIPINE BESYLATE 5 MG PO TABS: 5.0000 mg | ORAL_TABLET | Freq: Every day | ORAL | Status: DC

## 2012-02-06 MED ORDER — MEMANTINE HCL 10 MG PO TABS: 10.0000 mg | ORAL_TABLET | Freq: Two times a day (BID) | ORAL | Status: DC

## 2012-02-06 MED ORDER — ACETAMINOPHEN 500 MG PO TABS: 500.0000 mg | ORAL_TABLET | Freq: Four times a day (QID) | ORAL | Status: AC | PRN

## 2012-02-06 MED ORDER — DONEPEZIL HCL 10 MG PO TABS: 10.0000 mg | ORAL_TABLET | Freq: Every day | ORAL | Status: DC

## 2012-02-06 NOTE — Assessment & Plan Note (Signed)
This lesion is not consistent with abscess formation, squamous cell carcinoma, basal cell carcinoma, or melanoma.  Despite its pustular appearance, no drainage was expressed on exam.  I believe the patient's skin lesion may be a sebaceous cyst.  Will refer patient to dermatology for further evaluation and treatment.

## 2012-02-06 NOTE — Telephone Encounter (Signed)
Called patient's daughter and primary care give, Rhona Raider, to discuss options for management of pts increased agitation and behavioral issues.  Per discussion with attending and review of up to date, trial of SSRI may be of benefit before proceeding to antipsychotics or benzodiazepines.  Discussed all options with the daughter, will begin with trial of celexa as this is the safest option.  Will start with 5mg  daily for 3-4 days and then increase to a full 10mg  tablet daily.  Informed daughter that it may take weeks before any significant benefit is noted and that further dose escalation may be indicated.  Daughter expresses understanding and agreement to plan.  Follow up with the daughter about possible referral to PACE.  Daughter states that she is interested in this and is going to investigate PACE and similar programs in more detail.  Daughter also requests refills for all other meds.  Will send in scripts to [facility].

## 2012-02-06 NOTE — Assessment & Plan Note (Signed)
Plantar wart is present on the pedal aspect of patient's medial right foot.  Advised daughter that using OTC "corn pads" would not be harmful but would likely not result with definitive treatment.  This lesion causes the patient's discomfort and fracture her inability to ambulate. Will refer her to podiatry today for further evaluation and treatment.  >30 min with 50% face to face time spent counseling patient and daughter about patient's chronic medical conditions and establishing a plan for continued management; time also spent reviewing records and coordinating care with other healthcare providers.

## 2012-02-06 NOTE — Telephone Encounter (Signed)
CSW placed call to pt.  Pt's dau, Donnie Coffin. Answered phone.  Dau requesting additional information on community resources for pt d/t pt's dx of dementia.  CSW discussed PACE and ACE.  Per daughter's request CSW placed information in mail to pt's address.  CSW will also include community resources dealing with dementia.

## 2012-02-07 ENCOUNTER — Other Ambulatory Visit: Payer: Self-pay | Admitting: *Deleted

## 2012-02-07 DIAGNOSIS — R451 Restlessness and agitation: Secondary | ICD-10-CM

## 2012-02-07 DIAGNOSIS — F0390 Unspecified dementia without behavioral disturbance: Secondary | ICD-10-CM

## 2012-02-07 MED ORDER — CITALOPRAM HYDROBROMIDE 10 MG PO TABS: 10.0000 mg | ORAL_TABLET | Freq: Every day | ORAL | Status: DC

## 2012-02-26 NOTE — Progress Notes (Signed)
Addended by: Neomia Dear on: 02/26/2012 08:15 PM   Modules accepted: Orders

## 2012-04-20 ENCOUNTER — Encounter: Payer: Medicare Other | Admitting: Internal Medicine

## 2012-04-22 ENCOUNTER — Other Ambulatory Visit: Payer: Self-pay | Admitting: Internal Medicine

## 2012-04-22 DIAGNOSIS — Z658 Other specified problems related to psychosocial circumstances: Secondary | ICD-10-CM

## 2012-04-27 ENCOUNTER — Telehealth: Payer: Self-pay | Admitting: Licensed Clinical Social Worker

## 2012-04-27 NOTE — Telephone Encounter (Signed)
Katherine [name] was referred to CSW due to several missed appt's possibly due to transportation.  Pt has medicaid and would be eligible for [employer].  CSW attempted to call pt/daughter to provide phone number to [facility] [phone] and/or Medicaid Transportation [phone] to get signed up.  Phone number listed for both pt and daughter is currently not in service.

## 2012-06-04 ENCOUNTER — Encounter: Payer: Medicare Other | Admitting: Internal Medicine

## 2012-06-04 NOTE — Addendum Note (Signed)
Addended by: Angelina Ok F on: 06/04/2012 04:18 PM   Modules accepted: Orders

## 2012-06-30 ENCOUNTER — Encounter: Payer: Self-pay | Admitting: Internal Medicine

## 2012-07-12 ENCOUNTER — Emergency Department (HOSPITAL_COMMUNITY): Payer: PRIVATE HEALTH INSURANCE

## 2012-07-12 ENCOUNTER — Encounter (HOSPITAL_COMMUNITY): Payer: Self-pay

## 2012-07-12 ENCOUNTER — Inpatient Hospital Stay (HOSPITAL_COMMUNITY)
Admission: EM | Admit: 2012-07-12 | Discharge: 2012-07-16 | DRG: 377 | Disposition: A | Discharge status: Home Health | Payer: PRIVATE HEALTH INSURANCE | Attending: Internal Medicine | Admitting: Internal Medicine

## 2012-07-12 DIAGNOSIS — Z87891 Personal history of nicotine dependence: Secondary | ICD-10-CM

## 2012-07-12 DIAGNOSIS — Z8673 Personal history of transient ischemic attack (TIA), and cerebral infarction without residual deficits: Secondary | ICD-10-CM

## 2012-07-12 DIAGNOSIS — Z853 Personal history of malignant neoplasm of breast: Secondary | ICD-10-CM

## 2012-07-12 DIAGNOSIS — K922 Gastrointestinal hemorrhage, unspecified: Secondary | ICD-10-CM

## 2012-07-12 DIAGNOSIS — D529 Folate deficiency anemia, unspecified: Secondary | ICD-10-CM

## 2012-07-12 DIAGNOSIS — K31819 Angiodysplasia of stomach and duodenum without bleeding: Secondary | ICD-10-CM

## 2012-07-12 DIAGNOSIS — I214 Non-ST elevation (NSTEMI) myocardial infarction: Secondary | ICD-10-CM | POA: Diagnosis present

## 2012-07-12 DIAGNOSIS — F3289 Other specified depressive episodes: Secondary | ICD-10-CM | POA: Diagnosis present

## 2012-07-12 DIAGNOSIS — D500 Iron deficiency anemia secondary to blood loss (chronic): Secondary | ICD-10-CM | POA: Diagnosis present

## 2012-07-12 DIAGNOSIS — R531 Weakness: Secondary | ICD-10-CM

## 2012-07-12 DIAGNOSIS — M069 Rheumatoid arthritis, unspecified: Secondary | ICD-10-CM | POA: Diagnosis present

## 2012-07-12 DIAGNOSIS — N189 Chronic kidney disease, unspecified: Secondary | ICD-10-CM | POA: Diagnosis present

## 2012-07-12 DIAGNOSIS — I129 Hypertensive chronic kidney disease with stage 1 through stage 4 chronic kidney disease, or unspecified chronic kidney disease: Secondary | ICD-10-CM | POA: Diagnosis present

## 2012-07-12 DIAGNOSIS — K31811 Angiodysplasia of stomach and duodenum with bleeding: Principal | ICD-10-CM | POA: Diagnosis present

## 2012-07-12 DIAGNOSIS — R000 Tachycardia, unspecified: Secondary | ICD-10-CM | POA: Diagnosis present

## 2012-07-12 DIAGNOSIS — F329 Major depressive disorder, single episode, unspecified: Secondary | ICD-10-CM | POA: Diagnosis present

## 2012-07-12 DIAGNOSIS — M810 Age-related osteoporosis without current pathological fracture: Secondary | ICD-10-CM | POA: Diagnosis present

## 2012-07-12 DIAGNOSIS — D649 Anemia, unspecified: Secondary | ICD-10-CM

## 2012-07-12 DIAGNOSIS — R079 Chest pain, unspecified: Secondary | ICD-10-CM

## 2012-07-12 DIAGNOSIS — F0390 Unspecified dementia without behavioral disturbance: Secondary | ICD-10-CM | POA: Diagnosis present

## 2012-07-12 DIAGNOSIS — R0600 Dyspnea, unspecified: Secondary | ICD-10-CM

## 2012-07-12 DIAGNOSIS — Z79899 Other long term (current) drug therapy: Secondary | ICD-10-CM

## 2012-07-12 HISTORY — DX: Unspecified dementia, unspecified severity, without behavioral disturbance, psychotic disturbance, mood disturbance, and anxiety: F03.90

## 2012-07-12 HISTORY — DX: Diverticulosis of large intestine without perforation or abscess without bleeding: K57.30

## 2012-07-12 HISTORY — DX: Gastrointestinal hemorrhage, unspecified: K92.2

## 2012-07-12 LAB — URINE MICROSCOPIC-ADD ON

## 2012-07-12 LAB — COMPREHENSIVE METABOLIC PANEL
AST: 74 U/L — ABNORMAL HIGH (ref 0–37)
Albumin: 3.2 g/dL — ABNORMAL LOW (ref 3.5–5.2)
BUN: 28 mg/dL — ABNORMAL HIGH (ref 6–23)
CO2: 20 mEq/L (ref 19–32)
Calcium: 9.1 mg/dL (ref 8.4–10.5)
Chloride: 106 mEq/L (ref 96–112)
Creatinine, Ser: 1.15 mg/dL — ABNORMAL HIGH (ref 0.50–1.10)
GFR calc non Af Amer: 42 mL/min — ABNORMAL LOW (ref >90)
Total Bilirubin: 0.4 mg/dL (ref 0.3–1.2)

## 2012-07-12 LAB — CBC
HCT: 18.9 % — ABNORMAL LOW (ref 36.0–46.0)
MCH: 19.7 pg — ABNORMAL LOW (ref 26.0–34.0)
MCV: 70.3 fL — ABNORMAL LOW (ref 78.0–100.0)
Platelets: 232 10*3/uL (ref 150–400)
RDW: 17.6 % — ABNORMAL HIGH (ref 11.5–15.5)
WBC: 10.9 10*3/uL — ABNORMAL HIGH (ref 4.0–10.5)

## 2012-07-12 LAB — PRO B NATRIURETIC PEPTIDE: Pro B Natriuretic peptide (BNP): 7255 pg/mL — ABNORMAL HIGH (ref 0–450)

## 2012-07-12 LAB — URINALYSIS, ROUTINE W REFLEX MICROSCOPIC
Bilirubin Urine: NEGATIVE
Hgb urine dipstick: NEGATIVE
Nitrite: NEGATIVE
Protein, ur: 30 mg/dL — AB
Urobilinogen, UA: 1 mg/dL (ref 0.0–1.0)

## 2012-07-12 LAB — IRON AND TIBC: UIBC: 421 ug/dL — ABNORMAL HIGH (ref 125–400)

## 2012-07-12 LAB — VITAMIN B12: Vitamin B-12: 360 pg/mL (ref 211–911)

## 2012-07-12 LAB — MRSA PCR SCREENING: MRSA by PCR: NEGATIVE

## 2012-07-12 LAB — FOLATE: Folate: >20 ng/mL

## 2012-07-12 LAB — PREPARE RBC (CROSSMATCH)

## 2012-07-12 LAB — TROPONIN I: Troponin I: 0.81 ng/mL (ref <0.30)

## 2012-07-12 LAB — RETICULOCYTES
RBC.: 2.85 MIL/uL — ABNORMAL LOW (ref 3.87–5.11)
Retic Count, Absolute: 45.6 10*3/uL (ref 19.0–186.0)

## 2012-07-12 LAB — D-DIMER, QUANTITATIVE: D-Dimer, Quant: 14.25 ug/mL-FEU — ABNORMAL HIGH (ref 0.00–0.48)

## 2012-07-12 MED ORDER — DONEPEZIL HCL 10 MG PO TABS
10.0000 mg | ORAL_TABLET | Freq: Every day | ORAL | Status: DC
  Administered 2012-07-12 – 2012-07-15 (×4): 10 mg via ORAL
  Filled 2012-07-12 (×6): qty 1

## 2012-07-12 MED ORDER — FUROSEMIDE 10 MG/ML IJ SOLN
20.0000 mg | Freq: Once | INTRAMUSCULAR | Status: AC
  Administered 2012-07-12: 20 mg via INTRAVENOUS
  Filled 2012-07-12: qty 2

## 2012-07-12 MED ORDER — SODIUM CHLORIDE 0.9 % IJ SOLN
INTRAMUSCULAR | Status: AC
  Administered 2012-07-12: 3 mL via INTRAVENOUS
  Filled 2012-07-12: qty 10

## 2012-07-12 MED ORDER — ASPIRIN 81 MG PO CHEW
324.0000 mg | CHEWABLE_TABLET | Freq: Once | ORAL | Status: AC
  Administered 2012-07-12: 324 mg via ORAL
  Filled 2012-07-12: qty 4

## 2012-07-12 MED ORDER — ACETAMINOPHEN 325 MG PO TABS
650.0000 mg | ORAL_TABLET | Freq: Four times a day (QID) | ORAL | Status: DC | PRN
  Administered 2012-07-16: 650 mg via ORAL
  Filled 2012-07-12 (×2): qty 2

## 2012-07-12 MED ORDER — ONDANSETRON HCL 4 MG/2ML IJ SOLN: 4.0000 mg | Freq: Four times a day (QID) | INTRAMUSCULAR | Status: DC | PRN

## 2012-07-12 MED ORDER — ONDANSETRON HCL 4 MG PO TABS
4.0000 mg | ORAL_TABLET | Freq: Four times a day (QID) | ORAL | Status: DC | PRN
  Filled 2012-07-12: qty 1

## 2012-07-12 MED ORDER — PANTOPRAZOLE SODIUM 40 MG IV SOLR
40.0000 mg | Freq: Once | INTRAVENOUS | Status: AC
  Administered 2012-07-12: 40 mg via INTRAVENOUS
  Filled 2012-07-12: qty 40

## 2012-07-12 MED ORDER — ACETAMINOPHEN 650 MG RE SUPP: 650.0000 mg | Freq: Four times a day (QID) | RECTAL | Status: DC | PRN

## 2012-07-12 MED ORDER — NITROGLYCERIN 0.4 MG SL SUBL
0.4000 mg | SUBLINGUAL_TABLET | SUBLINGUAL | Status: DC | PRN
Start: 2012-07-12 — End: 2012-07-12

## 2012-07-12 MED ORDER — PANTOPRAZOLE SODIUM 40 MG IV SOLR
40.0000 mg | Freq: Two times a day (BID) | INTRAVENOUS | Status: DC
  Administered 2012-07-12 – 2012-07-13 (×2): 40 mg via INTRAVENOUS
  Filled 2012-07-12 (×3): qty 40

## 2012-07-12 MED ORDER — FUROSEMIDE 10 MG/ML IJ SOLN
INTRAMUSCULAR | Status: AC
  Administered 2012-07-12: 20 mg via INTRAVENOUS
  Filled 2012-07-12: qty 4

## 2012-07-12 MED ORDER — SODIUM CHLORIDE 0.9 % IJ SOLN
3.0000 mL | Freq: Two times a day (BID) | INTRAMUSCULAR | Status: DC
  Administered 2012-07-12 – 2012-07-15 (×6): 3 mL via INTRAVENOUS

## 2012-07-12 MED ORDER — NITROGLYCERIN 0.4 MG SL SUBL: 0.4000 mg | SUBLINGUAL_TABLET | SUBLINGUAL | Status: DC | PRN

## 2012-07-12 MED ORDER — SODIUM CHLORIDE 0.9 % IV SOLN
INTRAVENOUS | Status: DC
  Administered 2012-07-12: 20 mL/h via INTRAVENOUS
  Administered 2012-07-13: 01:00:00 via INTRAVENOUS

## 2012-07-12 MED ORDER — MORPHINE SULFATE 2 MG/ML IJ SOLN
2.0000 mg | INTRAMUSCULAR | Status: DC | PRN
  Administered 2012-07-16: 2 mg via INTRAVENOUS
  Filled 2012-07-12: qty 1

## 2012-07-12 NOTE — ED Notes (Signed)
Pt's daughterRhona Raider:  H   [phone]   [phone]

## 2012-07-12 NOTE — ED Provider Notes (Signed)
History     CSN: [id number]  Arrival date & time 07/12/12  1242   First MD Initiated Contact with Patient 07/12/12 1247      Chief Complaint  Patient presents with  . Shortness of Breath    (Consider location/radiation/quality/duration/timing/severity/associated sxs/prior treatment) Patient is a 78 y.o. female presenting with shortness of breath. The history is provided by the patient. The history is limited by the condition of the patient (pt v difficult historian, confusion - level 5 caveat).  Shortness of Breath  Associated symptoms include chest pain and shortness of breath. Pertinent negatives include no cough.  pt with sob a few days ago. Constant. Worse today. Pt denies exacerbating or alleviating factors. Appears to indicate that she has had some chest pain the past couple days, ?mild pain still present. Denies cough or fever. Denies hx lung disease, asthma, or copd. Denies hx heart disease. Pt unsure of any medication changes, and/or compliance w medication. Level 5 caveat.     Past Medical History  Diagnosis Date  . Hypertension   . Head ache   . TIA (transient ischemic attack)     Declines aggrenox  . Renal insufficiency     Baseline 1.3  . Depression     Improved with remeron  . Rheumatoid arteritis     Previously on MTX x70yr, failed appropiate f/u  . 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  Marland Kitchen Anemia, macrocytic      most likely secondary to methotrexate use  . Osteoporosis      T score- 3.0 right hip, -2.8 left hip and -2.1 lumbar spine (April 22, 2007). Not on bisphosphonate because of renal insufficiency.    Past Surgical History  Procedure Date  . Appendectomy   . Cholecystectomy   .  lung which rechecks resection left upper lobe with a video thoracoscopy 4 benign granuloma in 2001 2001    Family History    Problem Relation Age of Onset  . Cancer Sister     Died  . Diabetes Brother   . Coronary artery disease Mother     died age 71  . Diabetes Daughter     died age 1    History  Substance Use Topics  . Smoking status: Former Games developer  . Smokeless tobacco: Not on file  . Alcohol Use: No    OB History    Grav Para Term Preterm Abortions TAB SAB Ect Mult Living                  Review of Systems  Unable to perform ROS: Mental status change  HENT: Negative for neck pain.   Respiratory: Positive for shortness of breath. Negative for cough.   Cardiovascular: Positive for chest pain. Negative for leg swelling.  Gastrointestinal: Negative for abdominal pain.  Musculoskeletal: Negative for back pain.  Neurological: Negative for headaches.    Allergies  Simvastatin  Home Medications   Current Outpatient Rx  Name Route Sig Dispense Refill  . ACETAMINOPHEN 500 MG PO TABS Oral Take 1 tablet (500 mg total) by mouth every 6 (six) hours as needed for pain. 100 tablet 2  . AMLODIPINE BESYLATE 5 MG PO TABS Oral Take 1 tablet (5 mg total) by mouth daily. 30 tablet 3  . CALCIUM CARBONATE 600 MG PO TABS Oral Take 1  tablet (600 mg total) by mouth 2 (two) times daily with meals. 60 tablet 12  . CITALOPRAM HYDROBROMIDE 10 MG PO TABS Oral Take 1 tablet (10 mg total) by mouth daily. Take 1/2 of a tablet for the first 3-4 days, then increase to one tablet daily. 30 tablet 2  . DONEPEZIL HCL 10 MG PO TABS Oral Take 1 tablet (10 mg total) by mouth at bedtime. 30 tablet 3  . FERROUS SULFATE 325 (65 FE) MG PO TABS Oral Take 1 tablet (325 mg total) by mouth 2 (two) times daily. 60 tablet 3  . LISINOPRIL 5 MG PO TABS Oral Take 1 tablet (5 mg total) by mouth daily. 30 tablet 3  . MEMANTINE HCL 10 MG PO TABS Oral Take 1 tablet (10 mg total) by mouth 2 (two) times daily. 60 tablet 3  . NITROGLYCERIN 0.4 MG SL SUBL Sublingual Place 1 tablet (0.4 mg total) under the tongue as directed. 1 tab under the tongue  for chest pain 90 tablet 1    BP 128/66  Pulse 119  Temp 98.5 F (36.9 C)  SpO2 100%  Physical Exam  Nursing note and vitals reviewed. Constitutional: She appears well-developed and well-nourished. No distress.  HENT:  Head: Atraumatic.  Nose: Nose normal.  Mouth/Throat: Oropharynx is clear and moist.  Eyes: Conjunctivae are normal. No scleral icterus.  Neck: Neck supple. No tracheal deviation present.       No stiffness or rigidity  Cardiovascular: Regular rhythm, normal heart sounds and intact distal pulses.        tachycardic  Pulmonary/Chest: Effort normal. No respiratory distress.  Abdominal: Soft. Normal appearance and bowel sounds are normal. She exhibits no distension. There is no tenderness.  Genitourinary:       Medium brown stool, strongly heme positive.   Musculoskeletal: She exhibits no edema and no tenderness.  Neurological: She is alert.       Awake and alert. Speech difficult to understand. Moves bil extremities purposefully.   Skin: Skin is warm and dry. No rash noted.    ED Course  Procedures (including critical care time)   Labs Reviewed  CBC  COMPREHENSIVE METABOLIC PANEL  D-DIMER, QUANTITATIVE  PRO B NATRIURETIC PEPTIDE  TROPONIN I  URINALYSIS, ROUTINE W REFLEX MICROSCOPIC  PROTIME-INR   Results for orders placed during the hospital encounter of 07/12/12  CBC      Component Value Range   WBC 10.9 (*) 4.0 - 10.5 K/uL   RBC 2.69 (*) 3.87 - 5.11 MIL/uL   Hemoglobin 5.3 (*) 12.0 - 15.0 g/dL   HCT 16.1 (*) 09.6 - 04.5 %   MCV 70.3 (*) 78.0 - 100.0 fL   MCH 19.7 (*) 26.0 - 34.0 pg   MCHC 28.0 (*) 30.0 - 36.0 g/dL   RDW 40.9 (*) 81.1 - 91.4 %   Platelets 232  150 - 400 K/uL  COMPREHENSIVE METABOLIC PANEL      Component Value Range   Sodium 140  135 - 145 mEq/L   Potassium 4.2  3.5 - 5.1 mEq/L   Chloride 106  96 - 112 mEq/L   CO2 20  19 - 32 mEq/L   Glucose, Bld 169 (*) 70 - 99 mg/dL   BUN 28 (*) 6 - 23 mg/dL   Creatinine, Ser 7.82 (*) 0.50  - 1.10 mg/dL   Calcium 9.1  8.4 - 95.6 mg/dL   Total Protein 7.1  6.0 - 8.3 g/dL   Albumin 3.2 (*) 3.5 - 5.2  g/dL   AST 74 (*) 0 - 37 U/L   ALT 61 (*) 0 - 35 U/L   Alkaline Phosphatase 84  39 - 117 U/L   Total Bilirubin 0.4  0.3 - 1.2 mg/dL   GFR calc non Af Amer 42 (*) >90 mL/min   GFR calc Af Amer 48 (*) >90 mL/min  D-DIMER, QUANTITATIVE      Component Value Range   D-Dimer, Quant 14.25 (*) 0.00 - 0.48 ug/mL-FEU  PRO B NATRIURETIC PEPTIDE      Component Value Range   Pro B Natriuretic peptide (BNP) 7255.0 (*) 0 - 450 pg/mL  TROPONIN I      Component Value Range   Troponin I 0.81 (*) <0.30 ng/mL  PROTIME-INR      Component Value Range   Prothrombin Time 13.6  11.6 - 15.2 seconds   INR 1.02  0.00 - 1.49  RETICULOCYTES      Component Value Range   Retic Ct Pct 1.6  0.4 - 3.1 %   RBC. 2.85 (*) 3.87 - 5.11 MIL/uL   Retic Count, Manual 45.6  19.0 - 186.0 K/uL   Dg Chest Port 1 View  07/12/2012  *RADIOLOGY REPORT*  Clinical Data: Shortness of breath.  PORTABLE CHEST - 1 VIEW  Comparison: 09/09/2010  Findings: There are patchy interstitial densities throughout both lungs.  There may be airspace disease in the left mid lung. Findings concerning for pulmonary edema.  Increased lucency in the periphery of the left hemithorax may represent hyperinflation although the lucency appears to be lateral to the ribs.  Heart size is upper limits of normal.  IMPRESSION: Prominent interstitial lung markings with prominent perihilar structures.  Findings are suggestive for pulmonary edema.  Unusual lucency along the lateral left hemithorax as described.   Original Report Authenticated By: Richarda Overlie, M.D.       MDM  Iv ns.  Portable cxr. Labs.  Reviewed nursing notes and prior charts for additional history.   Asa po.   Date: 07/12/2012  Rate: 130  Rhythm: sinus tachycardia  QRS Axis: normal  Intervals: normal  ST/T Wave abnormalities: ST depressions inferiorly and ST depressions laterally   Conduction Disutrbances:none  Narrative Interpretation:   Old EKG Reviewed: changes noted   Pt tachycardic, on labs very low hemoglobin. Family notes remote hx transfusion. States recent appetite good, no nv. Pt unaware of change in stools or blood, or bloody bms.  Stool heme positive.  tranfuse 2 units prbc. protonix iv.   Med service called for admission.  CRITICAL CARE Performed by: Suzi Roots   Total critical care time: 40  Critical care time was exclusive of separately billable procedures and treating other patients.  Critical care was necessary to treat or prevent imminent or life-threatening deterioration.  Critical care was time spent personally by me on the following activities: development of treatment plan with patient and/or surrogate as well as nursing, discussions with consultants, evaluation of patient's response to treatment, examination of patient, obtaining history from patient or surrogate, ordering and performing treatments and interventions, ordering and review of laboratory studies, ordering and review of radiographic studies, pulse oximetry and re-evaluation of patient's condition.   Trop elev. However given hgb 5 and heme positive stool, will hold heparin. Recheck pt, no current cp.       Suzi Roots, MD 07/12/12 [phone]

## 2012-07-12 NOTE — ED Notes (Signed)
Pt from home where she lives with daughter and son in law.  Pt's daughter was at church, son in law only knew pt's name, pt not a good historian.  Pt c/o SOB.  100% on 2L [place].

## 2012-07-12 NOTE — H&P (Signed)
Hospital Admission Note Date: 07/12/2012  Patient name: Katherine [name] Medical record number: [id number] Date of birth: Jun 14, 1923 Age: 78 y.o. Gender: female PCP: Katherine Mage, MD  Medical Service: Internal Medicine Teaching Service--[facility]  Attending physician: Katherine [name]    1st Contact: Dr. Cicero [name] 2nd Contact: Dr. Coralyn [name]   [id number] After 5 pm or weekends: 1st Contact:      Pager: [phone] 2nd Contact:      Pager: [phone]  Chief Complaint: "Couldn't catch her breath"  History of Present Illness: Ms. 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  Ms. Katherine [name]'s daughter claims she was at church this morning when her husband called her and said Ms. Katherine [name] was having difficulty breathing and he decided to call the ambulance.  Ms. Katherine [name] has a hx of dementia but as per her daughter, she seems more confused lately.  She is oriented to person and place, but not to time.  Ms. Katherine [name] daughter reports her to have a nonproductive cough, headaches, weakness, less active x3 weeks, and reports complaints of of intermittent chest pain x1 month with most recent chest pain 3 days ago relieved with Nitro.  She reports her to have a good appetite and denies her to have any nausea, vomiting, hematemesis, diarrhea, constipation, dysuria, abdominal pain, or bleeding per rectum.   In the ED, [name] was found to have Hb of 5.3, heme positive stool, and ProBNP=7255 with troponin of 0.81 and EKG significant for ST depression mainly in leads II, III, avf, V5 and V6.  She was noted to wet the bed as per daughter which she has apparently never done before.    Of note, prior records from October 2011 report similar admission with complaints of chest pain, shortness of  breath, weakness, and Hb of 5.2.  EGD (09/10/12) done during that visit revealed GAVE in antrum, hiatal hernia, and a benign ring-like large caliber stricture in distal esophagus  He also had an EGD and colonoscopy in September 2010 significant for chronic gastritis, negative H pylori, and severe diverticulosis of colon.  During that visit, she was transfused with 4 units of PRBCs.  Ms. Katherine [name] is followed by Dr. Eben [name] in [facility].     Meds: Current Outpatient Rx  Name Route Sig Dispense Refill  . ACETAMINOPHEN 500 MG PO TABS Oral Take 1 tablet (500 mg total) by mouth every 6 (six) hours as needed for pain. 100 tablet 2  . AMLODIPINE BESYLATE 5 MG PO TABS Oral Take 1 tablet (5 mg total) by mouth daily. 30 tablet 3  . CALCIUM PO Oral Take 1 tablet by mouth daily.    . DONEPEZIL HCL 10 MG PO TABS Oral Take 1 tablet (10 mg total) by mouth at bedtime. 30 tablet 3  . LISINOPRIL 5 MG PO TABS Oral Take 1 tablet (5 mg total) by mouth daily. 30 tablet 3  . NITROGLYCERIN 0.4 MG SL SUBL Sublingual Place 0.4 mg under the tongue every 5 (five) minutes as needed. For chest pain    . TRAMADOL HCL 50 MG PO TABS Oral Take 50 mg by mouth every 6 (six) hours as needed. For pain     Allergies: Allergies as of 07/12/2012 - Review Complete 07/12/2012  Allergen Reaction Noted  . Simvastatin  Past Medical History  Diagnosis Date  . Hypertension   . Head ache   . TIA (transient ischemic attack)     Declines aggrenox  . Renal insufficiency     Baseline 1.3  . Depression     Improved with remeron  . Rheumatoid arteritis     Previously on MTX x29yr, failed appropiate f/u  . 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  Marland Kitchen Anemia, macrocytic      most likely secondary to methotrexate use  . Osteoporosis      T score- 3.0 right hip, -2.8 left hip and -2.1 lumbar spine (April 22, 2007). Not on bisphosphonate because of renal insufficiency.   Past Surgical History  Procedure Date  . Appendectomy   . Cholecystectomy   .  lung which rechecks resection left upper lobe with a video thoracoscopy 4 benign granuloma in 2001 2001   Family History  Problem Relation Age of Onset  . Cancer Sister     Died  . Diabetes Brother   . Coronary artery disease Mother     died age 45  . Diabetes Daughter     died age 54   History   Social History  . Marital Status: Widowed    Spouse Name: N/A    Number of Children: N/A  . Years of Education: N/A   Occupational History  . Not on file.   Social History Main Topics  . Smoking status: Former Games developer  . Smokeless tobacco: Not on file  . Alcohol Use: No  . Drug Use: Not on file  . Sexually Active: Not on file   Other Topics Concern  . Not on file   Social History Narrative   Red bag given 03/14/2009.Recently moved in with her daughter, she states because of problems with her neighbors and because her son moved out.Has 2 daughters but 1 is deceased from DM at age 15.   Review of Systems: Review of Systems:  Constitutional:  Denies fever, chills, diaphoresis, appetite change and fatigue.   HEENT:  Denies congestion, sore throat, rhinorrhea, sneezing, mouth sores, trouble swallowing, neck pain   Respiratory:  + SOB, cough.  Denies DOE and wheezing.   Cardiovascular:  + Chest pain x3 days ago.  Denies palpitations and leg swelling.   Gastrointestinal:  + blood in stool.  Denies nausea, vomiting, abdominal pain, diarrhea, constipation, and abdominal distention.   Genitourinary:  Denies dysuria, urgency, frequency, hematuria, flank pain and difficulty urinating.   Musculoskeletal:  Denies myalgias, back pain, joint swelling, arthralgias and gait problem.   Skin:  Denies pallor, rash and wound.   Neurological:  + weakness.  Denies dizziness, seizures, syncope, light-headedness, numbness and headaches.    Physical  Exam: Blood pressure 128/66, pulse 119, temperature 98.5 F (36.9 C), SpO2 100.00%. Vitals reviewed. General: resting in bed, NAD, occasionally laughing, on [place] 2L O2 HEENT: PERRLA, EOMI,  Cardiac: Tachycardia, no rubs, murmurs or gallops Pulm: clear to auscultation bilaterally, no wheezes, rales, or rhonchi Abd: soft, nontender, nondistended, +BS present, + periumbilical scar with folded skin Ext: warm and well perfused, no pedal edema, +2dp b/l Neuro: alert and oriented X2 to person and place, not to time, cranial nerves II-XII grossly intact, strength and sensation to light touch equal in bilateral lower extremities with LUE>RUE.   Lab  results: Basic Metabolic Panel:  Basename 07/12/12 1301  NA 140  K 4.2  CL 106  CO2 20  GLUCOSE 169*  BUN 28*  CREATININE 1.15*  CALCIUM 9.1  MG --  PHOS --   Liver Function Tests:  Basename 07/12/12 1301  AST 74*  ALT 61*  ALKPHOS 84  BILITOT 0.4  PROT 7.1  ALBUMIN 3.2*   CBC:  Basename 07/12/12 1301  WBC 10.9*  NEUTROABS --  HGB 5.3*  HCT 18.9*  MCV 70.3*  PLT 232   Cardiac Enzymes:  Basename 07/12/12 1302  CKTOTAL --  CKMB --  CKMBINDEX --  TROPONINI 0.81*   BNP:  Basename 07/12/12 1302  PROBNP 7255.0*   D-Dimer:  Basename 07/12/12 1301  DDIMER 14.25*   Anemia Panel:  Basename 07/12/12 1404  VITAMINB12 --  FOLATE --  FERRITIN --  TIBC --  IRON --  RETICCTPCT 1.6   Coagulation:  Basename 07/12/12 1301  LABPROT 13.6  INR 1.02   Urine Drug Screen: Drugs of Abuse     Component Value Date/Time   LABOPIA NONE DETECTED 11/22/2007 1900   COCAINSCRNUR NONE DETECTED 11/22/2007 1900   LABBENZ NONE DETECTED 11/22/2007 1900   AMPHETMU NONE DETECTED 11/22/2007 1900   THCU NONE DETECTED 11/22/2007 1900   LABBARB  Value: NONE DETECTED        DRUG SCREEN FOR MEDICAL PURPOSES ONLY.  IF CONFIRMATION IS NEEDED FOR ANY PURPOSE, NOTIFY LAB WITHIN 5 DAYS. 11/22/2007 1900    Imaging results:  Dg Chest Port 1  View  07/12/2012  *RADIOLOGY REPORT*  Clinical Data: Shortness of breath.  PORTABLE CHEST - 1 VIEW  Comparison: 09/09/2010  Findings: There are patchy interstitial densities throughout both lungs.  There may be airspace disease in the left mid lung. Findings concerning for pulmonary edema.  Increased lucency in the periphery of the left hemithorax may represent hyperinflation although the lucency appears to be lateral to the ribs.  Heart size is upper limits of normal.  IMPRESSION: Prominent interstitial lung markings with prominent perihilar structures.  Findings are suggestive for pulmonary edema.  Unusual lucency along the lateral left hemithorax as described.   Original Report Authenticated By: Richarda Overlie, M.D.    Other results: EKG: 106bpm sinus tachycardia with ST depressions in II, III, avF, V5, V6.     Assessment & Plan by Problem: 78 year old woman with past medical history significant for GAVE (Gastric Antral Vascular Ectasia) disease in October 2011, HTN, and dementia, comes to the ER with chief complaint of shortness of breath x1 day.  # GI bleed: Presents with complaints of new onset shortness of breath. She is not a great historian with her dementia -the history was obtained by daughter.  On exam, she was found to have heme positive stools and hemoglobin was 5.3 . Given her history of GAVE disease on endoscopy in 10/11, this is most likely recurrence of GAVE disease. Other possibility includes AVM's vs erosive gastritis vs PUD vs dileuafoy's lesion vs. malignancy - Admit to step down bed.  - CBC q8. - Transfuse 2 units of PRBC now, recheck h/h post transfusion, consider transfusing again. - Hold BP meds. - GI consulted - Protonix   # NSTEMI type 2: likely  2/2 demand ischemia from her anemia in the setting of GI bleed.  Daughter states she has been reporting on and off chest pain for last 1 month, 3 days ago cp relieved with nitro . She became SOB this AM when EMS was  called.  Her POC  troponin was elevated to 0.81 and her D- dimer was elevated. Her EKG shows ST depression in leads II, III, avF, V4, V5 and V6.  - Continue to monitor for now.  - Cycle CE - PRN NTG ( Hold if SBP <19mm Hg) - repeat EKG in AM  #Tachycardia: likely 2/2 anemia causing high output state. Her D- dimer is elevated but this could be likely 2/2 to her volume overload/congestion with anemia. PE is low probability with her WELL's score of  2 (given her history of breast cancer and tachycardia) and modified Geneva score of 6. Her elevated D- dimer could be due to age, history of malignancy, and RA. She is not a candidate for anticoagulation at this time.  - Monitor vitals for now.  # Elevated Pro- BNP : ?could be 2/2 anemia causing some vascular congestion. She was found to have increased interstial markings on her CXR. Her last echo in 2009 showed EF of 70% with no diastolic dysfunction. Anemia causes activation of the baroreceptors (as it does in low-output CHF), which, in turn, activates the sympathetic, renin-angiotensin-aldosterone system and vasopressin systems, causing tachycardia and peripheral vasoconstriction and a decrease in renal blood flow and glomerular filtration rate. This leads to salt and water retention and an increase in extracellular and plasma volume. - [id number] - Lasix with transfusion if BP allows. - Obtain 2D echo   # Dementia: worse than baseline as reported by daughter likely in the setting of acute illness. _ continue to monitor mental status  # HTN: BP stable. -Hold BP meds in the setting of GI bleed.  # Hx of breast cancer; high-grade intraductal carcinoma, status post lumpectomy and radiation therapy in February 2000.  # Hx of RA: stable.  DVT: SCD's Diet: NPO after midnight Dispo: pending clinical improvement, GI consultation  Signed: Darden Palmer 07/12/2012, 2:59 PM

## 2012-07-13 ENCOUNTER — Encounter (HOSPITAL_COMMUNITY): Payer: Self-pay | Admitting: Cardiology

## 2012-07-13 ENCOUNTER — Telehealth: Payer: Self-pay | Admitting: Internal Medicine

## 2012-07-13 DIAGNOSIS — I214 Non-ST elevation (NSTEMI) myocardial infarction: Secondary | ICD-10-CM

## 2012-07-13 DIAGNOSIS — K31819 Angiodysplasia of stomach and duodenum without bleeding: Secondary | ICD-10-CM | POA: Diagnosis present

## 2012-07-13 DIAGNOSIS — D500 Iron deficiency anemia secondary to blood loss (chronic): Secondary | ICD-10-CM | POA: Diagnosis present

## 2012-07-13 LAB — CARDIAC PANEL(CRET KIN+CKTOT+MB+TROPI)
Relative Index: 5.1 — ABNORMAL HIGH (ref 0.0–2.5)
Relative Index: 3.2 — ABNORMAL HIGH (ref 0.0–2.5)
Total CK: 325 U/L — ABNORMAL HIGH (ref 7–177)
Total CK: 358 U/L — ABNORMAL HIGH (ref 7–177)
Troponin I: 3.93 ng/mL (ref <0.30)

## 2012-07-13 LAB — COMPREHENSIVE METABOLIC PANEL
ALT: 50 U/L — ABNORMAL HIGH (ref 0–35)
AST: 75 U/L — ABNORMAL HIGH (ref 0–37)
Albumin: 2.8 g/dL — ABNORMAL LOW (ref 3.5–5.2)
CO2: 20 mEq/L (ref 19–32)
Calcium: 8.8 mg/dL (ref 8.4–10.5)
Chloride: 109 mEq/L (ref 96–112)
GFR calc Af Amer: 43 mL/min — ABNORMAL LOW (ref >90)
Glucose, Bld: 112 mg/dL — ABNORMAL HIGH (ref 70–99)
Potassium: 4.2 mEq/L (ref 3.5–5.1)
Sodium: 140 mEq/L (ref 135–145)
Total Protein: 6.3 g/dL (ref 6.0–8.3)

## 2012-07-13 LAB — CBC
HCT: 22.7 % — ABNORMAL LOW (ref 36.0–46.0)
HCT: 32.4 % — ABNORMAL LOW (ref 36.0–46.0)
Hemoglobin: 6.8 g/dL — CL (ref 12.0–15.0)
Hemoglobin: 10.2 g/dL — ABNORMAL LOW (ref 12.0–15.0)
MCH: 22.1 pg — ABNORMAL LOW (ref 26.0–34.0)
RBC: 3.08 MIL/uL — ABNORMAL LOW (ref 3.87–5.11)
RDW: 19.5 % — ABNORMAL HIGH (ref 11.5–15.5)
WBC: 10.3 10*3/uL (ref 4.0–10.5)

## 2012-07-13 LAB — HEMOGLOBIN A1C
Hgb A1c MFr Bld: 5.7 % — ABNORMAL HIGH (ref <5.7)
Mean Plasma Glucose: 117 mg/dL — ABNORMAL HIGH (ref <117)

## 2012-07-13 LAB — PREPARE RBC (CROSSMATCH)

## 2012-07-13 MED ORDER — HALOPERIDOL LACTATE 5 MG/ML IJ SOLN
2.0000 mg | Freq: Once | INTRAMUSCULAR | Status: AC
  Administered 2012-07-13: 2 mg via INTRAVENOUS

## 2012-07-13 MED ORDER — SODIUM CHLORIDE 0.9 % IJ SOLN
INTRAMUSCULAR | Status: AC
  Administered 2012-07-13: 10 mL
  Filled 2012-07-13: qty 10

## 2012-07-13 MED ORDER — METOPROLOL TARTRATE 12.5 MG HALF TABLET
12.5000 mg | ORAL_TABLET | Freq: Two times a day (BID) | ORAL | Status: DC
  Administered 2012-07-13 – 2012-07-16 (×7): 12.5 mg via ORAL
  Filled 2012-07-13 (×9): qty 1

## 2012-07-13 MED ORDER — HALOPERIDOL LACTATE 5 MG/ML IJ SOLN
2.0000 mg | Freq: Once | INTRAMUSCULAR | Status: AC
  Administered 2012-07-13: 2 mg via INTRAVENOUS
  Filled 2012-07-13: qty 1

## 2012-07-13 MED ORDER — HALOPERIDOL LACTATE 5 MG/ML IJ SOLN
INTRAMUSCULAR | Status: AC
  Administered 2012-07-13: 2 mg via INTRAVENOUS
  Filled 2012-07-13: qty 1

## 2012-07-13 MED ORDER — PANTOPRAZOLE SODIUM 40 MG PO TBEC
40.0000 mg | DELAYED_RELEASE_TABLET | Freq: Every day | ORAL | Status: DC
  Administered 2012-07-14 – 2012-07-16 (×3): 40 mg via ORAL
  Filled 2012-07-13 (×3): qty 1

## 2012-07-13 MED ORDER — WHITE PETROLATUM GEL
Status: AC
  Administered 2012-07-13: 14:00:00
  Filled 2012-07-13: qty 5

## 2012-07-13 NOTE — Telephone Encounter (Signed)
The number has a recording that says you have reached a number that has been disconnected.

## 2012-07-13 NOTE — Progress Notes (Signed)
Subjective: Katherine [name] was seen and examined at bedside.  She is in soft mittens by nursing because she was trying to pull out her IV and monitor leads.  Katherine [name] is oriented to place and person and is in and out of sleep.  She has been tolerating diet and reports no major complaints at this time.  She has been evaluated by GI who may take her for EGD tomorrow and has been transfused a total of 4 units of PRBCs since admission.  Cardiology also evaluated her--ruled in for an infarct however, in setting of significant anemia and GIB not a candidate for anticoagulation at this time.  She denies any headaches, nausea, vomiting, diarrhea, abdominal pain, chest pain, or shortness of breath.  Objective: Vital signs in last 24 hours: Filed Vitals:   07/13/12 1330 07/13/12 1430 07/13/12 1530 07/13/12 1605  BP: 134/77 105/78 130/77 148/83  Pulse: 79 71 80 102  Temp: 98.1 F (36.7 C) 98.7 F (37.1 C) 98.1 F (36.7 C) 98.2 F (36.8 C)  TempSrc: Oral Oral Axillary Axillary  Resp: 26 23 28  33  Height:      Weight:      SpO2:       Weight change:   Intake/Output Summary (Last 24 hours) at 07/13/12 1635 Last data filed at 07/13/12 1605  Gross per 24 hour  Intake 1477.5 ml  Output    751 ml  Net  726.5 ml   Physical Exam:  Blood pressure 128/66, pulse 119, temperature 98.5 F (36.9 C), SpO2 100.00%.  Vitals reviewed.  General: resting in bed, NAD, occasionally laughing, on [place] O2  HEENT: PERRLA, EOMI  Cardiac: Tachycardia, no rubs, murmurs or gallops  Pulm: clear to auscultation bilaterally, no wheezes, rales, or rhonchi  Abd: soft, nontender, nondistended, +BS present, + periumbilical scar with folded skin  Ext: warm and well perfused, no pedal edema, +2dp b/l, mittens on b/l hands Neuro: alert and oriented X2 to person and place, not to time, cranial nerves II-XII grossly intact, strength and sensation to light touch equal in bilateral lower extremities with LUE>RUE.   Lab Results: Basic  Metabolic Panel:  Lab 07/13/12 1610 07/12/12 1301  NA 140 140  K 4.2 4.2  CL 109 106  CO2 20 20  GLUCOSE 112* 169*  BUN 27* 28*  CREATININE 1.26* 1.15*  CALCIUM 8.8 9.1  MG -- --  PHOS -- --   Liver Function Tests:  Lab 07/13/12 0445 07/12/12 1301  AST 75* 74*  ALT 50* 61*  ALKPHOS 73 84  BILITOT 1.9* 0.4  PROT 6.3 7.1  ALBUMIN 2.8* 3.2*   CBC:  Lab 07/13/12 0418 07/12/12 1301  WBC 7.8 10.9*  NEUTROABS -- --  HGB 6.8* 5.3*  HCT 22.7* 18.9*  MCV 73.7* 70.3*  PLT 172 232   Cardiac Enzymes:  Lab 07/13/12 0418 07/12/12 1302  CKTOTAL 325* --  CKMB 16.7* --  CKMBINDEX -- --  TROPONINI 5.46* 0.81*   BNP:  Lab 07/12/12 1302  PROBNP 7255.0*   D-Dimer:  Lab 07/12/12 1301  DDIMER 14.25*   Coagulation:  Lab 07/12/12 1301  LABPROT 13.6  INR 1.02   Anemia Panel:  Lab 07/12/12 1404  VITAMINB12 360  FOLATE >20.0  FERRITIN 4*  TIBC Not calculated due to Iron <10.  IRON <10*  RETICCTPCT 1.6   Urine Drug Screen: Drugs of Abuse     Component Value Date/Time   LABOPIA NONE DETECTED 11/22/2007 1900   COCAINSCRNUR NONE DETECTED 11/22/2007  1900   LABBENZ NONE DETECTED 11/22/2007 1900   AMPHETMU NONE DETECTED 11/22/2007 1900   THCU NONE DETECTED 11/22/2007 1900   LABBARB  Value: NONE DETECTED        DRUG SCREEN FOR MEDICAL PURPOSES ONLY.  IF CONFIRMATION IS NEEDED FOR ANY PURPOSE, NOTIFY LAB WITHIN 5 DAYS. 11/22/2007 1900    Urinalysis:  Lab 07/12/12 1606  COLORURINE YELLOW  LABSPEC 1.020  PHURINE 5.0  GLUCOSEU NEGATIVE  HGBUR NEGATIVE  BILIRUBINUR NEGATIVE  KETONESUR NEGATIVE  PROTEINUR 30*  UROBILINOGEN 1.0  NITRITE NEGATIVE  LEUKOCYTESUR NEGATIVE   Micro Results: Recent Results (from the past 240 hour(s))  MRSA PCR SCREENING     Status: Normal   Collection Time   07/12/12  5:42 PM      Component Value Range Status Comment   MRSA by PCR NEGATIVE  NEGATIVE Final    Studies/Results: Dg Chest Port 1 View  07/12/2012  *RADIOLOGY REPORT*  Clinical Data:  Shortness of breath.  PORTABLE CHEST - 1 VIEW  Comparison: 09/09/2010  Findings: There are patchy interstitial densities throughout both lungs.  There may be airspace disease in the left mid lung. Findings concerning for pulmonary edema.  Increased lucency in the periphery of the left hemithorax may represent hyperinflation although the lucency appears to be lateral to the ribs.  Heart size is upper limits of normal.  IMPRESSION: Prominent interstitial lung markings with prominent perihilar structures.  Findings are suggestive for pulmonary edema.  Unusual lucency along the lateral left hemithorax as described.   Original Report Authenticated By: Richarda Overlie, M.D.    [facility]* *[facility]* [address] [place], [place] [address] [phone]  ------------------------------------------------------------ Transthoracic Echocardiography  Patient: Katherine [name], Katherine [name] MR #: [id number] Study Date: 07/13/2012 Gender: F Age: 78 Height: 167.6cm Weight: 60.8kg BSA: 1.23m^2 Pt. Status: Room: 3306  PERFORMING [facility] ADMITTING Blanch Media ATTENDING Lorenso Quarry, [facility] Blanch Media SONOGRAPHER [place], RDCS cc:  ------------------------------------------------------------ LV EF: 55% - 60%  ------------------------------------------------------------ Indications: MI - acute 410.91.  ------------------------------------------------------------ History: PMH: Transient ischemic attack. Risk factors: GI bleed. Vitamin B12 deficiency. Anemia. First degree heart block. Renal insufficiency. Left breast mass. Rheumatoid arthritis. Osteoporosis. Memory loss. Dementia. Former tobacco use. Hypertension. Dyslipidemia.  ------------------------------------------------------------ Study Conclusions  - Left ventricle: The cavity size was normal. There was moderate concentric hypertrophy. Systolic function  was normal. The estimated ejection fraction was in the range of 55% to 60%. Possible hypokinesis of the basalinferior myocardium. Doppler parameters are consistent with abnormal left ventricular relaxation (grade 1 diastolic dysfunction). - Aortic valve: Mild to moderate regurgitation. - Mitral valve: Calcified annulus. Mildly thickened leaflets . Mild to moderate regurgitation. - Pulmonary arteries: PA peak pressure: 67mm Hg (S). Transthoracic echocardiography. M-mode, complete 2D, spectral Doppler, and color Doppler. Height: Height: 167.6cm. Height: 66in. Weight: Weight: 60.8kg. Weight: 133.7lb. Body mass index: BMI: 21.6kg/m^2. Body surface area: BSA: 1.24m^2. Blood pressure: 120/61. Patient status: Inpatient. Location: ICU/CCU  ------------------------------------------------------------  ------------------------------------------------------------ Left ventricle: The cavity size was normal. There was moderate concentric hypertrophy. Systolic function was normal. The estimated ejection fraction was in the range of 55% to 60%. Regional wall motion abnormalities: Possible hypokinesis of the basalinferior myocardium. Doppler parameters are consistent with abnormal left ventricular relaxation (grade 1 diastolic dysfunction).  ------------------------------------------------------------ Aortic valve: Mildly thickened, mildly calcified leaflets. Cusp separation was normal. Doppler: Transvalvular velocity was within the normal range. There was no stenosis. Mild to moderate regurgitation.  ------------------------------------------------------------ Aorta: The aorta  was normal, not dilated, and non-diseased.  ------------------------------------------------------------ Mitral valve: Calcified annulus. Mildly thickened leaflets . Leaflet separation was normal. Doppler: Transvalvular velocity was within the normal range. There was no evidence for stenosis. Mild to moderate  regurgitation.  ------------------------------------------------------------ Left atrium: The atrium was normal in size.  ------------------------------------------------------------ Right ventricle: The cavity size was normal. Wall thickness was normal. Systolic function was normal.  ------------------------------------------------------------ Pulmonic valve: Structurally normal valve. Cusp separation was normal. Doppler: Transvalvular velocity was within the normal range. Trivial regurgitation.  ------------------------------------------------------------ Tricuspid valve: Structurally normal valve. Leaflet separation was normal. Doppler: Transvalvular velocity was within the normal range. Mild regurgitation.  ------------------------------------------------------------ Right atrium: The atrium was normal in size.  ------------------------------------------------------------ Pericardium: The pericardium was normal in appearance. There was no pericardial effusion.  ------------------------------------------------------------ Systemic veins: Inferior vena cava: The vessel was dilated; the respirophasic diameter changes were blunted (< 50%); findings are consistent with elevated central venous pressure.  ------------------------------------------------------------ Post procedure conclusions Ascending Aorta:  - The aorta was normal, not dilated, and non-diseased.  ------------------------------------------------------------  2D measurements Normal Doppler measurements Normal Left ventricle Main pulmonary LVID ED, 40.7 mm Katherine-52 artery chord, Pressure, S 67 mm =30 PLAX Hg LVID ES, 35.1 mm 23-38 Aortic valve chord, Regurg PHT 275 ms ------ PLAX Mitral valve FS, chord, 14 % >29 Peak E vel 55. cm/s ------ PLAX 3 LVPW, ED 13.2 mm ------ Peak A vel 99. cm/s ------ IVS/LVPW 1.25 <1.3 2 ratio, ED Deceleration 225 ms 150-23 Ventricular septum time 0 IVS, ED 16.5 mm ------  Peak E/A 0.6 ------ Aorta ratio Root diam, 34 mm ------ Tricuspid valve ED Regurg peak 341 cm/s ------ Left atrium vel AP dim 29 mm ------ Peak RV-RA 47 mm ------ AP dim 1.72 cm/m^2 <2.2 gradient, S Hg index Systemic veins Vol, S 90 ml ------ Estimated CVP 20 mm ------ Vol index, 53.3 ml/m^2 ------ Hg S Right ventricle Pressure, S 67 mm <30 Hg Sa vel, lat 11 cm/s ------ ann, tiss DP  ------------------------------------------------------------ Prepared and Electronically Authenticated by  Donato Schultz 2013-08-26T13:19:53.803  Medications: I have reviewed the patient's current medications. Scheduled Meds:    . donepezil  10 mg Oral QHS  . furosemide  20 mg Intravenous Once  . haloperidol lactate  2 mg Intravenous Once  . haloperidol lactate  2 mg Intravenous Once  . metoprolol tartrate  12.5 mg Oral BID  . pantoprazole  40 mg Oral Q0600  . sodium chloride  3 mL Intravenous Q12H  . sodium chloride      . white petrolatum      . DISCONTD: pantoprazole (PROTONIX) IV  40 mg Intravenous Q12H   Continuous Infusions:    . 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  # GI bleed: Presents with complaints of new onset shortness of breath. She is not a great historian with her dementia -the history was obtained by daughter. On exam, she was found to have heme positive stools and hemoglobin was 5.3 . Given her history of GAVE disease on endoscopy in 10/11, this is most likely recurrence of GAVE disease. Other possibility includes AVM's vs erosive gastritis vs PUD vs dileuafoy's lesion vs. malignancy  - CBC  q8.  -  Transfused 2 units of PRBC yesterday, h/h 4am: 6.8/22.7, will transfuse 2 units again today; recheck cbc post transfusion, Cardio recommends Hb to be transfused to 10 if possible  - Hold BP meds.  - GI consulted--Dr. Leone Payor thinks EGD is optional given known hx and chronic GIB loss, will discuss with daughter and decide in setting of multiple comorbidities and risk of complications.   -IV iron supplementation x1 with chronic po iron - Protonix   # NSTEMI type 2: likely 2/2 demand ischemia from her anemia in the setting of GI bleed.  Daughter states she has been reporting on and off chest pain for last 1 month, 3 days ago cp relieved with nitro . She became SOB this AM when EMS was called. Her POC troponin was elevated to 0.81 and her D- dimer was elevated. Her EKG on admission showed ST depression in leads II, III, avF, V4, V5 and V6.  - Continue to monitor for now.  - Cycle CE--Troponin increased to 5.46 at 4am this morning - Cardiology following: NSTEMI--ruled in for infarct, but conservative therapy in setting of significant anemia 2/2 GIB, hold all anticoagulation at this time, no ASA, heparin, or plavix, not a candidate for invasive cardiac evaluation.  Add low dose metoprolol but follow for bradycardia.  - f/u echo--EF 55-60%. Moderate concentric hypertrophy, grade 1 diastolic dysfunction, mild to moderate AR, calcified mitral valve with mild to moderate regurge, PA peak pressure .  - PRN NTG ( Hold if SBP <43mm Hg)  - do not start statin at this time - repeat EKG in AM--ST depressions resolved,    #Tachycardia: likely 2/2 anemia causing high output state. Her D- dimer is elevated but this could be likely 2/2 to her volume overload/congestion with anemia. PE is low probability with her WELL's score of 2 (given her history of breast cancer and tachycardia) and modified Geneva score of 6. Her elevated D- dimer could be due to age, history of malignancy, and RA. She is not a  candidate for anticoagulation at this time.  - Monitor vitals for now.   # Elevated Pro- BNP : ?could be 2/2 anemia causing some vascular congestion. She was found to have increased interstial markings on her CXR. Her last echo in 2009 showed EF of 70% with no diastolic dysfunction. Anemia causes activation of the baroreceptors (as it does in low-output CHF), which, in turn, activates the sympathetic, renin-angiotensin-aldosterone system and vasopressin systems, causing tachycardia and peripheral vasoconstriction and a decrease in renal blood flow and glomerular filtration rate. This leads to salt and water retention and an increase in extracellular and plasma volume.  - [id number]  - Lasix with transfusion yesterday -  2D echo--grade 1 diastolic dysfunction, EF 55-60%   # Dementia: worse than baseline as reported by daughter likely in the setting of acute illness.  - continue to monitor mental status  - Ms. Oelke has been more agitated this afternoon as per nursing staff and wanting to go home.  We will need to discuss her management further with daughter when she arrives, phone number on file is disconnected.    # HTN: BP stable.  -Hold BP meds in the setting of GI bleed.   # Hx of breast cancer; high-grade intraductal carcinoma, status post lumpectomy and radiation therapy in February 2000.  -stable  # Hx of RA: stable.  -does not meet criteria for bisphosphonates with CrCl:29  DVT: SCD's  Diet: NPO after midnight  Dispo: pending clinical improvement, GI consultation  LOS: 1 day   Darden Palmer 07/13/2012, 4:35 PM

## 2012-07-13 NOTE — H&P (Signed)
Internal Medicine Teaching Service Attending Note Date: 07/13/2012  Patient name: Katherine [name]  Medical record number: [id number]  Date of birth: 17-Aug-1923   I have seen and evaluated Katherine [name] and discussed their care with the Residency Team. Please see Dr Waynard Reeds H&P for full details. [name] is an 78 yo female with dementia so the hx was obtained from her daughter. The daughter was not present today when I visited the pt. [name] has had a cough for 3 weeks and was less active. She has had three days of CP hat got better with NTG. 911 was called when the patient had difficulty breathing.  In the ER, Ms. Katherine [name] was found to have a hemoglobin of 5.3 and heme positive stools. Her last HgB was in Feb 2012 in was 11.9. She also had ST segment depression in the inferior lateral leads. Her Trop I has increased to max of 5.46 although her ST depression has resolved with PRBC transfusion.   Currently, [name] has a sitter due to increased confusion. She did not sleep at all overnight nor today. She pulled out 2 peripheral IVs last night. She had mittens on until lunchtime when they were removed.  The patient had a very similar admission in October 2011 when she was found to have a hemoglobin of 5.2. She had an EGD during that admission that showed GAVE.  PMHX sig for 1. GAVE  Baseline HgB somewhere btw 10 and 12  Anemia panel c/w Fe def 2. Demetia 3. H/O TIA's 4. CRI   Baseline creatinine about 1.3 5. H/O breast cancer 6. HTN 7. Osteoporosis  Meds, allergies, soc hx, and family hx were reviewed  Filed Vitals:   07/13/12 1120 07/13/12 1207 07/13/12 1230 07/13/12 1330  BP: 120/63 121/60 121/68 134/77  Pulse: 90 87 80 79  Temp: 98.8 F (37.1 C) 99.1 F (37.3 C) 98 F (36.7 C) 98.1 F (36.7 C)  TempSrc: Oral Oral Oral Oral  Resp: 30 24 24 26   Height:      Weight:      SpO2:  99% 99%    Gen : alert, sitting in bed, NAD. Had just gotten chocked on  peaches. HRRR LCTAB anteriorly ABD + BS, c/o pain with palpation. Ext : has ICD's on. No edema Neuro : no focal  Labs and imaging reviewed.   Assessment and Plan: I agree with the formulated Assessment and Plan with the following changes:   1. Acute on chronic anemia 2/2 GI loss likely gastric blood loss - With two units PBRCs, her HgB has increased to 6.8 and she is currently receiving additional units. Due to her NSTEMI, her HgB goal is 10. GI might consider an EGD tomorrow. Currently on PPI but if the etiology of rebleeding is the ectasia's, then long term PPI not needed.   2. NSTEMI - she is not a candidate for anti-coag or cath. ECHO has been ordered to assess LV fxn. BB has been added.  3. Tachycardia - 2/2 anemia which is being treated. BB has also been added.  4. Increased LFT's - this is a new finding. There is an assoc of GAVE with cirrhosis but she has always had nl coags, LFT's, and has had no prior ABD imaging so doubt cirrhosis as an etiology. Could be 2/2 hepatic ischemia? Follow LFT's.  Burns Spain, MD 8/26/20132:00 PM

## 2012-07-13 NOTE — Consult Note (Signed)
HPI: 78 year old female with no prior cardiac history for evaluation of myocardial infarction. Patient has some degree of dementia and is mildly confused at the time of the evaluation. She was admitted on August 25 with complaints of dyspnea and chest pain. She is unable to describe her chest pain. She is pain-free at present. She was found to be severely anemic and is being transfused. Her cardiac markers are elevated and cardiology is asked to evaluate. Note previous GI evaluation showed gastric antral vascular ectasia, hiatal hernia and stricture in the distal esophagus. Previous colonoscopy showed severe diverticulosis. Note admission HGB 5.3.  Medications Prior to Admission  Medication Sig Dispense Refill  . acetaminophen (TYLENOL) 500 MG tablet Take 1 tablet (500 mg total) by mouth every 6 (six) hours as needed for pain.  100 tablet  2  . amLODipine (NORVASC) 5 MG tablet Take 1 tablet (5 mg total) by mouth daily.  30 tablet  3  . CALCIUM PO Take 1 tablet by mouth daily.      Marland Kitchen donepezil (ARICEPT) 10 MG tablet Take 1 tablet (10 mg total) by mouth at bedtime.  30 tablet  3  . lisinopril (PRINIVIL,ZESTRIL) 5 MG tablet Take 1 tablet (5 mg total) by mouth daily.  30 tablet  3  . nitroGLYCERIN (NITROSTAT) 0.4 MG SL tablet Place 0.4 mg under the tongue every 5 (five) minutes as needed. For chest pain      . traMADol (ULTRAM) 50 MG tablet Take 50 mg by mouth every 6 (six) hours as needed. For pain        Allergies  Allergen Reactions  . Simvastatin     REACTION: nightmares    Past Medical History  Diagnosis Date  . Hypertension   . TIA (transient ischemic attack)     Declines aggrenox  . Renal insufficiency     Baseline 1.3  . Depression     Improved with remeron  . Rheumatoid arteritis     Previously on MTX x14yr, failed appropiate f/u  . Breast cancer     Hx of DCIS 12/99,  completed XRT 12/20/1998- 02/02/1999 tamoxifen 20 mg once daily March 2002 to June 2006,  all mammograms since then  have been negative last April 2009.  Marland Kitchen Anemia, macrocytic      most likely secondary to methotrexate use  . Osteoporosis      T score- 3.0 right hip, -2.8 left hip and -2.1 lumbar spine (April 22, 2007). Not on bisphosphonate because of renal insufficiency.  . GI bleed     Past Surgical History  Procedure Date  . Appendectomy   . Cholecystectomy   .  lung which rechecks resection left upper lobe with a video thoracoscopy 4 benign granuloma in 2001 2001    History   Social History  . Marital Status: Widowed    Spouse Name: N/A    Number of Children: N/A  . Years of Education: N/A   Occupational History  . Not on file.   Social History Main Topics  . Smoking status: Former Games developer  . Smokeless tobacco: Current User    Types: Snuff   Comment: large bag/pack lasts 2 days.  . Alcohol Use: No  . Drug Use: Not on file  . Sexually Active: Not on file   Other Topics Concern  . Not on file   Social History Narrative   Red bag given 03/14/2009.Recently moved in with her daughter, she states because of problems with her neighbors and because her  son moved out.Has 2 daughters but 1 is deceased from DM at age 81.    Family History  Problem Relation Age of Onset  . Cancer Sister     Died  . Diabetes Brother   . Coronary artery disease Mother     died age 73  . Diabetes Daughter     died age 81    ROS:  Patient does describe dark stools previously but no fevers or chills, productive cough, hemoptysis, dysphasia, odynophagia, hematochezia, dysuria, hematuria, rash, seizure activity, orthopnea, PND, pedal edema, claudication. Remaining systems are negative.  Physical Exam:   Blood pressure 124/74, pulse 84, temperature 98.6 F (37 C), temperature source Oral, resp. rate 27, height 5\' 6"  (1.676 m), weight 134 lb 0.6 oz (60.8 kg), SpO2 99.00%.  General:  Well developed/frail in NAD Skin warm/dry Patient not depressed No peripheral clubbing Back-normal HEENT-normal/normal  eyelids Neck supple/normal carotid upstroke bilaterally; no bruits; no JVD; no thyromegaly chest - CTA/ normal expansion CV - RRR/normal S1 and S2; no rubs or gallops;  PMI nondisplaced, 2/6 systolic murmur apex. Abdomen -NT/ND, no HSM, no mass, + bowel sounds, no bruit 2+ femoral pulses, no bruits Ext-no edema, chords, 2+ DP Neuro-grossly nonfocal  ECG sinus rhythm with lateral ST depression; first degree AV block.  Results for orders placed during the hospital encounter of 07/12/12 (from the past 48 hour(s))  CBC     Status: Abnormal   Collection Time   07/12/12  1:01 PM      Component Value Range Comment   WBC 10.9 (*) 4.0 - 10.5 K/uL    RBC 2.69 (*) 3.87 - 5.11 MIL/uL    Hemoglobin 5.3 (*) 12.0 - 15.0 g/dL    HCT 16.1 (*) 09.6 - 46.0 %    MCV 70.3 (*) 78.0 - 100.0 fL    MCH 19.7 (*) 26.0 - 34.0 pg    MCHC 28.0 (*) 30.0 - 36.0 g/dL    RDW 04.5 (*) 40.9 - 15.5 %    Platelets 232  150 - 400 K/uL   COMPREHENSIVE METABOLIC PANEL     Status: Abnormal   Collection Time   07/12/12  1:01 PM      Component Value Range Comment   Sodium 140  135 - 145 mEq/L    Potassium 4.2  3.5 - 5.1 mEq/L    Chloride 106  96 - 112 mEq/L    CO2 20  19 - 32 mEq/L    Glucose, Bld 169 (*) 70 - 99 mg/dL    BUN 28 (*) 6 - 23 mg/dL    Creatinine, Ser 8.11 (*) 0.50 - 1.10 mg/dL    Calcium 9.1  8.4 - 91.4 mg/dL    Total Protein 7.1  6.0 - 8.3 g/dL    Albumin 3.2 (*) 3.5 - 5.2 g/dL    AST 74 (*) 0 - 37 U/L    ALT 61 (*) 0 - 35 U/L    Alkaline Phosphatase 84  39 - 117 U/L    Total Bilirubin 0.4  0.3 - 1.2 mg/dL    GFR calc non Af Amer 42 (*) >90 mL/min    GFR calc Af Amer 48 (*) >90 mL/min   D-DIMER, QUANTITATIVE     Status: Abnormal   Collection Time   07/12/12  1:01 PM      Component Value Range Comment   D-Dimer, Quant 14.25 (*) 0.00 - 0.48 ug/mL-FEU   PROTIME-INR     Status: Normal  Collection Time   07/12/12  1:01 PM      Component Value Range Comment   Prothrombin Time 13.6  11.6 - 15.2  seconds    INR 1.02  0.00 - 1.49   PRO B NATRIURETIC PEPTIDE     Status: Abnormal   Collection Time   07/12/12  1:02 PM      Component Value Range Comment   Pro B Natriuretic peptide (BNP) 7255.0 (*) 0 - 450 pg/mL   TROPONIN I     Status: Abnormal   Collection Time   07/12/12  1:02 PM      Component Value Range Comment   Troponin I 0.81 (*) <0.30 ng/mL   TYPE AND SCREEN     Status: Normal (Preliminary result)   Collection Time   07/12/12  2:00 PM      Component Value Range Comment   ABO/RH(D) O POS      Antibody Screen NEG      Sample Expiration 07/15/2012      Unit Number [id number]      Blood Component Type RED CELLS,LR      Unit division 00      Status of Unit ISSUED      Transfusion Status OK TO TRANSFUSE      Crossmatch Result Compatible      Unit Number [id number]      Blood Component Type RED CELLS,LR      Unit division 00      Status of Unit ISSUED      Transfusion Status OK TO TRANSFUSE      Crossmatch Result Compatible      Unit Number [id number]      Blood Component Type RED CELLS,LR      Unit division 00      Status of Unit ALLOCATED      Transfusion Status OK TO TRANSFUSE      Crossmatch Result Compatible      Unit Number [id number]      Blood Component Type RBC LR PHER1      Unit division 00      Status of Unit ISSUED      Transfusion Status OK TO TRANSFUSE      Crossmatch Result Compatible     PREPARE RBC (CROSSMATCH)     Status: Normal   Collection Time   07/12/12  2:00 PM      Component Value Range Comment   Order Confirmation ORDER PROCESSED BY BLOOD BANK     VITAMIN B12     Status: Normal   Collection Time   07/12/12  2:04 PM      Component Value Range Comment   Vitamin B-12 360  211 - 911 pg/mL   FOLATE     Status: Normal   Collection Time   07/12/12  2:04 PM      Component Value Range Comment   Folate >20.0     IRON AND TIBC     Status: Abnormal   Collection Time   07/12/12  2:04 PM      Component Value Range Comment   Iron <10  (*) 42 - 135 ug/dL    TIBC Not calculated due to Iron <10.  250 - 470 ug/dL    Saturation Ratios Not calculated due to Iron <10.  20 - 55 %    UIBC 421 (*) 125 - 400 ug/dL   FERRITIN     Status: Abnormal   Collection Time   07/12/12  2:04 PM  Component Value Range Comment   Ferritin 4 (*) 10 - 291 ng/mL   RETICULOCYTES     Status: Abnormal   Collection Time   07/12/12  2:04 PM      Component Value Range Comment   Retic Ct Pct 1.6  0.4 - 3.1 %    RBC. 2.85 (*) 3.87 - 5.11 MIL/uL    Retic Count, Manual 45.6  19.0 - 186.0 K/uL   URINALYSIS, ROUTINE W REFLEX MICROSCOPIC     Status: Abnormal   Collection Time   07/12/12  4:06 PM      Component Value Range Comment   Color, Urine YELLOW  YELLOW    APPearance CLEAR  CLEAR    Specific Gravity, Urine 1.020  1.005 - 1.030    pH 5.0  5.0 - 8.0    Glucose, UA NEGATIVE  NEGATIVE mg/dL    Hgb urine dipstick NEGATIVE  NEGATIVE    Bilirubin Urine NEGATIVE  NEGATIVE    Ketones, ur NEGATIVE  NEGATIVE mg/dL    Protein, ur 30 (*) NEGATIVE mg/dL    Urobilinogen, UA 1.0  0.0 - 1.0 mg/dL    Nitrite NEGATIVE  NEGATIVE    Leukocytes, UA NEGATIVE  NEGATIVE   URINE MICROSCOPIC-ADD ON     Status: Abnormal   Collection Time   07/12/12  4:06 PM      Component Value Range Comment   Squamous Epithelial / LPF RARE  RARE    Casts HYALINE CASTS (*) NEGATIVE   MRSA PCR SCREENING     Status: Normal   Collection Time   07/12/12  5:42 PM      Component Value Range Comment   MRSA by PCR NEGATIVE  NEGATIVE   CBC     Status: Abnormal   Collection Time   07/13/12  4:18 AM      Component Value Range Comment   WBC 7.8  4.0 - 10.5 K/uL    RBC 3.08 (*) 3.87 - 5.11 MIL/uL    Hemoglobin 6.8 (*) 12.0 - 15.0 g/dL    HCT 57.8 (*) 46.9 - 46.0 %    MCV 73.7 (*) 78.0 - 100.0 fL    MCH 22.1 (*) 26.0 - 34.0 pg    MCHC 30.0  30.0 - 36.0 g/dL    RDW 62.9 (*) 52.8 - 15.5 %    Platelets 172  150 - 400 K/uL DELTA CHECK NOTED  CARDIAC PANEL(CRET KIN+CKTOT+MB+TROPI)      Status: Abnormal   Collection Time   07/13/12  4:18 AM      Component Value Range Comment   Total CK 325 (*) 7 - 177 U/L    CK, MB 16.7 (*) 0.3 - 4.0 ng/mL    Troponin I 5.46 (*) <0.30 ng/mL    Relative Index 5.1 (*) 0.0 - 2.5   COMPREHENSIVE METABOLIC PANEL     Status: Abnormal   Collection Time   07/13/12  4:45 AM      Component Value Range Comment   Sodium 140  135 - 145 mEq/L    Potassium 4.2  3.5 - 5.1 mEq/L    Chloride 109  96 - 112 mEq/L    CO2 20  19 - 32 mEq/L    Glucose, Bld 112 (*) 70 - 99 mg/dL    BUN 27 (*) 6 - 23 mg/dL    Creatinine, Ser 4.13 (*) 0.50 - 1.10 mg/dL    Calcium 8.8  8.4 - 24.4 mg/dL    Total  Protein 6.3  6.0 - 8.3 g/dL    Albumin 2.8 (*) 3.5 - 5.2 g/dL    AST 75 (*) 0 - 37 U/L    ALT 50 (*) 0 - 35 U/L    Alkaline Phosphatase 73  39 - 117 U/L    Total Bilirubin 1.9 (*) 0.3 - 1.2 mg/dL    GFR calc non Af Amer 37 (*) >90 mL/min    GFR calc Af Amer 43 (*) >90 mL/min   PREPARE RBC (CROSSMATCH)     Status: Normal   Collection Time   07/13/12  7:49 AM      Component Value Range Comment   Order Confirmation ORDER PROCESSED BY BLOOD BANK       Dg Chest Port 1 View  07/12/2012  *RADIOLOGY REPORT*  Clinical Data: Shortness of breath.  PORTABLE CHEST - 1 VIEW  Comparison: 09/09/2010  Findings: There are patchy interstitial densities throughout both lungs.  There may be airspace disease in the left mid lung. Findings concerning for pulmonary edema.  Increased lucency in the periphery of the left hemithorax may represent hyperinflation although the lucency appears to be lateral to the ribs.  Heart size is upper limits of normal.  IMPRESSION: Prominent interstitial lung markings with prominent perihilar structures.  Findings are suggestive for pulmonary edema.  Unusual lucency along the lateral left hemithorax as described.   Original Report Authenticated By: Richarda Overlie, M.D.     Assessment/Plan #1-non-ST elevation myocardial infarction-the patient has ruled in for an  infarct. However this is in the setting of significant anemia with an hemoglobin of 5.3. She has active GI blood loss. She is therefore not a candidate for anticoagulation including aspirin, Plavix or heparin. She is therefore not a candidate for invasive cardiac evaluation. Would check echocardiogram for LV function. Add low-dose metoprolol. Note she does have a first degree AV block on her electrocardiogram and we will follow for bradycardia. I would favor conservative therapy given her age, continuing GI blood loss and mild dementia. #2-GI bleed-continue proton next. Transfuse to hemoglobin of 10 if possible. GI evaluation per primary service. Follow hemoglobin. #3-dementia-management per primary service. #4-elevated liver functions-etiology unclear. I will not add a statin at this point for this reason. #5-chronic renal insufficiency-follow renal function. Some of elevation in BUN may be related to GI blood loss.  Olga Millers MD 07/13/2012, 10:19 AM

## 2012-07-13 NOTE — Progress Notes (Signed)
CRITICAL VALUE ALERT  Critical value received:  CK MB 16.7   Date of notification:  07/13/2012  Time of notification:  0557  Critical value read back:yes  Nurse who received alert:  B. Katrinka Blazing  MD notified (1st page):  Internal Medicine Pager  Time of first page:  0557  MD notified (2nd page):   Time of second page:  Responding MD:    Time MD responded:  [phone]

## 2012-07-13 NOTE — Progress Notes (Signed)
Bladder scan indicates pt has of urine in bladder; pt is combative at the time and refuses to void using bedside commode or bedpan; MD notified; orders to perform bladder scan again @ 0700 if no void

## 2012-07-13 NOTE — Progress Notes (Signed)
Utilization review completed.  

## 2012-07-13 NOTE — Progress Notes (Signed)
  Echocardiogram 2D Echocardiogram has been performed.  Katherine [name] 07/13/2012, 12:35 PM 

## 2012-07-13 NOTE — Consult Note (Signed)
[facility] Consult: 12:10 PM 07/13/2012   Referring Provider: Evert Kohl, resident MD  Primary Care Physician:  Lars Mage, MD Primary Gastroenterologist:  Dr. Sheryn Bison   Reason for Consultation:  Microcytic anemia and   HPI: Katherine [name] is a 78 y.o. female.  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  Brought to ED 8/25 with SOB, increase in confusion.  She had FOB positive stool and Hgb was 5.3.  Last known Hgb was 12/2010 measuring 11.9.  She has low MCV at 70.  It had been normal at 93 in 12/2010.  Had been placed on life-long PO Iron after 2011 EGD, but it is not on current outpt med list. Transfused 2 units red cells overnight, but Hgb only went to 6.8. Two more units red cells ordered today.    Pro BNP 7255 and CXR shows CHF. Has ischemic EKG changes and first degree heart block, elevation of Troponin I, ruling her in for non STEMI.   Vague c/o chest pain, pt unable to characterize, has since resolve.  Cardiology does not plan intervention or catheterization, given she is not a candidate for ASA, Plavix etc.   Has had good appetite and denies nausea, vomiting, hematemesis, diarrhea, constipation,  abdominal pain, or bleeding per rectum.   Similar events occurred during Oct 2011 admission when she was diagnosed with GAVE by endoscopy and was started on PO iron.  Received 4 units of blood then for Hgb 5.2.    ROS:   Positive for non-productive cough.  Intermittent chest pain.  For one month.  Relieved with SL NTG No dysuria, no hematuria.  Had incontinence on 8/25, this was a new event.  Baseline dementia is acutely worse but has had declining MS for a year or more.  Family ok with caring for pt at home She is ambulatory, can get to tolilet on her own at home. Has had increasing fatigue and SOB for last month or so.  No falls at home reported.  No nose bleeds or unusual bleeding from nose. No rash or non-healing sores on skin No headaches.       Past Medical History  Diagnosis Date  . Hypertension   . TIA (transient ischemic attack)     Declines aggrenox  . Renal insufficiency     Baseline 1.3  . Depression     Improved with remeron  . Rheumatoid arteritis     Previously on MTX x11yr, failed appropiate f/u  . 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  Marland Kitchen Anemia, macrocytic      most likely secondary to methotrexate use  . Osteoporosis      T score- 3.0 right hip, -2.8 left hip and -2.1 lumbar spine (April 22, 2007). Not on bisphosphonate because of renal insufficiency.  . 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 Dementia   . Diverticulosis of colon 07/21/2009    severe widely distributed tics on colonoscopy    Past Surgical History  Procedure Date  . Appendectomy   . Cholecystectomy   . Lung biopsies. 2011    VATS bx:  benign granuloma.     Prior to Admission medications   Medication Sig Start Date End Date Taking?  Authorizing Provider  acetaminophen (TYLENOL) 500 MG tablet Take 1 tablet (500 mg total) by mouth every 6 (six) hours as needed for pain. 02/06/12 02/05/13 Yes Verdene Rio, MD  amLODipine (NORVASC) 5 MG tablet Take 1 tablet (5 mg total) by mouth daily. 02/06/12 02/06/13 Yes Verdene Rio, MD  CALCIUM PO Take 1 tablet by mouth daily.   Yes Historical Provider, MD  donepezil (ARICEPT) 10 MG tablet Take 1 tablet (10 mg total) by mouth at bedtime. 02/06/12 03/07/13 Yes Verdene Rio, MD  lisinopril (PRINIVIL,ZESTRIL) 5 MG tablet Take 1 tablet (5 mg total) by mouth daily. 02/06/12 02/06/13 Yes Verdene Rio, MD  nitroGLYCERIN (NITROSTAT) 0.4 MG SL tablet Place 0.4 mg under the tongue every 5 (five) minutes as needed. For chest  pain   Yes Historical Provider, MD  traMADol (ULTRAM) 50 MG tablet Take 50 mg by mouth every 6 (six) hours as needed. For pain   Yes Historical Provider, MD    Scheduled Meds:    . aspirin  324 mg Oral Once  . donepezil  10 mg Oral QHS  . furosemide  20 mg Intravenous Once  . haloperidol lactate  2 mg Intravenous Once  . metoprolol tartrate  12.5 mg Oral BID  . pantoprazole  40 mg Oral Q0600  . pantoprazole (PROTONIX) IV  40 mg Intravenous Once  . sodium chloride  3 mL Intravenous Q12H  . sodium chloride      . DISCONTD: pantoprazole (PROTONIX) IV  40 mg Intravenous Q12H   Infusions:    . sodium chloride 10 mL/hr at 07/13/12 0047   PRN Meds: acetaminophen, acetaminophen, morphine injection, nitroGLYCERIN, ondansetron (ZOFRAN) IV, ondansetron, DISCONTD: nitroGLYCERIN   Allergies as of 07/12/2012 - Review Complete 07/12/2012  Allergen Reaction Noted  . Simvastatin      Family History  Problem Relation Age of Onset  . Cancer Sister     Died  . Diabetes Brother   . Coronary artery disease Mother     died age 38  . Diabetes Daughter     died age 94    History   Social History  . Marital Status: Widowed    Spouse Name: N/A    Number of Children: N/A  . Years of Education: N/A   Occupational History  . Not on file.   Social History Main Topics  . Smoking status: Former Games developer  . Smokeless tobacco: Current User    Types: Snuff   Comment: large bag/pack lasts 2 days.  . Alcohol Use: No  . Drug Use: Not on file  . Sexually Active: Not on file   Other Topics Concern  . Not on file   Social History Narrative   Red bag given 03/14/2009.Recently moved in with her daughter, she states because of problems with her neighbors and because her son moved out.Has 2 daughters but 1 is deceased from DM at age 58.     PHYSICAL EXAM: Vital signs in last 24 hours: Temp:  [98 F (36.7 C)-99.9 F (37.7 C)] 99.1 F (37.3 C) (08/26 1207) Pulse Rate:  [60-119] 90  (08/26  1120) Resp:  [17-33] 30  (08/26 1120) BP: (112-183)/(56-142) 121/60 mmHg (08/26 1207) SpO2:  [96 %-100 %] 99 % (08/26 0955) Weight:  [134 lb 0.6 oz (60.8 kg)] 134 lb 0.6 oz (60.8 kg) (08/25 1700)  General: pleasant, cooperative, confused aged AAF.  Does not look acutely ill.  Head:  RRR. No MRG  Eyes:  No icterus, conjunctiva  not pale Ears:  slightly HOH Nose:  No discharge or sneezing Mouth:  Dry but clear oral MM, no teeth Neck:  No mass or JVD. Lungs:  Clear B.  No dyspnea at rest Heart: RRR.  No MRG Abdomen:  Soft, NT, ND.  Low midline, well-healed, old surgical scar.   Rectal: Brown stool is soft and FOB +.  No melena or BRB.    Musc/Skeltl: no joint swelling.  Kyphotic spine.  Arthritic changes in hands Extremities:  No pedal edema  Neurologic:  Pleasant, oriented only to self.  Cooperative and does follow commands.  Moves all 4s.  No tremor.  Skin:  No rash or sores. Tattoos:  None seen Nodes:  No adenopathy at neck or groin   Psych:  Pleasant, cooperative.  Hand "mittens" in place.   Intake/Output from previous day: 08/25 0701 - 08/26 0700 In: 612.5 [I.V.:250; Blood:362.5] Out: 450 [Urine:450] Intake/Output this shift: Total I/O In: 254.2 [Blood:254.2] Out: 300 [Urine:300]  LAB RESULTS:  Basename 07/13/12 0418 07/12/12 1301  WBC 7.8 10.9*  HGB 6.8* 5.3*  HCT 22.7* 18.9*  PLT 172 232  MCV           73                  70   Ref. Range 07/12/2012 14:04  Iron Latest Range: 42-135 ug/dL <40 (L)  UIBC Latest Range: 125-400 ug/dL 981 (H)  TIBC Latest Range: 250-470 ug/dL Not calculated due to Iron <10.  Saturation Ratios Latest Range: 20-55 % Not calculated due to Iron <10.  Ferritin Latest Range: 10-291 ng/mL 4 (L)  Folate No range found >20.0      Ref. Range 07/12/2012 13:02 07/13/2012 04:18  CK, MB Latest Range: 0.3-4.0 ng/mL  16.7 (HH)  CK Total Latest Range: 7-177 U/L  325 (H)  Troponin I Latest Range: <0.30 ng/mL 0.81 (HH) 5.46 (HH)    BMET Lab Results    Component Value Date   NA 140 07/13/2012   NA 140 07/12/2012   NA 138 01/07/2011   K 4.2 07/13/2012   K 4.2 07/12/2012   K 4.1 01/07/2011   CL 109 07/13/2012   CL 106 07/12/2012   CL 106 01/07/2011   CO2 20 07/13/2012   CO2 20 07/12/2012   CO2 23 01/07/2011   GLUCOSE 112* 07/13/2012   GLUCOSE 169* 07/12/2012   GLUCOSE 97 01/07/2011   BUN 27* 07/13/2012   BUN 28* 07/12/2012   BUN 23 01/07/2011   CREATININE 1.26* 07/13/2012   CREATININE 1.15* 07/12/2012   CREATININE 1.14 01/07/2011   CALCIUM 8.8 07/13/2012   CALCIUM 9.1 07/12/2012   CALCIUM 9.9 01/07/2011   LFT  Basename 07/13/12 0445 07/12/12 1301  PROT 6.3 7.1  ALBUMIN 2.8* 3.2*  AST 75* 74*  ALT 50* 61*  ALKPHOS 73 84  BILITOT 1.9* 0.4  BILIDIR -- --  IBILI -- --   PT/INR Lab Results  Component Value Date   INR 1.02 07/12/2012   INR 1.00 09/09/2010   INR 1.0 11/22/2007     RADIOLOGY STUDIES: Dg Chest Port 1 View 07/12/2012  *RADIOLOGY REPORT*  Clinical Data: Shortness of breath.  PORTABLE CHEST - 1 VIEW  Comparison: 09/09/2010  Findings: There are patchy interstitial densities throughout both lungs.  There may be airspace disease in the left mid lung. Findings concerning for pulmonary edema.  Increased lucency in the periphery of the left hemithorax may represent hyperinflation although the lucency appears to be lateral  to the ribs.  Heart size is upper limits of normal.  IMPRESSION: Prominent interstitial lung markings with prominent perihilar structures.  Findings are suggestive for pulmonary edema.  Unusual lucency along the lateral left hemithorax as described.   Original Report Authenticated By: Richarda Overlie, M.D.     ENDOSCOPIC STUDIES: 09/10/2010   EGD  Dr Marina Goodell for Iron def anemia, FOB + 1) Benign Stricture in the distal esophagus  2) GAVE (gastric antral vascular ectasia) in the antrum. This  is the cause for recurrent iron deficiency anemia and heme + stool  3) Normal duodenum RECOMMENDATIONS:  1) TRANSFUSE TO DESIRED HG  2)  FESO4 325MG  BID INDEFINITELY  3) PCP TO MONITOR H/H CLOSELY AS OUT PATIENT. MAY NEED PERIODIC  TRANSFUSION IF UNABLE TO MAINTAIN HG ON IRON.  07/21/2009  Colonoscopy   Sheryn Bison, MD for anemia 1) Severe diverticulosis throughout the colon  2) Otherwise normal examination   IMPRESSION: *  Microcytic, iron deficincy anemia, hx chronic anemia.  S/p 3 of 4 units PRBC.  Note Dr Ludwig Clarks advisement to keep HGB at 10.0 or above. She is not there yet.  The stool is FOB positive but no reports of melena or blood per rectum. Given the low MCV, I suspect that she has chronic GI losses from AVMs.  Stopping the PO iron has led to recurrent anemia, as she is unable to build adequate replacement RBCs.  Needs life-long Iron.  *  Non STEMI, CHF.  Dr Jens Som states "I would favor conservative therapy given her age, continuing GI blood loss and mild dementia". *  Mildly elevated transaminases and T bilirubin.  ? Consequence of the MI and CHF/passive congestion?  Do not find prior liver imaging. S/P cholecystectomy remotely. No sxs to suggest GB disease. *  chronic renal insufficiency.  (23/1.14 in 01/08/12) *  Glucose intolerance, ? DM 2.  No prior dx of diabetes.  *  Dementia, this seems more than mild.  On Aricept.     PLAN: *  Ok to feed today, as she is not having acute GI bleed: solid, carb mod diet.  *  PO Protonix, but given past hx, this may not be necessary long-term.  *  Consider EGD tomorrow, I got an afternoon time slot for this on 8/27 at around 2 PM. *  CBC after 4th unit of blood.  *  ? Abdominal ultrasound? If yes:  Would get it in the AM. *  LFTs in AM    LOS: 1 day   Jennye Moccasin  07/13/2012, 12:10 PM Pager: [phone]  [facility] Attending I have also seen and assessed the patient and agree with the above note. She has a presumed occult blood loss anemia. With following additions.  1. I think EGD is optional as suspect known AVM's causing chronic GI blood loss. Will discuss  with daughter and decide. 2. IV iron supplementation x 1 with chronic po iron makes sense. 3. Would not do [place] - LFT increase not new and is likely multifactorial 9? Hypotension, meds, MI) 4. Will follow-up tomorrow - note she has multiple significant co-morbidities and though I believe she could tolerate EGD, she is at higehr risk of complications.  Iva Boop, MD, [facility] [phone] (pager) 07/13/2012 2:55 PM

## 2012-07-13 NOTE — Progress Notes (Signed)
Pt getting progressively more agitated and combative.  Dr. Virgina Organ notified, is now at bedside.  One time order for haldol received.  Will continue to monitor.  Roselie Awkward, RN

## 2012-07-14 ENCOUNTER — Encounter (HOSPITAL_COMMUNITY)
Admission: EM | Disposition: A | Discharge status: Home Health | Payer: Self-pay | Source: Home / Self Care | Attending: Internal Medicine

## 2012-07-14 DIAGNOSIS — R000 Tachycardia, unspecified: Secondary | ICD-10-CM

## 2012-07-14 DIAGNOSIS — K922 Gastrointestinal hemorrhage, unspecified: Secondary | ICD-10-CM

## 2012-07-14 DIAGNOSIS — R0602 Shortness of breath: Secondary | ICD-10-CM

## 2012-07-14 DIAGNOSIS — I219 Acute myocardial infarction, unspecified: Secondary | ICD-10-CM

## 2012-07-14 LAB — CBC
HCT: 30.7 % — ABNORMAL LOW (ref 36.0–46.0)
HCT: 31.5 % — ABNORMAL LOW (ref 36.0–46.0)
Hemoglobin: 9.7 g/dL — ABNORMAL LOW (ref 12.0–15.0)
Hemoglobin: 9.7 g/dL — ABNORMAL LOW (ref 12.0–15.0)
MCH: 24.3 pg — ABNORMAL LOW (ref 26.0–34.0)
Platelets: 193 10*3/uL (ref 150–400)
RBC: 3.99 MIL/uL (ref 3.87–5.11)
RBC: 4.02 MIL/uL (ref 3.87–5.11)
RBC: 4.05 MIL/uL (ref 3.87–5.11)
RDW: 20.2 % — ABNORMAL HIGH (ref 11.5–15.5)
WBC: 9.5 10*3/uL (ref 4.0–10.5)
WBC: 8.4 10*3/uL (ref 4.0–10.5)
WBC: 9.6 10*3/uL (ref 4.0–10.5)

## 2012-07-14 LAB — TYPE AND SCREEN
Unit division: 0
Unit division: 0

## 2012-07-14 LAB — COMPREHENSIVE METABOLIC PANEL
ALT: 82 U/L — ABNORMAL HIGH (ref 0–35)
Alkaline Phosphatase: 87 U/L (ref 39–117)
CO2: 21 mEq/L (ref 19–32)
Calcium: 9.1 mg/dL (ref 8.4–10.5)
Chloride: 109 mEq/L (ref 96–112)
GFR calc Af Amer: 41 mL/min — ABNORMAL LOW (ref >90)
GFR calc non Af Amer: 35 mL/min — ABNORMAL LOW (ref >90)
Glucose, Bld: 98 mg/dL (ref 70–99)
Sodium: 141 mEq/L (ref 135–145)
Total Bilirubin: 2.2 mg/dL — ABNORMAL HIGH (ref 0.3–1.2)

## 2012-07-14 SURGERY — EGD (ESOPHAGOGASTRODUODENOSCOPY)
Anesthesia: Moderate Sedation

## 2012-07-14 MED ORDER — HALOPERIDOL 1 MG PO TABS
1.0000 mg | ORAL_TABLET | Freq: Once | ORAL | Status: AC
  Administered 2012-07-14: 1 mg via ORAL
  Filled 2012-07-14: qty 1

## 2012-07-14 MED ORDER — HALOPERIDOL LACTATE 5 MG/ML IJ SOLN
1.0000 mg | Freq: Once | INTRAMUSCULAR | Status: AC
  Filled 2012-07-14: qty 0.2

## 2012-07-14 MED ORDER — FERROUS SULFATE 325 (65 FE) MG PO TABS
325.0000 mg | ORAL_TABLET | Freq: Two times a day (BID) | ORAL | Status: DC
  Administered 2012-07-14 – 2012-07-15 (×2): 325 mg via ORAL
  Filled 2012-07-14 (×4): qty 1

## 2012-07-14 MED ORDER — SODIUM CHLORIDE 0.9 % IV SOLN: INTRAVENOUS | Status: DC

## 2012-07-14 NOTE — Progress Notes (Signed)
Patient: Katherine [name] Date of Encounter: 07/14/2012, 9:59 AM Admit date: 07/12/2012     Subjective  Katherine [name] has no new complaints this AM. She denies CP or SOB.    Objective  Physical Exam: Vitals: BP 119/105  Pulse 82  Temp 99 F (37.2 C) (Oral)  Resp 26  Ht 5\' 6"  (1.676 m)  Wt 134 lb 0.6 oz (60.8 kg)  BMI 21.63 kg/m2  SpO2 99% General: Well developed, elderly 78 year old female in no acute distress. Neck: Supple. JVD not elevated. Lungs: Clear bilaterally to auscultation without wheezes, rales, or rhonchi. Breathing is unlabored. Heart: RRR S1 S2 without murmurs, rub or gallop.  Abdomen: Soft, non-distended. Extremities: No clubbing or cyanosis. No edema.  Distal pedal pulses are 2+ and equal bilaterally. Neuro: Alert and oriented to place. Moves all extremities spontaneously.   Intake/Output: Intake/Output Summary (Last 24 hours) at 07/14/12 0959 Last data filed at 07/14/12 0700  Gross per 24 hour  Intake   1015 ml  Output    726 ml  Net    289 ml   Inpatient Medications:  . donepezil  10 mg Oral QHS  . ferrous sulfate  325 mg Oral BID WC  . haloperidol lactate  2 mg Intravenous Once  . metoprolol tartrate  12.5 mg Oral BID  . pantoprazole  40 mg Oral Q0600  . sodium chloride  3 mL Intravenous Q12H  . DISCONTD: pantoprazole (PROTONIX) IV  40 mg Intravenous Q12H   . sodium chloride 10 mL/hr at 07/13/12 0047   Labs:  [facility] 07/14/12 0608 07/13/12 0445  NA 141 140  K 4.0 4.2  CL 109 109  CO2 21 20  GLUCOSE 98 112*  BUN 31* 27*  CREATININE 1.32* 1.26*  CALCIUM 9.1 8.8  MG -- --  PHOS -- --    Basename 07/14/12 0608 07/13/12 0445  AST 111* 75*  ALT 82* 50*  ALKPHOS 87 73  BILITOT 2.2* 1.9*  PROT 6.5 6.3  ALBUMIN 2.8* 2.8*   No results found for this basename: LIPASE:2,AMYLASE:2 in the last 72 hours  Basename 07/14/12 0608 07/13/12 1751  WBC 9.5 10.3  NEUTROABS -- --  HGB 9.7* 10.2*  HCT 30.5* 32.4*  MCV 76.4* 75.9*  PLT 224  PLATELET CLUMPS NOTED ON SMEAR, COUNT APPEARS ADEQUATE    Basename 07/13/12 1751 07/13/12 0418 07/12/12 1302  CKTOTAL 358* 325* --  CKMB 11.3* 16.7* --  TROPONINI 3.93* 5.46* 0.81*   No components found with this basename: POCBNP:3  Basename 07/12/12 1301  DDIMER 14.25*    Basename 07/13/12 0418  HGBA1C 5.7*   No results found for this basename: CHOL,HDL,LDLCALC,TRIG,CHOLHDL in the last 72 hours  Basename 07/13/12 0418  TSH 3.546  T4TOTAL --  T3FREE --  THYROIDAB --    Basename 07/12/12 1404  VITAMINB12 360  FOLATE >20.0  FERRITIN 4*  TIBC Not calculated due to Iron <10.  IRON <10*  RETICCTPCT 1.6    Radiology/Studies: Dg Chest Port 1 View 07/12/2012   *RADIOLOGY REPORT*  Clinical Data: Shortness of breath.  PORTABLE CHEST - 1 VIEW  Comparison: 09/09/2010  Findings: There are patchy interstitial densities throughout both lungs.  There may be airspace disease in the left mid lung. Findings concerning for pulmonary edema.  Increased lucency in the periphery of the left hemithorax may represent hyperinflation although the lucency appears to be lateral to the ribs.  Heart size is upper limits of normal.  IMPRESSION: Prominent interstitial  lung markings with prominent perihilar structures.  Findings are suggestive for pulmonary edema.  Unusual lucency along the lateral left hemithorax as described.    Original Report Authenticated By: Richarda Overlie, M.D.     Echocardiogram: Conclusions: - Left ventricle: The cavity size was normal. There was moderate concentric hypertrophy. Systolic function was normal. The estimated ejection fraction was in the range of 55% to 60%. Possible hypokinesis of the basal inferior myocardium. Doppler parameters are consistent with abnormal left ventricular relaxation (grade 1 diastolic dysfunction). - Aortic valve: Mild to moderate regurgitation. - Mitral valve: Calcified annulus. Mildly thickened leaflets. Mild to moderate regurgitation. - Pulmonary  arteries: PA peak pressure: 67mm Hg (S).  Telemetry: normal sinus rhythm; no arrhythmias   Assessment and Plan  1. Non-ST elevation myocardial infarction - In the setting of significant anemia with Hgb 5.3 and active GI blood loss, she is not a candidate for anticoagulation including aspirin, Plavix or heparin. She is not a candidate for invasive cardiac evaluation. Echo yesterday showed preserved LV function. Continue low-dose metoprolol. Note she does have a first degree AV block on her electrocardiogram and we will follow for bradycardia. Favor conservative therapy given her age, active GI bleed and dementia.  2. GI bleed - per GI team; transfuse to keep Hgb>10 if possible.  3. Dementia - per primary service/medical team  4. Elevated transaminases - etiology unclear. No statin at this time.  5. Chronic kidney disease   Dr. Johney Frame to see and make further recommendations. Signed, EDMISTEN, BROOKE PA-C  I have seen, examined the patient, and reviewed the above assessment and plan.  Changes to above are made where necessary.   Anemia is better.  Pt is very aggitation.  Denies CP or SOB. Her NSTEMI is due to profound anemia.  Treating her anemia is the treatment of choice.  She is not a candidate for cardiology procedures.  Holding ASA due to bleeding.  For now, would treat with beta blockers alone.  We will see as needed. Please call with questions.  Co Sign: Hillis Range, MD 07/14/2012 5:48 PM

## 2012-07-14 NOTE — Progress Notes (Signed)
      Gi Daily Rounding Note 07/14/2012, 9:45 AM  SUBJECTIVE:       Staff unable to contact daughter, phone has been disconnected.  Pt remains confused, sitter at bedside. Calmer after Haldol.   OBJECTIVE:         Vital signs in last 24 hours:    Temp:  [98 F (36.7 C)-99.1 F (37.3 C)] 99 F (37.2 C) (08/27 0700) Pulse Rate:  [71-110] 82  (08/27 0349) Resp:  [23-33] 26  (08/27 0349) BP: (105-158)/(60-106) 119/105 mmHg (08/27 0349) SpO2:  [95 %-100 %] 99 % (08/27 0349) Last BM Date: 07/13/12 General: looks frail and aged.  Not toxic.    Heart: RRR Chest: clear in front Abdomen: active BS, ND, NT.   Extremities: no pedal edema Neuro/Psych:  Confused, mildly agitated but able to calm her down.  Moves all 4s.  Speech is garbled, rambling but mostly following a train of thought.  Follows commands.   Intake/Output from previous day: 08/26 0701 - 08/27 0700 In: 1015 [P.O.:390; Blood:625] Out: 876 [Urine:875; Stool:1]  Intake/Output this shift:    Lab Results:  Basename 07/14/12 0608 07/13/12 1751 07/13/12 0418  WBC 9.5 10.3 7.8  HGB 9.7* 10.2* 6.8*  HCT 30.5* 32.4* 22.7*  PLT 224 PLATELET CLUMPS NOTED ON SMEAR, COUNT APPEARS ADEQUATE 172   BMET  Basename 07/14/12 0608 07/13/12 0445 07/12/12 1301  NA 141 140 140  K 4.0 4.2 4.2  CL 109 109 106  CO2 21 20 20   GLUCOSE 98 112* 169*  BUN 31* 27* 28*  CREATININE 1.32* 1.26* 1.15*  CALCIUM 9.1 8.8 9.1   LFT  Basename 07/14/12 0608 07/13/12 0445 07/12/12 1301  PROT 6.5 6.3 7.1  ALBUMIN 2.8* 2.8* 3.2*  AST 111* 75* 74*  ALT 82* 50* 61*  ALKPHOS 87 73 84  BILITOT 2.2* 1.9* 0.4  BILIDIR -- -- --  IBILI -- -- --   PT/INR  Basename 07/12/12 1301  LABPROT 13.6  INR 1.02   ASSESMENT: * Microcytic, iron deficincy anemia, hx chronic anemia. S/p 4 units PRBC. Note Dr Ludwig Clarks advisement to keep HGB at 10.0 or above.   The stool is FOB positive but no reports of melena or blood per rectum.  Given the low MCV, I  suspect that she has chronic GI losses from AVMs. Stopping the PO iron has led to recurrent anemia, as she is unable to build adequate replacement RBCs. Needs life-long Iron.  * Non STEMI, CHF. Dr Jens Som states "I would favor conservative therapy given her age, continuing GI blood loss and mild dementia".  * Mildly elevated transaminases and T bilirubin, chronic.  No plans for ultrasound or further evaluation. * chronic renal insufficiency. (23/1.14 in 01/08/12)  * Glucose intolerance, ? DM 2. No prior dx of diabetes.  * Dementia, this seems more than mild. On Aricept.    PLAN: *  Cancelled EGD for now.  Resume po's. *  Give po iron BID.  Will defer parenteral iron ordering to Hospitalist.      LOS: 2 days   Jennye Moccasin  07/14/2012, 9:45 AM Pager: [phone]   Agree with Ms. Heron Nay assessment and plan.  I recommend she get parenteral iron.  I think transfusion to Hgb about 10 and parenteral and then po iron is reasonable management for this patient.  Iva Boop, MD, [facility]

## 2012-07-14 NOTE — Telephone Encounter (Signed)
There is no other contact number available.  I will await a return phone call from the patient or the family.

## 2012-07-14 NOTE — Progress Notes (Signed)
Internal Medicine Teaching Service Attending Note Date: 07/14/2012  Patient name: Katherine [name]  Medical record number: [id number]  Date of birth: [birth year]    This patient has been seen and discussed with the house staff. Please see their note for complete details. I concur with their findings with the following additions/corrections: Ms Dubberly was lying in bed in NAD. She had one sitter at bedside. She is currently calm although required two sitters, pharm restraints, and physical restraints last PM. HgB stable past two checks. Team / GI will discuss EGD with daughter later today. Stable for transfer to tele bed. Med mgmt for her NSTEMI - BB, ASA contraindicated. Follow LFT's for now.   BUTCHER,ELIZABETH 07/14/2012, 12:09 PM

## 2012-07-14 NOTE — Progress Notes (Signed)
Subjective: Ms. Crosley was seen and examined at bedside.  She is less agitated today and very calm with no soft mittens on hands this morning.  She tolerated breakfast well this morning.  Reports no major complaints at this time.  She is alert to person and place but is still confused on time.  Her daughter was finally able to be contacted and will be by this afternoon/evening after work.  She denies any headaches, nausea, vomiting, diarrhea, chest pain, or shortness of breath.  Objective: Vital signs in last 24 hours: Filed Vitals:   07/14/12 0000 07/14/12 0349 07/14/12 0700 07/14/12 1100  BP: 124/66 119/105 123/71 131/71  Pulse: 101 82    Temp: 99.1 F (37.3 C) 99.1 F (37.3 C) 99 F (37.2 C)   TempSrc: Oral Oral Oral   Resp: 28 26    Height:      Weight:      SpO2: 98% 99%     Weight change:   Intake/Output Summary (Last 24 hours) at 07/14/12 1147 Last data filed at 07/14/12 1100  Gross per 24 hour  Intake 760.83 ml  Output    776 ml  Net -15.17 ml   Physical Exam:  Blood pressure 128/66, pulse 119, temperature 98.5 F (36.9 C), SpO2 100.00%.  Vitals reviewed.  General: resting in bed, NAD, occasionally laughing HEENT: PERRLA, EOMI  Cardiac: RRR no rubs, or gallops  Pulm: clear to auscultation bilaterally, no wheezes, rales, or rhonchi  Abd: soft, nontender, nondistended, +BS present, + periumbilical scar with folded skin  Ext: warm and well perfused, no pedal edema, scds in place Neuro: alert and oriented X2 to person and place, not to time, cranial nerves II-XII grossly intact, strength and sensation to light touch equal in bilateral lower extremities with LUE>RUE.   Lab Results: Basic Metabolic Panel:  Lab 07/14/12 1610 07/13/12 0445  NA 141 140  K 4.0 4.2  CL 109 109  CO2 21 20  GLUCOSE 98 112*  BUN 31* 27*  CREATININE 1.32* 1.26*  CALCIUM 9.1 8.8  MG -- --  PHOS -- --   Liver Function Tests:  Lab 07/14/12 0608 07/13/12 0445  AST 111* 75*  ALT 82* 50*   ALKPHOS 87 73  BILITOT 2.2* 1.9*  PROT 6.5 6.3  ALBUMIN 2.8* 2.8*   CBC:  Lab 07/14/12 1035 07/14/12 0608  WBC 8.4 9.5  NEUTROABS -- --  HGB 9.6* 9.7*  HCT 30.7* 30.5*  MCV 76.4* 76.4*  PLT 193 224   Cardiac Enzymes:  Lab 07/13/12 1751 07/13/12 0418 07/12/12 1302  CKTOTAL 358* 325* --  CKMB 11.3* 16.7* --  CKMBINDEX -- -- --  TROPONINI 3.93* 5.46* 0.81*   BNP:  Lab 07/12/12 1302  PROBNP 7255.0*   D-Dimer:  Lab 07/12/12 1301  DDIMER 14.25*   Coagulation:  Lab 07/12/12 1301  LABPROT 13.6  INR 1.02   Anemia Panel:  Lab 07/12/12 1404  VITAMINB12 360  FOLATE >20.0  FERRITIN 4*  TIBC Not calculated due to Iron <10.  IRON <10*  RETICCTPCT 1.6   Urine Drug Screen: Drugs of Abuse     Component Value Date/Time   LABOPIA NONE DETECTED 11/22/2007 1900   COCAINSCRNUR NONE DETECTED 11/22/2007 1900   LABBENZ NONE DETECTED 11/22/2007 1900   AMPHETMU NONE DETECTED 11/22/2007 1900   THCU NONE DETECTED 11/22/2007 1900   LABBARB  Value: NONE DETECTED        DRUG SCREEN FOR MEDICAL PURPOSES ONLY.  IF CONFIRMATION IS NEEDED  FOR ANY PURPOSE, NOTIFY LAB WITHIN 5 DAYS. 11/22/2007 1900    Urinalysis:  Lab 07/12/12 1606  COLORURINE YELLOW  LABSPEC 1.020  PHURINE 5.0  GLUCOSEU NEGATIVE  HGBUR NEGATIVE  BILIRUBINUR NEGATIVE  KETONESUR NEGATIVE  PROTEINUR 30*  UROBILINOGEN 1.0  NITRITE NEGATIVE  LEUKOCYTESUR NEGATIVE   Micro Results: Recent Results (from the past 240 hour(s))  MRSA PCR SCREENING     Status: Normal   Collection Time   07/12/12  5:42 PM      Component Value Range Status Comment   MRSA by PCR NEGATIVE  NEGATIVE Final    Studies/Results: Dg Chest Port 1 View  07/12/2012  *RADIOLOGY REPORT*  Clinical Data: Shortness of breath.  PORTABLE CHEST - 1 VIEW  Comparison: 09/09/2010  Findings: There are patchy interstitial densities throughout both lungs.  There may be airspace disease in the left mid lung. Findings concerning for pulmonary edema.  Increased lucency in  the periphery of the left hemithorax may represent hyperinflation although the lucency appears to be lateral to the ribs.  Heart size is upper limits of normal.  IMPRESSION: Prominent interstitial lung markings with prominent perihilar structures.  Findings are suggestive for pulmonary edema.  Unusual lucency along the lateral left hemithorax as described.   Original Report Authenticated By: Richarda Overlie, M.D.    [facility]* *[facility]* [address] [place], [place] [address] [phone]  ------------------------------------------------------------ Transthoracic Echocardiography  Patient: Anahli, Arvanitis MR #: [id number] Study Date: 07/13/2012 Gender: F Age: 78 Height: 167.6cm Weight: 60.8kg BSA: 1.66m^2 Pt. Status: Room: 3306  PERFORMING [facility], Ec ADMITTING Blanch Media ATTENDING Lorenso Quarry, [facility] Blanch Media SONOGRAPHER [place], RDCS cc:  ------------------------------------------------------------ LV EF: 55% - 60%  ------------------------------------------------------------ Indications: MI - acute 410.91.  ------------------------------------------------------------ History: PMH: Transient ischemic attack. Risk factors: GI bleed. Vitamin B12 deficiency. Anemia. First degree heart block. Renal insufficiency. Left breast mass. Rheumatoid arthritis. Osteoporosis. Memory loss. Dementia. Former tobacco use. Hypertension. Dyslipidemia.  ------------------------------------------------------------ Study Conclusions  - Left ventricle: The cavity size was normal. There was moderate concentric hypertrophy. Systolic function was normal. The estimated ejection fraction was in the range of 55% to 60%. Possible hypokinesis of the basalinferior myocardium. Doppler parameters are consistent with abnormal left ventricular relaxation (grade 1 diastolic dysfunction). - Aortic  valve: Mild to moderate regurgitation. - Mitral valve: Calcified annulus. Mildly thickened leaflets . Mild to moderate regurgitation. - Pulmonary arteries: PA peak pressure: 67mm Hg (S). Transthoracic echocardiography. M-mode, complete 2D, spectral Doppler, and color Doppler. Height: Height: 167.6cm. Height: 66in. Weight: Weight: 60.8kg. Weight: 133.7lb. Body mass index: BMI: 21.6kg/m^2. Body surface area: BSA: 1.11m^2. Blood pressure: 120/61. Patient status: Inpatient. Location: ICU/CCU  ------------------------------------------------------------  ------------------------------------------------------------ Left ventricle: The cavity size was normal. There was moderate concentric hypertrophy. Systolic function was normal. The estimated ejection fraction was in the range of 55% to 60%. Regional wall motion abnormalities: Possible hypokinesis of the basalinferior myocardium. Doppler parameters are consistent with abnormal left ventricular relaxation (grade 1 diastolic dysfunction).  ------------------------------------------------------------ Aortic valve: Mildly thickened, mildly calcified leaflets. Cusp separation was normal. Doppler: Transvalvular velocity was within the normal range. There was no stenosis. Mild to moderate regurgitation.  ------------------------------------------------------------ Aorta: The aorta was normal, not dilated, and non-diseased.  ------------------------------------------------------------ Mitral valve: Calcified annulus. Mildly thickened leaflets . Leaflet separation was normal. Doppler: Transvalvular velocity was within the normal range. There was no evidence for stenosis. Mild to moderate regurgitation.  ------------------------------------------------------------ Left atrium: The atrium was normal in  size.  ------------------------------------------------------------ Right ventricle: The cavity size was normal. Wall thickness was normal.  Systolic function was normal.  ------------------------------------------------------------ Pulmonic valve: Structurally normal valve. Cusp separation was normal. Doppler: Transvalvular velocity was within the normal range. Trivial regurgitation.  ------------------------------------------------------------ Tricuspid valve: Structurally normal valve. Leaflet separation was normal. Doppler: Transvalvular velocity was within the normal range. Mild regurgitation.  ------------------------------------------------------------ Right atrium: The atrium was normal in size.  ------------------------------------------------------------ Pericardium: The pericardium was normal in appearance. There was no pericardial effusion.  ------------------------------------------------------------ Systemic veins: Inferior vena cava: The vessel was dilated; the respirophasic diameter changes were blunted (< 50%); findings are consistent with elevated central venous pressure.  ------------------------------------------------------------ Post procedure conclusions Ascending Aorta:  - The aorta was normal, not dilated, and non-diseased.  ------------------------------------------------------------  2D measurements Normal Doppler measurements Normal Left ventricle Main pulmonary LVID ED, 40.7 mm 43-52 artery chord, Pressure, S 67 mm =30 PLAX Hg LVID ES, 35.1 mm 23-38 Aortic valve chord, Regurg PHT 275 ms ------ PLAX Mitral valve FS, chord, 14 % >29 Peak E vel 55. cm/s ------ PLAX 3 LVPW, ED 13.2 mm ------ Peak A vel 99. cm/s ------ IVS/LVPW 1.25 <1.3 2 ratio, ED Deceleration 225 ms 150-23 Ventricular septum time 0 IVS, ED 16.5 mm ------ Peak E/A 0.6 ------ Aorta ratio Root diam, 34 mm ------ Tricuspid valve ED Regurg peak 341 cm/s ------ Left atrium vel AP dim 29 mm ------ Peak RV-RA 47 mm ------ AP dim 1.72 cm/m^2 <2.2 gradient, S Hg index Systemic veins Vol, S 90 ml ------ Estimated CVP  20 mm ------ Vol index, 53.3 ml/m^2 ------ Hg S Right ventricle Pressure, S 67 mm <30 Hg Sa vel, lat 11 cm/s ------ ann, tiss DP  ------------------------------------------------------------ Prepared and Electronically Authenticated by  Donato Schultz 2013-08-26T13:19:[id number]  Medications: I have reviewed the patient's current medications. Scheduled Meds:    . donepezil  10 mg Oral QHS  . ferrous sulfate  325 mg Oral BID WC  . haloperidol lactate  2 mg Intravenous Once  . metoprolol tartrate  12.5 mg Oral BID  . pantoprazole  40 mg Oral Q0600  . sodium chloride  3 mL Intravenous Q12H  . white petrolatum      . DISCONTD: pantoprazole (PROTONIX) IV  40 mg Intravenous Q12H   Continuous Infusions:    . sodium chloride 10 mL/hr at 07/13/12 0047  . 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  # GI bleed: Presents with complaints of new onset shortness of breath. She is not a great historian with her dementia -the history was obtained by daughter. On exam, she was found to have heme positive stools and hemoglobin was 5.3 . Given her history of GAVE disease on endoscopy in 10/11, this is most likely recurrence of GAVE disease. Other possibility includes AVM's vs erosive gastritis vs PUD vs dileuafoy's lesion vs. malignancy  - CBC q12.  - Transfused 4 units  of PRBC since admission;Cardio recommends Hb to be transfused to 10 if possible  - Morning Hb 9.6 - Hold BP meds.  - GI consulted--Cancel EGD for now will discuss with daughter and decide in setting of multiple comorbidities and risk of complications.   - chronic po iron -  Protonix   #  NSTEMI type 2: likely 2/2 demand ischemia from her anemia in the setting of GI bleed.  Daughter states she has been reporting on and off chest pain for last 1 month, 3 days ago cp relieved with nitro . She became SOB this AM when EMS was called. Her POC troponin was elevated to 0.81 and her D- dimer was elevated. Her EKG on admission showed ST depression in leads II, III, avF, V4, V5 and V6.  - Continue to monitor for now.  - CE--Troponin down to 3.93 - Cardiology following: NSTEMI--ruled in for infarct, but conservative therapy in setting of significant anemia 2/2 GIB, hold all anticoagulation at this time, no ASA, heparin, or plavix, not a candidate for invasive cardiac evaluation.   - Continue low dose metoprolol but follow for bradycardia.  - f/u echo--EF 55-60%. Moderate concentric hypertrophy, grade 1 diastolic dysfunction, mild to moderate AR, calcified mitral valve with mild to moderate regurge, PA peak pressure .  - PRN NTG ( Hold if SBP <27mm Hg)  - do not start statin at this time - repeat EKG--ST depressions resolved,    #Tachycardia: likely 2/2 anemia causing high output state. Her D- dimer is elevated but this could be likely 2/2 to her volume overload/congestion with anemia. PE is low probability with her WELL's score of 2 (given her history of breast cancer and tachycardia) and modified Geneva score of 6. Her elevated D- dimer could be due to age, history of malignancy, and RA. She is not a candidate for anticoagulation at this time.  - Stable - Monitor vitals for now.   # Elevated Pro- BNP : ?could be 2/2 anemia causing some vascular congestion. She was found to have increased interstial markings on her CXR. Her last echo in 2009 showed EF of 70% with no diastolic dysfunction. Anemia causes activation of the baroreceptors (as it does in low-output CHF), which, in turn, activates the sympathetic, renin-angiotensin-aldosterone system and vasopressin systems, causing  tachycardia and peripheral vasoconstriction and a decrease in renal blood flow and glomerular filtration rate. This leads to salt and water retention and an increase in extracellular and plasma volume.  - [id number]  - Lasix with transfusions given -  2D echo--grade 1 diastolic dysfunction, EF 55-60%  - Cardiology following  # Dementia: worse than baseline as reported by daughter likely in the setting of acute illness.  - continue to monitor mental status  - More calm today after being agitated yesterday  # HTN: BP stable.  -Hold BP meds in the setting of GI bleed.   # Hx of breast cancer; high-grade intraductal carcinoma, status post lumpectomy and radiation therapy in February 2000.  -stable  # Hx of RA: stable.  -does not meet criteria for bisphosphonates with CrCl:29  DVT: SCD's  Diet: Heart Healthy Dispo: pending clinical improvement, stable Hb/Hct, GI consultation   LOS: 2 days   Darden Palmer 07/14/2012, 11:47 AM

## 2012-07-15 ENCOUNTER — Encounter (HOSPITAL_COMMUNITY)
Admission: EM | Disposition: A | Discharge status: Home Health | Payer: Self-pay | Source: Home / Self Care | Attending: Internal Medicine

## 2012-07-15 ENCOUNTER — Encounter (HOSPITAL_COMMUNITY): Payer: Self-pay | Admitting: *Deleted

## 2012-07-15 HISTORY — PX: ESOPHAGOGASTRODUODENOSCOPY: SHX5428

## 2012-07-15 LAB — COMPREHENSIVE METABOLIC PANEL
ALT: 73 U/L — ABNORMAL HIGH (ref 0–35)
AST: 75 U/L — ABNORMAL HIGH (ref 0–37)
Albumin: 2.8 g/dL — ABNORMAL LOW (ref 3.5–5.2)
Alkaline Phosphatase: 83 U/L (ref 39–117)
CO2: 22 mEq/L (ref 19–32)
Chloride: 109 mEq/L (ref 96–112)
GFR calc Af Amer: 44 mL/min — ABNORMAL LOW (ref >90)
GFR calc non Af Amer: 38 mL/min — ABNORMAL LOW (ref >90)
Potassium: 3.9 mEq/L (ref 3.5–5.1)
Total Bilirubin: 1.4 mg/dL — ABNORMAL HIGH (ref 0.3–1.2)

## 2012-07-15 LAB — HEMOGLOBIN AND HEMATOCRIT, BLOOD
HCT: 30.8 % — ABNORMAL LOW (ref 36.0–46.0)
Hemoglobin: 9.5 g/dL — ABNORMAL LOW (ref 12.0–15.0)

## 2012-07-15 SURGERY — EGD (ESOPHAGOGASTRODUODENOSCOPY)
Anesthesia: Moderate Sedation

## 2012-07-15 MED ORDER — FERROUS SULFATE 325 (65 FE) MG PO TABS
325.0000 mg | ORAL_TABLET | Freq: Two times a day (BID) | ORAL | Status: DC
  Administered 2012-07-16 (×2): 325 mg via ORAL
  Filled 2012-07-15 (×3): qty 1

## 2012-07-15 MED ORDER — FENTANYL CITRATE 0.05 MG/ML IJ SOLN
INTRAMUSCULAR | Status: DC | PRN
  Administered 2012-07-15 (×2): 25 ug via INTRAVENOUS

## 2012-07-15 MED ORDER — MIDAZOLAM HCL 10 MG/2ML IJ SOLN
INTRAMUSCULAR | Status: DC | PRN
  Administered 2012-07-15 (×3): 1 mg via INTRAVENOUS

## 2012-07-15 MED ORDER — SODIUM CHLORIDE 0.9 % IV SOLN
1000.0000 mg | Freq: Once | INTRAVENOUS | Status: AC
  Administered 2012-07-15: 1000 mg via INTRAVENOUS
  Filled 2012-07-15 (×2): qty 20

## 2012-07-15 MED ORDER — SODIUM CHLORIDE 0.9 % IV SOLN
25.0000 mg | Freq: Once | INTRAVENOUS | Status: AC
  Administered 2012-07-15: 25 mg via INTRAVENOUS
  Filled 2012-07-15: qty 0.5

## 2012-07-15 MED ORDER — FENTANYL CITRATE 0.05 MG/ML IJ SOLN
INTRAMUSCULAR | Status: AC
  Filled 2012-07-15: qty 2

## 2012-07-15 MED ORDER — SODIUM CHLORIDE 0.9 % IV SOLN: INTRAVENOUS | Status: DC

## 2012-07-15 MED ORDER — MIDAZOLAM HCL 5 MG/ML IJ SOLN
INTRAMUSCULAR | Status: AC
  Filled 2012-07-15: qty 2

## 2012-07-15 NOTE — Progress Notes (Signed)
     Katherine [name] Daily Rounding Note 07/15/2012, 8:38 AM  SUBJECTIVE:       Pt denies abd pain, no bloody or dark stool reported.  Eating well but currently NPO per order of teaching service.  Dtr at bedside, took day off as was told pt would be having EGD today.   OBJECTIVE:         Vital signs in last 24 hours:    Temp:  [97.8 F (36.6 C)-98.2 F (36.8 C)] 98.2 F (36.8 C) (08/28 0500) Pulse Rate:  [74-85] 85  (08/28 0500) Resp:  [21-22] 21  (08/27 1943) BP: (118-140)/(71-77) 140/77 mmHg (08/28 0500) SpO2:  [100 %] 100 % (08/28 0500) Last BM Date: 07/13/12 General: frail, calm, aged. NAD  Heart: RRR  Chest: Clear B.  No SOB Abdomen: soft, NT, ND.  No mass or bruits.  No HSM Extremities: no pedal edema Neuro/Psych:  Pleasant, follows commands,  Not currently agitated or delerious.    Lab Results:  Basename 07/14/12 2023 07/14/12 1035 07/14/12 0608  WBC 9.6 8.4 9.5  HGB 9.7* 9.6* 9.7*  HCT 31.5* 30.7* 30.5*  PLT 203 193 224   BMET  Basename 07/14/12 0608 07/13/12 0445 07/12/12 1301  NA 141 140 140  K 4.0 4.2 4.2  CL 109 109 106  CO2 21 20 20   GLUCOSE 98 112* 169*  BUN 31* 27* 28*  CREATININE 1.32* 1.26* 1.15*  CALCIUM 9.1 8.8 9.1   LFT  Basename 07/14/12 0608 07/13/12 0445 07/12/12 1301  PROT 6.5 6.3 7.1  ALBUMIN 2.8* 2.8* 3.2*  AST 111* 75* 74*  ALT 82* 50* 61*  ALKPHOS 87 73 84  BILITOT 2.2* 1.9* 0.4  BILIDIR -- -- --  IBILI -- -- --    ASSESMENT: * Microcytic, iron deficiency anemia, hx chronic anemia. S/p 4 units PRBC. Note Dr Ludwig Clarks advisement to keep HGB at 10.0 or above.  The stool is FOB positive but no reports of melena or blood per rectum.  Given the low MCV, this is likely from slow [name] losses from AVMs. Stopping PO iron has led to recurrent anemia, as she is unable to build adequate replacement RBCs. Needs life-long Iron. Started BID PO Iron sulfate 8/27.  No orders yet for parenteral Iron, as recommended by Dr Leone Payor.  * Non STEMI, CHF. Dr  Jens Som states "I would favor conservative therapy given her age, continuing [name] blood loss and mild dementia".  Cardiology has signed off.  * Mildly elevated transaminases and T bilirubin, chronic. No plans for ultrasound or further evaluation.  * chronic renal insufficiency. (23/1.14 in 01/08/12)  * Glucose intolerance, ? DM 2. No prior dx of diabetes.  * Dementia, this seems more than mild. On Aricept.      PLAN: *  EGD today.  *  Parenteral Iron infusion, leave orders to resident/teaching team.   LOS: 3 days   Jennye Moccasin  07/15/2012, 8:38 AM Pager: [phone]   Wadley [name] Attending  I have also seen and assessed the patient and agree with the above note.  Iva Boop, MD, [facility] [phone] (pager) 07/15/2012 4:34 PM

## 2012-07-15 NOTE — Progress Notes (Signed)
No reaction to test dose of iv iron noted. Pharmacy notified that patient needs next dose.

## 2012-07-15 NOTE — Op Note (Addendum)
[facility] [address] [place], [address]   ENDOSCOPY PROCEDURE REPORT  PATIENT: Katherine [name], Katherine [name]  MR#: [id number] BIRTHDATE: 12-Feb-1923 , 89  yrs. old GENDER: Female ENDOSCOPIST: Iva Boop, MD, [facility] REFERRED BY: PROCEDURE DATE:  07/15/2012 PROCEDURE:  EGD w/ ablation ASA CLASS:     Class III INDICATIONS:  anemia secondary to chronic blood loss.   AV malformation. MEDICATIONS: Fentanyl 50 mcg IV and Versed 4 mg IV TOPICAL ANESTHETIC: none  DESCRIPTION OF PROCEDURE: After the risks benefits and alternatives of the procedure were thoroughly explained, informed consent was obtained.  The Pentax Gastroscope [id number] endoscope was introduced through the mouth and advanced to the second portion of the duodenum. Without limitations.  The instrument was slowly withdrawn as the mucosa was fully examined.      STOMACH: A large bleeding angiodysplastic lesion was found in the gastric antrum.   Gastric antral vascular ectasia oozing blood after contact. It was treated and ablated with APC setting 40 and 1L flow.  The remainder of the upper endoscopy exam was otherwise normal. Retroflexed views revealed no abnormalities.     The scope was then withdrawn from the patient and the procedure completed.  COMPLICATIONS: There were no complications. ENDOSCOPIC IMPRESSION: 1.   Bleeding angiodysplastic lesion in the gastric antrum - GAVE - oozing - ablated and treated with APC 2.   The remainder of the upper endoscopy exam was otherwise normal  RECOMMENDATIONS: 1.  continue PPI single dose daily for 1 month 2.  daily chronic iron therapy DO NOT STOP CBC monthly retreat if cannot hold Hgb steady with iron therapy    eSigned:  Iva Boop, MD, [facility] 07/15/2012 5:20 PM Revised: 07/15/2012 5:20 PM

## 2012-07-15 NOTE — Discharge Summary (Signed)
Internal Medicine Teaching [facility] Discharge Note  Name: Katherine [name] MRN: [id number] DOB: [birth year] 78 y.o.  Date of Admission: 07/12/2012 12:42 PM Date of Discharge: 07/16/2012 Attending Physician: Burns Spain, MD  Discharge Diagnosis: Active Problems:  NSTEMI (non-ST elevated myocardial infarction)  Iron deficiency anemia secondary to blood loss (chronic)  Gastric antral vascular ectasia (watermelon stomach)  Discharge Medications: Medication List  As of 07/16/2012  2:03 PM   STOP taking these medications         amLODipine 5 MG tablet      CALCIUM PO      lisinopril 5 MG tablet      traMADol 50 MG tablet         TAKE these medications         acetaminophen 500 MG tablet   Commonly known as: TYLENOL   Take 1 tablet (500 mg total) by mouth every 6 (six) hours as needed for pain.      CALCIUM & VIT D3 BONE HEALTH Liqd   Take 500 mg by mouth 2 (two) times daily with a meal.      docusate sodium 100 MG capsule   Commonly known as: COLACE   Take 1 capsule (100 mg total) by mouth 2 (two) times daily.      donepezil 10 MG tablet   Commonly known as: ARICEPT   Take 1 tablet (10 mg total) by mouth at bedtime.      ferrous sulfate 325 (65 FE) MG tablet   Take 1 tablet (325 mg total) by mouth 2 (two) times daily with a meal.      metoprolol tartrate 12.5 mg Tabs   Commonly known as: LOPRESSOR   Take 0.5 tablets (12.5 mg total) by mouth 2 (two) times daily.      nitroGLYCERIN 0.4 MG SL tablet   Commonly known as: NITROSTAT   Place 0.4 mg under the tongue every 5 (five) minutes as needed. For chest pain      pantoprazole 40 MG tablet   Commonly known as: PROTONIX   Take 1 tablet (40 mg total) by mouth daily at 6 (six) AM.           Disposition and follow-up:   Katherine [name] was discharged from [facility] in Stable condition.  At the hospital follow up visit please address: 1. Pt needs monthly CBC to ensure HgB is  stable 2. Assess adherence to daily Iron therapy 3. Ensure pt had cardiology follow up.   4. Check BP as metoprolol was added 5. Ensure pt was able to get Vit D & calcium as pt has osteoporosis & is ambulatory but has dementia so high fall and fracture risk. Pt has contraindication to bisphosphonates  Follow-up Appointments: Follow-up Information    Follow up with [facility] on 08/06/2012. (with Dr. Sherrine Maples at 1015am on [phone] for repeat blood work and follow up bp and ekg if needed)    Contact information:   [address] [place] [address] [phone]        Discharge Orders    Future Appointments: Provider: Department: Dept Phone: Center:   08/06/2012 10:15 AM Genelle Gather, MD [facility] [phone] [facility]     Future Orders Please Complete By Expires   Diet - low sodium heart healthy      Increase activity slowly      Call MD for:  extreme fatigue  Call MD for:      Comments:   Notice any bleeding   Discharge instructions      Comments:   Follow up in opc in one month with Dr. Sherrine Maples at 1015am on 08/06/12  Will need to get follow up CBC done for hemoglobin check and HR & BP and EKG if necessary  Continue chronic PO iron therapy as prescribed and take Colace for constipation  Continue low dose metoprolol and stop all other previous blood pressure medications please.  Follow up with cardiology as needed  Continue PPI for one month  Please take calcium/vit D liquid--can be found otc  Discontinue tramadol   Consider Dexa scan on follow up appointment in [facility]     Consultations: GI and Cardiology Treatment Team:  Iva Boop, MD  Procedures Performed:  Dg Chest Port 1 View  07/12/2012  *RADIOLOGY REPORT*  Clinical Data: Shortness of breath.  PORTABLE CHEST - 1 VIEW  Comparison: 09/09/2010  Findings: There are patchy interstitial densities throughout both lungs.  There may be airspace disease in the left mid lung. Findings concerning  for pulmonary edema.  Increased lucency in the periphery of the left hemithorax may represent hyperinflation although the lucency appears to be lateral to the ribs.  Heart size is upper limits of normal.  IMPRESSION: Prominent interstitial lung markings with prominent perihilar structures.  Findings are suggestive for pulmonary edema.  Unusual lucency along the lateral left hemithorax as described.   Original Report Authenticated By: Richarda Overlie, M.D.    2D Echo: Transthoracic Echocardiography  Patient: Katherine, [name] MR #: [id number] Study Date: 07/13/2012 Gender: F Age: 64 Height: 167.6cm Weight: 60.8kg BSA: 1.66m^2 Pt. Status: Room: 3306  PERFORMING [facility] ADMITTING Blanch Media ATTENDING Lorenso Quarry, [facility] Blanch Media SONOGRAPHER [place], RDCS cc:  ------------------------------------------------------------ LV EF: 55% - 60%  ------------------------------------------------------------ Indications: MI - acute 410.91.  ------------------------------------------------------------ History: PMH: Transient ischemic attack. Risk factors: GI bleed. Vitamin B12 deficiency. Anemia. First degree heart block. Renal insufficiency. Left breast mass. Rheumatoid arthritis. Osteoporosis. Memory loss. Dementia. Former tobacco use. Hypertension. Dyslipidemia.  ------------------------------------------------------------ Study Conclusions  - Left ventricle: The cavity size was normal. There was moderate concentric hypertrophy. Systolic function was normal. The estimated ejection fraction was in the range of 55% to 60%. Possible hypokinesis of the basalinferior myocardium. Doppler parameters are consistent with abnormal left ventricular relaxation (grade 1 diastolic dysfunction). - Aortic valve: Mild to moderate regurgitation. - Mitral valve: Calcified annulus. Mildly thickened leaflets . Mild to moderate  regurgitation. - Pulmonary arteries: PA peak pressure: 67mm Hg (S). Transthoracic echocardiography. M-mode, complete 2D, spectral Doppler, and color Doppler. Height: Height: 167.6cm. Height: 66in. Weight: Weight: 60.8kg. Weight: 133.7lb. Body mass index: BMI: 21.6kg/m^2. Body surface area: BSA: 1.30m^2. Blood pressure: 120/61. Patient status: Inpatient. Location: ICU/CCU  ------------------------------------------------------------  ------------------------------------------------------------ Left ventricle: The cavity size was normal. There was moderate concentric hypertrophy. Systolic function was normal. The estimated ejection fraction was in the range of 55% to 60%. Regional wall motion abnormalities: Possible hypokinesis of the basalinferior myocardium. Doppler parameters are consistent with abnormal left ventricular relaxation (grade 1 diastolic dysfunction).  ------------------------------------------------------------ Aortic valve: Mildly thickened, mildly calcified leaflets. Cusp separation was normal. Doppler: Transvalvular velocity was within the normal range. There was no stenosis. Mild to moderate regurgitation.  ------------------------------------------------------------ Aorta: The aorta was normal, not dilated, and non-diseased.  ------------------------------------------------------------ Mitral valve: Calcified annulus. Mildly thickened leaflets . Leaflet separation was normal. Doppler: Transvalvular velocity was within the  normal range. There was no evidence for stenosis. Mild to moderate regurgitation.  ------------------------------------------------------------ Left atrium: The atrium was normal in size.  ------------------------------------------------------------ Right ventricle: The cavity size was normal. Wall thickness was normal. Systolic function was normal.  ------------------------------------------------------------ Pulmonic valve:  Structurally normal valve. Cusp separation was normal. Doppler: Transvalvular velocity was within the normal range. Trivial regurgitation.  ------------------------------------------------------------ Tricuspid valve: Structurally normal valve. Leaflet separation was normal. Doppler: Transvalvular velocity was within the normal range. Mild regurgitation.  ------------------------------------------------------------ Right atrium: The atrium was normal in size.  ------------------------------------------------------------ Pericardium: The pericardium was normal in appearance. There was no pericardial effusion.  ------------------------------------------------------------ Systemic veins: Inferior vena cava: The vessel was dilated; the respirophasic diameter changes were blunted (< 50%); findings are consistent with elevated central venous pressure.  ------------------------------------------------------------ Post procedure conclusions Ascending Aorta:  - The aorta was normal, not dilated, and non-diseased.  ------------------------------------------------------------  2D measurements Normal Doppler measurements Normal Left ventricle Main pulmonary LVID ED, 40.7 mm 43-52 artery chord, Pressure, S 67 mm =30 PLAX Hg LVID ES, 35.1 mm 23-38 Aortic valve chord, Regurg PHT 275 ms ------ PLAX Mitral valve FS, chord, 14 % >29 Peak E vel 55. cm/s ------ PLAX 3 LVPW, ED 13.2 mm ------ Peak A vel 99. cm/s ------ IVS/LVPW 1.25 <1.3 2 ratio, ED Deceleration 225 ms 150-23 Ventricular septum time 0 IVS, ED 16.5 mm ------ Peak E/A 0.6 ------ Aorta ratio Root diam, 34 mm ------ Tricuspid valve ED Regurg peak 341 cm/s ------ Left atrium vel AP dim 29 mm ------ Peak RV-RA 47 mm ------ AP dim 1.72 cm/m^2 <2.2 gradient, S Hg index Systemic veins Vol, S 90 ml ------ Estimated CVP 20 mm ------ Vol index, 53.3 ml/m^2 ------ Hg S Right ventricle Pressure, S 67 mm <30 Hg Sa vel, lat 11  cm/s ------ ann, tiss DP  ------------------------------------------------------------ Prepared and Electronically Authenticated by  Donato Schultz 2013-08-26T13:19:[id number]  ENDOSCOPY PROCEDURE REPORT  PATIENT: Calianna, Kim MR#: [id number]  BIRTHDATE: [birth year] , 89 yrs. old GENDER: Female  ENDOSCOPIST: Iva Boop, MD, [facility]  REFERRED BY:  PROCEDURE DATE: 07/15/2012  PROCEDURE: EGD w/ ablation  ASA CLASS: Class III  INDICATIONS: anemia secondary to chronic blood loss. AV  malformation.  MEDICATIONS: Fentanyl 50 mcg IV and Versed 4 mg IV  TOPICAL ANESTHETIC: none  DESCRIPTION OF PROCEDURE: After the risks benefits and alternatives  of the procedure were thoroughly explained, informed consent was  obtained. The Pentax Gastroscope [id number] endoscope was introduced  through the mouth and advanced to the second portion of the  duodenum. Without limitations. The instrument was slowly withdrawn  as the mucosa was fully examined.  STOMACH: A large bleeding angiodysplastic lesion was found in the  gastric antrum. Gastric antral vascular ectasia oozing blood  after contact. It was treated and ablated with APC setting 40 and  1L flow.  The remainder of the upper endoscopy exam was otherwise normal.  Retroflexed views revealed no abnormalities. The scope was then  withdrawn from the patient and the procedure completed.  COMPLICATIONS: There were no complications.  ENDOSCOPIC IMPRESSION:  1. Bleeding angiodysplastic lesion in the gastric antrum - GAVE -  oozing - ablated and treated with APC  2. The remainder of the upper endoscopy exam was otherwise normal  RECOMMENDATIONS:  1. continue PPI single dose daily for 1 month  2. daily chronic iron therapy DO NOT STOP  CBC monthly  retreat if cannot hold Hgb steady with iron therapy  eSigned: Iva Boop, MD, [facility]  07/15/2012 5:20 PM  Revised: 07/15/2012 5:20 PM   Admission HPI: Ms. 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 Ms. Schoene's daughter claims she was at church this morning when her husband called her and said Ms. Schellhorn was having difficulty breathing and he decided to call the ambulance. Ms. Garling has a hx of dementia but as per her daughter, she seems more confused lately. She is oriented to person and place, but not to time. Ms. Corne daughter reports her to have a nonproductive cough, headaches, weakness, less active x3 weeks, and reports complaints of of intermittent chest pain x1 month with most recent chest pain 3 days ago relieved with Nitro. She reports her to have a good appetite and denies her to have any nausea, vomiting, hematemesis, diarrhea, constipation, dysuria, abdominal pain, or bleeding per rectum.  In the ED, Ms Such was found to have Hb of 5.3, heme positive stool, and ProBNP=7255 with troponin of 0.81 and EKG significant for ST depression mainly in leads II, III, avf, V5 and V6. She was noted to wet the bed as per daughter which she has apparently never done before.  Of note, prior records from October 2011 report similar admission with complaints of chest pain, shortness of breath, weakness, and Hb of 5.2. EGD (09/10/12) done during that visit revealed GAVE in antrum, hiatal hernia, and a benign ring-like large caliber stricture in distal esophagus He also had an EGD and colonoscopy in September 2010 significant for chronic gastritis, negative H pylori, and severe diverticulosis of colon. During that visit, she was transfused with 4 units of PRBCs. Ms. Costabile is followed by Dr. Eben Burow in [facility].   Hospital Course by problem list:   NSTEMI (non-ST elevated myocardial infarction)--Presented with hx of intermittent chest pain x1 month as per daughter with most recent episode of  chest pain noted to be three days prior to admission and relieved with nitroglycerin.  Ms. Desanctis was found to have a Hb of 5.3 on admission with heme positive stool and ProBNP of 7255 with elevated troponin of 0.81 up to 5.46 and trending down to 3.93 on 07/13/12.  Ms. Vangorder was evaluated by cardiology, rulled in for an infarct through admission, however in the setting of significant anemia and active GIB, not a candidate for anticoagulation or invasive cardiac evaluation.  Conservative therapy was recommended with low dose beta blockers and transfusion of hemoglobin to 10 if possible.   Echocardiogram done with EF of 55-60% with possible hypokinesis of basalinferior myocardium and grade 1 diastolic dysfunction along with mild to moderate regurgitation of aortic and mitral valve.  NSTEMI type II thought to be most likely secondary to demand ischemia in the setting of severe anemia secondary to GIB.  Continuing conservative management at this time and will need to follow up with PCP and possibly cardiology as outpatient.  We have scheduled a follow up appointment in [facility] in one month.     Iron deficiency anemia secondary to blood loss (chronic)--Hx of GAVE (gastric antral vascular ectasia) in October 2011 with similar presentation as this admission with Hb of 5.2, was told to be on chronic iron therapy at that time--unclear if that was adhered too as per daughter.  Found to have heme positive stool on admission.  Consulted by GI.  Hb of 5.3 this admission.  Transfused total of 4 units PRBC during admission, parenteral Iron x1, daily ferrous sulfate tablet 325mg  started during admission and to be continued daily with monthly CBC checks.  Hb s/p last transfusion 10.2, stable, Hb 9.4 up to discharge.  Will need to follow up with PCP regularly with monthly CBC checks and adherence to chronic iron therapy.  Ms. Fomby has also been prescribed colace to accompany iron therapy for possible constipation and we have also  recommended daily calcium and vitamin D to be taken separately from iron.  We have scheduled Ms. Campa for follow up in [facility] in one month.  She has a hx of no-show's in clinic, so we have discussed with family and hope that she will follow up accordingly and have also stressed importance of supervision for Ms. Quickel as she appears to be at falls risk with increasing dementia over time.  Case management is also on board and home health PT will likely be set up for further evaluation.     Gastric antral vascular ectasia (watermelon stomach)--Hx of GAVE in October 2011.  Presented with Hb 5.3 on admission, transfused total of 4 units of PRBCs.  GI consulted.  EGD done 07/15/12 showing bleeding  angiodysplastic lesion in the gastric antrum--GAVE--oozine that was ablated and treated with APC with remainder of upper endoscopy exam otherwise normal.  GI recommends continuous PPI single dose daily for 1 month and daily chronic iron therapy with monthly CBC and re-treat if cannot hold Hb steady with iron therapy.  Hb stable 9.4 on discharge.  Will need to be followed up with PCP and possibly GI as an outpatient. We have scheduled Ms. Studley for follow up in [facility] in one month.  She has a hx of no-show's in clinic, so we have discussed with family and hope that she will follow up accordingly.  Case management is also on board and home health PT will likely be set up for further evaluation.    Discharge Vitals:  BP 140/86  Pulse 80  Temp 97.4 F (36.3 C) (Axillary)  Resp 20  Ht 5\' 6"  (1.676 m)  Wt 134 lb 0.6 oz (60.8 kg)  BMI 21.63 kg/m2  SpO2 98%  Discharge Labs:  Results for orders placed during the hospital encounter of 07/12/12 (from the past 24 hour(s))  CBC     Status: Abnormal   Collection Time   07/16/12  5:24 AM      Component Value Range   WBC 8.1  4.0 - 10.5 K/uL   RBC 3.95  3.87 - 5.11 MIL/uL   Hemoglobin 9.4 (*) 12.0 - 15.0 g/dL   HCT 16.1 (*) 09.6 - 04.5 %   MCV 79.0  78.0 - 100.0 fL   MCH  23.8 (*) 26.0 - 34.0 pg   MCHC 30.1  30.0 - 36.0 g/dL   RDW 40.9 (*) 81.1 - 91.4 %   Platelets 204  150 - 400 K/uL   Signed: Darden Palmer 07/16/2012, 2:03 PM   Time Spent on Discharge: >30 minutes

## 2012-07-15 NOTE — Progress Notes (Signed)
Subjective: Ms. Lafalce was seen and examined at bedside while her family was in the room as well.  Ms. Madaris was sleeping but I was able to speak to the daughter and she informed me that she spoke to GI and consented for Ms. Franqui to go for EGD later today.  They report her to be comfortable.  When Ms. Whitehorn woke up, she was alert and oriented to person and place, laughed a little, and denied any headaches, nausea, vomiting, diarrhea, chest pain, or shortness of breath at this time.  She is noted to get increasingly confused during the evening that eventually resolves.  Her daughter claims that is what happens at home as well.  Ms. Haros will continue to follow in [facility] as per her daughter.  Objective: Vital signs in last 24 hours: Filed Vitals:   07/14/12 1100 07/14/12 1300 07/14/12 1943 07/15/12 0500  BP: 131/71 118/72 128/75 140/77  Pulse:  79 74 85  Temp: 98.2 F (36.8 C) 98.2 F (36.8 C) 97.8 F (36.6 C) 98.2 F (36.8 C)  TempSrc: Oral Oral Oral Oral  Resp:  22 21   Height:      Weight:      SpO2:  100% 100% 100%   Weight change:   Intake/Output Summary (Last 24 hours) at 07/15/12 0724 Last data filed at 07/14/12 1100  Gross per 24 hour  Intake      0 ml  Output    200 ml  Net   -200 ml   Physical Exam:   Vitals reviewed.  General: resting in bed, NAD, occasionally laughing HEENT: PERRLA, EOMI  Cardiac: RRR no rubs, or gallops  Pulm: clear to auscultation bilaterally, no wheezes, rales, or rhonchi  Abd: soft, nontender, nondistended, +BS present, + periumbilical scar with folded skin  Ext: warm and well perfused, no pedal edema, scds in place, +long fingernails Neuro: alert and oriented X2 to person and place, not to time, cranial nerves II-XII grossly intact, strength and sensation to light touch equal in bilateral lower extremities.  Lab Results: Basic Metabolic Panel:  Lab 07/14/12 4782 07/13/12 0445  NA 141 140  K 4.0 4.2  CL 109 109  CO2 21 20  GLUCOSE 98  112*  BUN 31* 27*  CREATININE 1.32* 1.26*  CALCIUM 9.1 8.8  MG -- --  PHOS -- --   Liver Function Tests:  Lab 07/14/12 0608 07/13/12 0445  AST 111* 75*  ALT 82* 50*  ALKPHOS 87 73  BILITOT 2.2* 1.9*  PROT 6.5 6.3  ALBUMIN 2.8* 2.8*   CBC:  Lab 07/14/12 2023 07/14/12 1035  WBC 9.6 8.4  NEUTROABS -- --  HGB 9.7* 9.6*  HCT 31.5* 30.7*  MCV 77.8* 76.4*  PLT 203 193   Cardiac Enzymes:  Lab 07/13/12 1751 07/13/12 0418 07/12/12 1302  CKTOTAL 358* 325* --  CKMB 11.3* 16.7* --  CKMBINDEX -- -- --  TROPONINI 3.93* 5.46* 0.81*   BNP:  Lab 07/12/12 1302  PROBNP 7255.0*   D-Dimer:  Lab 07/12/12 1301  DDIMER 14.25*   Coagulation:  Lab 07/12/12 1301  LABPROT 13.6  INR 1.02   Anemia Panel:  Lab 07/12/12 1404  VITAMINB12 360  FOLATE >20.0  FERRITIN 4*  TIBC Not calculated due to Iron <10.  IRON <10*  RETICCTPCT 1.6   Urine Drug Screen: Drugs of Abuse     Component Value Date/Time   LABOPIA NONE DETECTED 11/22/2007 1900   COCAINSCRNUR NONE DETECTED 11/22/2007 1900   LABBENZ  NONE DETECTED 11/22/2007 1900   AMPHETMU NONE DETECTED 11/22/2007 1900   THCU NONE DETECTED 11/22/2007 1900   LABBARB  Value: NONE DETECTED        DRUG SCREEN FOR MEDICAL PURPOSES ONLY.  IF CONFIRMATION IS NEEDED FOR ANY PURPOSE, NOTIFY LAB WITHIN 5 DAYS. 11/22/2007 1900    Urinalysis:  Lab 07/12/12 1606  COLORURINE YELLOW  LABSPEC 1.020  PHURINE 5.0  GLUCOSEU NEGATIVE  HGBUR NEGATIVE  BILIRUBINUR NEGATIVE  KETONESUR NEGATIVE  PROTEINUR 30*  UROBILINOGEN 1.0  NITRITE NEGATIVE  LEUKOCYTESUR NEGATIVE   Micro Results: Recent Results (from the past 240 hour(s))  MRSA PCR SCREENING     Status: Normal   Collection Time   07/12/12  5:42 PM      Component Value Range Status Comment   MRSA by PCR NEGATIVE  NEGATIVE Final    Studies/Results: Transthoracic Echocardiography  Patient: Breigh, Annett MR #: [id number] Study Date: 07/13/2012 Gender: F Age: 78 Height: 167.6cm Weight:  60.8kg BSA: 1.50m^2 Pt. Status: Room: 3306  PERFORMING  Cardiology, Ec ADMITTING Blanch Media ATTENDING Lorenso Quarry, [facility] Blanch Media SONOGRAPHER [place], RDCS cc:  ------------------------------------------------------------ LV EF: 55% - 60%  ------------------------------------------------------------ Indications: MI - acute 410.91.  ------------------------------------------------------------ History: PMH: Transient ischemic attack. Risk factors: GI bleed. Vitamin B12 deficiency. Anemia. First degree heart block. Renal insufficiency. Left breast mass. Rheumatoid arthritis. Osteoporosis. Memory loss. Dementia. Former tobacco use. Hypertension. Dyslipidemia.  ------------------------------------------------------------ Study Conclusions  - Left ventricle: The cavity size was normal. There was moderate concentric hypertrophy. Systolic function was normal. The estimated ejection fraction was in the range of 55% to 60%. Possible hypokinesis of the basalinferior myocardium. Doppler parameters are consistent with abnormal left ventricular relaxation (grade 1 diastolic dysfunction). - Aortic valve: Mild to moderate regurgitation. - Mitral valve: Calcified annulus. Mildly thickened leaflets . Mild to moderate regurgitation. - Pulmonary arteries: PA peak pressure: 67mm Hg (S). Transthoracic echocardiography. M-mode, complete 2D, spectral Doppler, and color Doppler. Height: Height: 167.6cm. Height: 66in. Weight: Weight: 60.8kg. Weight: 133.7lb. Body mass index: BMI: 21.6kg/m^2. Body surface area: BSA: 1.33m^2. Blood pressure: 120/61. Patient status: Inpatient. Location: ICU/CCU  ------------------------------------------------------------  ------------------------------------------------------------ Left ventricle: The cavity size was normal. There was moderate concentric hypertrophy. Systolic function  was normal. The estimated ejection fraction was in the range of 55% to 60%. Regional wall motion abnormalities: Possible hypokinesis of the basalinferior myocardium. Doppler parameters are consistent with abnormal left ventricular relaxation (grade 1 diastolic dysfunction).  ------------------------------------------------------------ Aortic valve: Mildly thickened, mildly calcified leaflets. Cusp separation was normal. Doppler: Transvalvular velocity was within the normal range. There was no stenosis. Mild to moderate regurgitation.  ------------------------------------------------------------ Aorta: The aorta was normal, not dilated, and non-diseased.  ------------------------------------------------------------ Mitral valve: Calcified annulus. Mildly thickened leaflets . Leaflet separation was normal. Doppler: Transvalvular velocity was within the normal range. There was no evidence for stenosis. Mild to moderate regurgitation.  ------------------------------------------------------------ Left atrium: The atrium was normal in size.  ------------------------------------------------------------ Right ventricle: The cavity size was normal. Wall thickness was normal. Systolic function was normal.  ------------------------------------------------------------ Pulmonic valve: Structurally normal valve. Cusp separation was normal. Doppler: Transvalvular velocity was within the normal range. Trivial regurgitation.  ------------------------------------------------------------ Tricuspid valve: Structurally normal valve. Leaflet separation was normal. Doppler: Transvalvular velocity was within the normal range. Mild regurgitation.  ------------------------------------------------------------ Right atrium: The atrium was normal in size.  ------------------------------------------------------------ Pericardium: The pericardium was normal in appearance. There was no pericardial  effusion.  ------------------------------------------------------------ Systemic veins: Inferior vena cava: The vessel was dilated;  the respirophasic diameter changes were blunted (< 50%); findings are consistent with elevated central venous pressure.  ------------------------------------------------------------ Post procedure conclusions Ascending Aorta:  - The aorta was normal, not dilated, and non-diseased.  ------------------------------------------------------------  2D measurements Normal Doppler measurements Normal Left ventricle Main pulmonary LVID ED, 40.7 mm 43-52 artery chord, Pressure, S 67 mm =30 PLAX Hg LVID ES, 35.1 mm 23-38 Aortic valve chord, Regurg PHT 275 ms ------ PLAX Mitral valve FS, chord, 14 % >29 Peak E vel 55. cm/s ------ PLAX 3 LVPW, ED 13.2 mm ------ Peak A vel 99. cm/s ------ IVS/LVPW 1.25 <1.3 2 ratio, ED Deceleration 225 ms 150-23 Ventricular septum time 0 IVS, ED 16.5 mm ------ Peak E/A 0.6 ------ Aorta ratio Root diam, 34 mm ------ Tricuspid valve ED Regurg peak 341 cm/s ------ Left atrium vel AP dim 29 mm ------ Peak RV-RA 47 mm ------ AP dim 1.72 cm/m^2 <2.2 gradient, S Hg index Systemic veins Vol, S 90 ml ------ Estimated CVP 20 mm ------ Vol index, 53.3 ml/m^2 ------ Hg S Right ventricle Pressure, S 67 mm <30 Hg Sa vel, lat 11 cm/s ------ ann, tiss DP  ------------------------------------------------------------ Prepared and Electronically Authenticated by  Donato Schultz 2013-08-26T13:19:[id number]  Medications: I have reviewed the patient's current medications. Scheduled Meds:    . donepezil  10 mg Oral QHS  . ferrous sulfate  325 mg Oral BID WC  . haloperidol  1 mg Oral Once   Or  . haloperidol lactate  1 mg Intramuscular Once  . metoprolol tartrate  12.5 mg Oral BID  . pantoprazole  40 mg Oral Q0600  . sodium chloride  3 mL Intravenous Q12H   Continuous Infusions:    . sodium chloride 10 mL/hr at 07/13/12 0047  .  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  # GI bleed: Presents with complaints of new onset shortness of breath. She is not a great historian with her dementia -the history was obtained by daughter. On exam, she was found to have heme positive stools and hemoglobin was 5.3 . Given her history of GAVE disease on endoscopy in 10/11, this is most likely recurrence of GAVE disease. Other possibility includes AVM's vs erosive gastritis vs PUD vs dileuafoy's lesion vs. malignancy  - CBC q12h.  - Transfused 4 units  of PRBC since admission;Cardio recommended Hb to be transfused to 10 if possible  - Morning Hb:9.5 - Hold BP meds.  - GI following: EGD today  - chronic po iron - Parenteral Iron infusion as per GI recommendations and dose per pharmacy - Protonix   # NSTEMI type 2: likely 2/2 demand ischemia from her anemia in the setting of GI bleed.  Daughter states she has been reporting on and off chest pain for last 1 month, 3 days ago cp relieved with nitro . She became SOB morning of admission when EMS was called. Her POC troponin was elevated to 0.81 and her D- dimer was elevated. Her EKG on admission showed ST depression in leads II, III, avF, V4, V5 and V6.  Not a candidate for cardiology procedures, treating anemia is treatment of choice.  - Continue to monitor for now.  - CE--Troponin down to 3.93 - Cardiology following: NSTEMI--ruled  in for infarct, but conservative therapy in setting of significant anemia 2/2 GIB, hold all anticoagulation at this time, no ASA, heparin, or plavix, not a candidate for invasive cardiac evaluation.   -  Continue low dose metoprolol but follow for bradycardia.  - f/u echo--EF 55-60%. Moderate concentric hypertrophy, grade 1 diastolic dysfunction, mild to moderate AR, calcified mitral valve with mild to moderate regurge, PA peak pressure .  - PRN NTG ( Hold if SBP <77mm Hg)  - do not start statin at this time - hold ASA  #Tachycardia: likely 2/2 anemia causing high output state. Her D- dimer is elevated but this could be likely 2/2 to her volume overload/congestion with anemia. PE is low probability with her WELL's score of 2 (given her history of breast cancer and tachycardia) and modified Geneva score of 6. Her elevated D- dimer could be due to age, history of malignancy, and RA. She is not a candidate for anticoagulation at this time.  - Stable - Monitor vitals for now.   # Elevated Pro- BNP : ?could be 2/2 anemia causing some vascular congestion. She was found to have increased interstial markings on her CXR. Her last echo in 2009 showed EF of 70% with no diastolic dysfunction. Anemia causes activation of the baroreceptors (as it does in low-output CHF), which, in turn, activates the sympathetic, renin-angiotensin-aldosterone system and vasopressin systems, causing tachycardia and peripheral vasoconstriction and a decrease in renal blood flow and glomerular filtration rate. This leads to salt and water retention and an increase in extracellular and plasma volume.  - [id number]  - 2D echo--grade 1 diastolic dysfunction, EF 55-60%  - Cardiology following--continue low dose metoprolol  # Dementia: worse than baseline as reported by daughter likely in the setting of acute illness.  - continue to monitor mental status  - More calm today after being agitated yesterday  # HTN: BP stable.  -Hold BP meds in the setting of GI bleed.   # Hx of breast cancer; high-grade intraductal carcinoma, status post lumpectomy and radiation therapy in February 2000.  -stable  # Hx of RA: stable.  -does  not meet criteria for bisphosphonates with CrCl:29  DVT: SCD's  Diet: Heart Healthy Dispo: pending clinical improvement, stable Hb/Hct, GI consultation  Daughter's phone numbers: [phone] [phone]   LOS: 3 days   Darden Palmer 07/15/2012, 7:24 AM

## 2012-07-16 ENCOUNTER — Encounter (HOSPITAL_COMMUNITY): Payer: Self-pay | Admitting: Internal Medicine

## 2012-07-16 DIAGNOSIS — K31819 Angiodysplasia of stomach and duodenum without bleeding: Secondary | ICD-10-CM

## 2012-07-16 LAB — CBC
MCH: 23.8 pg — ABNORMAL LOW (ref 26.0–34.0)
MCHC: 30.1 g/dL (ref 30.0–36.0)
MCV: 79 fL (ref 78.0–100.0)
Platelets: 204 10*3/uL (ref 150–400)
RBC: 3.95 MIL/uL (ref 3.87–5.11)
RDW: 21.7 % — ABNORMAL HIGH (ref 11.5–15.5)

## 2012-07-16 MED ORDER — FERROUS SULFATE 325 (65 FE) MG PO TABS: 325.0000 mg | ORAL_TABLET | Freq: Two times a day (BID) | ORAL | Status: AC

## 2012-07-16 MED ORDER — CALCIUM & VIT D3 BONE HEALTH PO LIQD: 500.0000 mg | Freq: Two times a day (BID) | ORAL | Status: DC

## 2012-07-16 MED ORDER — PANTOPRAZOLE SODIUM 40 MG PO TBEC: 40.0000 mg | DELAYED_RELEASE_TABLET | Freq: Every day | ORAL | Status: DC

## 2012-07-16 MED ORDER — METOPROLOL TARTRATE 12.5 MG HALF TABLET: 12.5000 mg | ORAL_TABLET | Freq: Two times a day (BID) | ORAL | Status: DC

## 2012-07-16 MED ORDER — CALCIUM 600-400 MG-UNIT PO CHEW: 600.0000 mg | CHEWABLE_TABLET | Freq: Two times a day (BID) | ORAL | Status: DC

## 2012-07-16 MED ORDER — DOCUSATE SODIUM 100 MG PO CAPS: 100.0000 mg | ORAL_CAPSULE | Freq: Two times a day (BID) | ORAL | Status: AC

## 2012-07-16 NOTE — Progress Notes (Signed)
Pt was discharged home with Home Health services. The patient's daughter Christen Butter was given the discharge packet and information on when to follow-up, the pt's next apptmt, information on GI bleeds, Meds, & etc. Pt's daughter stated she understood the information and that she had no further questions. VS were checked and are charted. IV was removed. Pt's belongings were taken home with the pt. Sanda Linger

## 2012-07-16 NOTE — Progress Notes (Signed)
Internal Medicine Teaching Service Attending Note Date: 07/16/2012  Patient name: Katherine [name]  Medical record number: [id number]  Date of birth: 31-May-1923    This patient has been seen and discussed with the house staff. Please see their note for complete details. I concur with their findings with the following additions/corrections: Ms Bluett was seen on AM rounds. She was calm, sitting side of bed working on some sort of craft project. Denied pain. Stable for D/C home with freq CBC's. PPI for one month.  Safiyah Cisney 07/16/2012, 12:42 PM

## 2012-07-16 NOTE — Plan of Care (Signed)
Problem: Consults Goal: GI Bleeding Patient Education See Patient Education Module for education specifics.  Outcome: Completed/Met Date Met:  07/16/12 Education given to the pt's daughter; Handout given  Problem: Phase I Progression Outcomes Goal: OOB as tolerated unless otherwise ordered Outcome: Adequate for Discharge Pt moving at baseline Goal: Initial discharge plan identified Outcome: Completed/Met Date Met:  07/16/12 Pt is being discharged with home health Goal: Hemodynamically stable Outcome: Completed/Met Date Met:  07/16/12 Pt is stable  Problem: Phase III Progression Outcomes Goal: Discharge plan remains appropriate-arrangements made Outcome: Adequate for Discharge Pt is being discharged with Home Health  Problem: Discharge Progression Outcomes Goal: Discharge plan in place and appropriate Outcome: Adequate for Discharge Pt being discharged with Home health & education given to daughter Goal: Activity appropriate for discharge plan Outcome: Adequate for Discharge Pt being discharged with Home Health

## 2012-07-16 NOTE — Care Management Note (Signed)
    Page 1 of 2   07/16/2012     4:17:21 PM   CARE MANAGEMENT NOTE 07/16/2012  Patient:  Katherine [name], Katherine [name]   Account Number:  [id number]  Date Initiated:  07/14/2012  Documentation initiated by:  Donn Pierini  Subjective/Objective Assessment:   Pt admitted with GIB, anemia, NSTEMI     Action/Plan:   PTA pt lived at home with daughter   Anticipated DC Date:  07/17/2012   Anticipated DC Plan:  HOME W HOME HEALTH SERVICES      DC Planning Services  CM consult      [facility] Choice  HOME HEALTH   Choice offered to / List presented to:  C-4 Adult Children        HH arranged  HH-1 RN  HH-10 DISEASE MANAGEMENT  HH-2 PT  HH-4 NURSE'S AIDE  HH-6 SOCIAL WORKER      HH agency  [facility]   Status of service:  Completed, signed off Medicare Important Message given?   (If response is "NO", the following Medicare IM given date fields will be blank) Date Medicare IM given:   Date Additional Medicare IM given:    Discharge Disposition:  HOME W HOME HEALTH SERVICES  Per UR Regulation:  Reviewed for med. necessity/level of care/duration of stay  If discussed at Long Length of Stay Meetings, dates discussed:    Comments:  PCP-   Lars Mage  07-16-12 [address], [place] [address] CM tried to contact daughter Misty Stanley for [facility] servicies. Pt is not able to tell me if she has DME at home. CM wanted to speak to daughter but was unale to. CM did call several times and left vm and was unable to contact. CM will make referral for services listed above. Referral sent to [facility]. SOC to begin within 24-48 hours post d/c.

## 2012-07-16 NOTE — Progress Notes (Signed)
Thank you to Gerrianne Scale RN CM for this referral.  Chart review complete.  Patient is not eligible for services because [facility] Care Management is not in network with [facility] HMO.   For any additional questions or new referrals please contact Anibal Henderson BSN RN [facility] Liaison at [phone].

## 2012-07-16 NOTE — Progress Notes (Signed)
[facility] Progress Note  SUBJECTIVE: confused  OBJECTIVE:  Vital signs in last 24 hours: Temp:  [97.1 F (36.2 C)-98.3 F (36.8 C)] 98 F (36.7 C) (08/29 0527) Pulse Rate:  [70-96] 70  (08/29 0527) Resp:  [14-53] 18  (08/29 0527) BP: (107-156)/(57-96) 109/59 mmHg (08/29 0527) SpO2:  [94 %-100 %] 98 % (08/29 0527) Last BM Date: 07/13/12 General:    Black female in NAD Heart:  Regular rate and rhythm Lungs: Respirations even and unlabored Abdomen:  Soft, nontender and nondistended. Normal bowel sounds. .Neurologic:  Alert, confused.  Psych:  Cooperative.    Lab Results:  Basename 07/16/12 0524 07/15/12 0927 07/14/12 2023 07/14/12 1035  WBC 8.1 -- 9.6 8.4  HGB 9.4* 9.5* 9.7* --  HCT 31.2* 30.8* 31.5* --  PLT 204 -- 203 193   BMET  Basename 07/15/12 0927 07/14/12 0608  NA 141 141  K 3.9 4.0  CL 109 109  CO2 22 21  GLUCOSE 121* 98  BUN 27* 31*  CREATININE 1.25* 1.32*  CALCIUM 9.1 9.1   LFT  Basename 07/15/12 0927  PROT 6.4  ALBUMIN 2.8*  AST 75*  ALT 73*  ALKPHOS 83  BILITOT 1.4*  BILIDIR --  IBILI --    ASSESSMENT / PLAN:  1. GIB (occult) , s/p EGD with ablation of oozing of large angiodyplastic leson (GAVE).   2. Profond iron deficiency anemia related to #1. Hemooglobin up to 9.4 and stable after several units of blood. She will need to continue outpatient iron.   3.  NSTEMI in setting of severe anemia. Cardiology recommends conservative management given at this point.   4. Dementia    LOS: 4 days   Katherine [name]  07/16/2012, 9:20 AM  [facility] Attending  I have also seen and assessed the patient and agree with the above note. Signing off GI follow-up as needed Continue iron - see my recommendations on EGD report  Katherine Boop, MD, [facility] [phone] (pager) 07/16/2012 3:10 PM

## 2012-07-16 NOTE — Progress Notes (Signed)
Subjective: Katherine [name] was seen and examined at bedside this morning.  She is alert and oriented to person and place and is asking to go home.  She went for EGD yesterday where GAVE was reconfirmed, bleeding site on antrum was ablated, and hemoglobin has been stable.  Katherine [name] will need to follow up with PCP in [facility] monthly for hemoglobin checks and continue chronic PO Iron therapy.  I will discuss this again in detail with her daughter when she arrives.  Katherine [name] denies any nausea, vomiting, diarrhea, abdominal pain, chest pain or shortness of breath at this time.     Objective: Vital signs in last 24 hours: Filed Vitals:   07/15/12 1800 07/15/12 1809 07/15/12 2049 07/16/12 0527  BP: 115/68 115/68 127/72 109/59  Pulse:   89 70  Temp:   98.3 F (36.8 C) 98 F (36.7 C)  TempSrc:   Axillary Axillary  Resp: 26 30 17 18   Height:      Weight:      SpO2: 95% 96% 98% 98%   Weight change:   Intake/Output Summary (Last 24 hours) at 07/16/12 1012 Last data filed at 07/16/12 0818  Gross per 24 hour  Intake    490 ml  Output    600 ml  Net   -110 ml   Physical Exam:   Vitals reviewed.  General: resting in bed, NAD, occasionally laughing HEENT: PERRLA, EOMI  Cardiac: RRR no rubs, or gallops  Pulm: clear to auscultation bilaterally, no wheezes, rales, or rhonchi  Abd: soft, nontender, nondistended, +BS present, + periumbilical scar with folded skin  Ext: warm and well perfused, no pedal edema, scds in place, +long fingernails Neuro: alert and oriented X2 to person and place, not to time, cranial nerves II-XII grossly intact, strength and sensation to light touch equal in bilateral lower extremities.  Lab Results: Basic Metabolic Panel:  Lab 07/15/12 1610 07/14/12 0608  NA 141 141  K 3.9 4.0  CL 109 109  CO2 22 21  GLUCOSE 121* 98  BUN 27* 31*  CREATININE 1.25* 1.32*  CALCIUM 9.1 9.1  MG -- --  PHOS -- --   Liver Function Tests:  Lab 07/15/12 0927 07/14/12 0608  AST 75*  111*  ALT 73* 82*  ALKPHOS 83 87  BILITOT 1.4* 2.2*  PROT 6.4 6.5  ALBUMIN 2.8* 2.8*   CBC:  Lab 07/16/12 0524 07/15/12 0927 07/14/12 2023  WBC 8.1 -- 9.6  NEUTROABS -- -- --  HGB 9.4* 9.5* --  HCT 31.2* 30.8* --  MCV 79.0 -- 77.8*  PLT 204 -- 203   Cardiac Enzymes:  Lab 07/13/12 1751 07/13/12 0418 07/12/12 1302  CKTOTAL 358* 325* --  CKMB 11.3* 16.7* --  CKMBINDEX -- -- --  TROPONINI 3.93* 5.46* 0.81*   BNP:  Lab 07/12/12 1302  PROBNP 7255.0*   D-Dimer:  Lab 07/12/12 1301  DDIMER 14.25*   Coagulation:  Lab 07/12/12 1301  LABPROT 13.6  INR 1.02   Anemia Panel:  Lab 07/12/12 1404  VITAMINB12 360  FOLATE >20.0  FERRITIN 4*  TIBC Not calculated due to Iron <10.  IRON <10*  RETICCTPCT 1.6   Urine Drug Screen: Drugs of Abuse     Component Value Date/Time   LABOPIA NONE DETECTED 11/22/2007 1900   COCAINSCRNUR NONE DETECTED 11/22/2007 1900   LABBENZ NONE DETECTED 11/22/2007 1900   AMPHETMU NONE DETECTED 11/22/2007 1900   THCU NONE DETECTED 11/22/2007 1900   LABBARB  Value: NONE DETECTED  DRUG SCREEN FOR MEDICAL PURPOSES ONLY.  IF CONFIRMATION IS NEEDED FOR ANY PURPOSE, NOTIFY LAB WITHIN 5 DAYS. 11/22/2007 1900    Urinalysis:  Lab 07/12/12 1606  COLORURINE YELLOW  LABSPEC 1.020  PHURINE 5.0  GLUCOSEU NEGATIVE  HGBUR NEGATIVE  BILIRUBINUR NEGATIVE  KETONESUR NEGATIVE  PROTEINUR 30*  UROBILINOGEN 1.0  NITRITE NEGATIVE  LEUKOCYTESUR NEGATIVE   Micro Results: Recent Results (from the past 240 hour(s))  MRSA PCR SCREENING     Status: Normal   Collection Time   07/12/12  5:42 PM      Component Value Range Status Comment   MRSA by PCR NEGATIVE  NEGATIVE Final    Studies/Results: Transthoracic Echocardiography  Patient: Katherine, [name] MR #: [id number] Study Date: 07/13/2012 Gender: F Age: 78 Height: 167.6cm Weight: 60.8kg BSA: 1.95m^2 Pt. Status: Room: 3306  PERFORMING [facility] ADMITTING Blanch Media ATTENDING Lorenso Quarry, [facility] Blanch Media SONOGRAPHER Dobson, RDCS cc:  ------------------------------------------------------------ LV EF: 55% - 60%  ------------------------------------------------------------ Indications: MI - acute 410.91.  ------------------------------------------------------------ History: PMH: Transient ischemic attack. Risk factors: GI bleed. Vitamin B12 deficiency. Anemia. First degree heart block. Renal insufficiency. Left breast mass. Rheumatoid arthritis. Osteoporosis. Memory loss. Dementia. Former tobacco use. Hypertension. Dyslipidemia.  ------------------------------------------------------------ Study Conclusions  - Left ventricle: The cavity size was normal. There was moderate concentric hypertrophy. Systolic function was normal. The estimated ejection fraction was in the range of 55% to 60%. Possible hypokinesis of the basalinferior myocardium. Doppler parameters are consistent with abnormal left ventricular relaxation (grade 1 diastolic dysfunction). - Aortic valve: Mild to moderate regurgitation. - Mitral valve: Calcified annulus. Mildly thickened leaflets . Mild to moderate regurgitation. - Pulmonary arteries: PA peak pressure: 67mm Hg (S). Transthoracic echocardiography. M-mode, complete 2D, spectral Doppler, and color Doppler. Height: Height: 167.6cm. Height: 66in. Weight: Weight: 60.8kg. Weight: 133.7lb. Body mass index: BMI: 21.6kg/m^2. Body surface area: BSA: 1.33m^2. Blood pressure: 120/61. Patient status: Inpatient. Location: ICU/CCU  ------------------------------------------------------------  ------------------------------------------------------------ Left ventricle: The cavity size was normal. There was moderate concentric hypertrophy. Systolic function was normal. The estimated ejection fraction was in the range of 55% to 60%. Regional wall motion abnormalities: Possible hypokinesis of the  basalinferior myocardium. Doppler parameters are consistent with abnormal left ventricular relaxation (grade 1 diastolic dysfunction).  ------------------------------------------------------------ Aortic valve: Mildly thickened, mildly calcified leaflets. Cusp separation was normal. Doppler: Transvalvular velocity was within the normal range. There was no stenosis. Mild to moderate regurgitation.  ------------------------------------------------------------ Aorta: The aorta was normal, not dilated, and non-diseased.  ------------------------------------------------------------ Mitral valve: Calcified annulus. Mildly thickened leaflets . Leaflet separation was normal. Doppler: Transvalvular velocity was within the normal range. There was no evidence for stenosis. Mild to moderate regurgitation.  ------------------------------------------------------------ Left atrium: The atrium was normal in size.  ------------------------------------------------------------ Right ventricle: The cavity size was normal. Wall thickness was normal. Systolic function was normal.  ------------------------------------------------------------ Pulmonic valve: Structurally normal valve. Cusp separation was normal. Doppler: Transvalvular velocity was within the normal range. Trivial regurgitation.  ------------------------------------------------------------ Tricuspid valve: Structurally normal valve. Leaflet separation was normal. Doppler: Transvalvular velocity was within the normal range. Mild regurgitation.  ------------------------------------------------------------ Right atrium: The atrium was normal in size.  ------------------------------------------------------------ Pericardium: The pericardium was normal in appearance. There was no pericardial effusion.  ------------------------------------------------------------ Systemic veins: Inferior vena cava: The vessel was dilated;  the respirophasic diameter changes were blunted (< 50%); findings are consistent with elevated central venous pressure.  ------------------------------------------------------------ Post procedure conclusions Ascending Aorta:  - The aorta was normal, not dilated, and  non-diseased.  ------------------------------------------------------------  2D measurements Normal Doppler measurements Normal Left ventricle Main pulmonary LVID ED, 40.7 mm 43-52 artery chord, Pressure, S 67 mm =30 PLAX Hg LVID ES, 35.1 mm 23-38 Aortic valve chord, Regurg PHT 275 ms ------ PLAX Mitral valve FS, chord, 14 % >29 Peak E vel 55. cm/s ------ PLAX 3 LVPW, ED 13.2 mm ------ Peak A vel 99. cm/s ------ IVS/LVPW 1.25 <1.3 2 ratio, ED Deceleration 225 ms 150-23 Ventricular septum time 0 IVS, ED 16.5 mm ------ Peak E/A 0.6 ------ Aorta ratio Root diam, 34 mm ------ Tricuspid valve ED Regurg peak 341 cm/s ------ Left atrium vel AP dim 29 mm ------ Peak RV-RA 47 mm ------ AP dim 1.72 cm/m^2 <2.2 gradient, S Hg index Systemic veins Vol, S 90 ml ------ Estimated CVP 20 mm ------ Vol index, 53.3 ml/m^2 ------ Hg S Right ventricle Pressure, S 67 mm <30 Hg Sa vel, lat 11 cm/s ------ ann, tiss DP  ------------------------------------------------------------ Prepared and Electronically Authenticated by  Donato Schultz 2013-08-26T13:19:53.803  ENDOSCOPY PROCEDURE REPORT  PATIENT: Tinleigh, Whitmire MR#: [id number]  BIRTHDATE: [birth year] , 89 yrs. old GENDER: Female  ENDOSCOPIST: Iva Boop, MD, [facility]  REFERRED BY:  PROCEDURE DATE: 07/15/2012  PROCEDURE: EGD w/ ablation  ASA CLASS: Class III  INDICATIONS: anemia secondary to chronic blood loss. AV  malformation.  MEDICATIONS: Fentanyl 50 mcg IV and Versed 4 mg IV  TOPICAL ANESTHETIC: none  DESCRIPTION OF PROCEDURE: After the risks benefits and alternatives  of the procedure were thoroughly explained, informed consent was  obtained. The Pentax  Gastroscope [id number] endoscope was introduced  through the mouth and advanced to the second portion of the  duodenum. Without limitations. The instrument was slowly withdrawn  as the mucosa was fully examined.  STOMACH: A large bleeding angiodysplastic lesion was found in the  gastric antrum. Gastric antral vascular ectasia oozing blood  after contact. It was treated and ablated with APC setting 40 and  1L flow.  The remainder of the upper endoscopy exam was otherwise normal.  Retroflexed views revealed no abnormalities. The scope was then  withdrawn from the patient and the procedure completed.  COMPLICATIONS: There were no complications.  ENDOSCOPIC IMPRESSION:  1. Bleeding angiodysplastic lesion in the gastric antrum - GAVE -  oozing - ablated and treated with APC  2. The remainder of the upper endoscopy exam was otherwise normal  RECOMMENDATIONS:  1. continue PPI single dose daily for 1 month  2. daily chronic iron therapy DO NOT STOP  CBC monthly  retreat if cannot hold Hgb steady with iron therapy  eSigned: Iva Boop, MD, [facility] 07/15/2012 5:20 PM  Revised: 07/15/2012 5:20 PM  Medications: I have reviewed the patient's current medications. Scheduled Meds:    . donepezil  10 mg Oral QHS  . ferrous sulfate  325 mg Oral BID WC  . iron dextran (INFED/DEXFERRUM) infusion  25 mg Intravenous Once   Followed by  . iron dextran (INFED/DEXFERRUM) infusion  1,000 mg Intravenous Once  . metoprolol tartrate  12.5 mg Oral BID  . pantoprazole  40 mg Oral Q0600  . sodium chloride  3 mL Intravenous Q12H  . DISCONTD: ferrous sulfate  325 mg Oral BID WC   Continuous Infusions:    . sodium chloride 10 mL/hr at 07/13/12 0047  . 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  # GI bleed: Presents with complaints of new onset shortness of breath. She is not a great historian with her dementia -the history was obtained by daughter. On exam, she was found to have heme positive stools and hemoglobin was 5.3 . Given her history of GAVE disease on endoscopy in 10/11, this is most likely recurrence of GAVE disease--confirmed by EGD on 82813.  - CBC q12h--hb stable.  - Transfused 4 units  of PRBC since admission;Cardio recommended Hb to be transfused to 10 if possible  - Morning Hb:9.5 - Hold BP meds.  - GI following: EGD yesterday, + bleeding angiodysplastic lesion in antrum, ablated.  Recommend PPI single dose daily for 1 month with chronic po iron with monthly cbc check - Parenteral Iron infusion as per GI recommendations and dose per pharmacy given yesterday - Protonix   # NSTEMI type 2: likely 2/2 demand ischemia from her anemia in the setting of GI bleed.  Daughter states she has been reporting on and off chest pain for last 1 month, 3 days ago cp relieved with nitro . She became SOB morning of admission when EMS was called. Her POC troponin was elevated to 0.81 and her D- dimer was elevated. Her EKG on admission showed ST depression in leads II, III, avF, V4, V5 and V6.  Not a candidate for cardiology procedures, treating anemia is treatment of choice.  - Continue to monitor for now.  - CE--Troponin down to 3.93 - Cardiology following: NSTEMI--ruled in for infarct, but conservative therapy in setting of significant anemia 2/2 GIB, hold all anticoagulation at this time, no ASA, heparin, or plavix, not a candidate for invasive cardiac evaluation.   - Continue low dose metoprolol but follow for bradycardia.  - f/u echo--EF 55-60%. Moderate concentric hypertrophy, grade 1 diastolic dysfunction, mild to  moderate AR, calcified mitral valve with mild to moderate regurge, PA peak pressure .  - PRN NTG ( Hold if SBP <97mm Hg)  - do not start statin at this time - hold ASA  #Tachycardia: likely 2/2 anemia causing high output state. Her D- dimer is elevated but this could be likely 2/2 to her volume overload/congestion with anemia. PE is low probability with her WELL's score of 2 (given her history of breast cancer and tachycardia) and modified Geneva score of 6. Her elevated D- dimer could be due to age, history of malignancy, and RA. She is not a candidate for anticoagulation at this time.  - Stable - Monitor vitals for now.   # Elevated Pro- BNP : ?could be 2/2 anemia causing some vascular congestion. She was found to have increased interstial markings on her CXR. Her last echo in 2009 showed EF of 70% with no diastolic dysfunction. Anemia causes activation of the baroreceptors (as it does in low-output CHF), which, in turn, activates the sympathetic, renin-angiotensin-aldosterone system and vasopressin systems, causing tachycardia and peripheral vasoconstriction and a decrease in renal blood flow and glomerular filtration rate. This leads to salt and water retention and an increase in extracellular and plasma volume.  - [id number]  - 2D echo--grade 1 diastolic dysfunction, EF 55-60%  - Cardiology following--continue low dose metoprolol  # Dementia: worse than baseline as reported by daughter likely in the setting of acute illness.  - continue to monitor mental status  - More calm today  #  HTN: BP stable.  -Hold BP meds in the setting of GI bleed.   # Hx of breast cancer; high-grade intraductal carcinoma, status post lumpectomy and radiation therapy in February 2000.  -stable  # Hx of RA: stable.  -does not meet criteria for bisphosphonates with CrCl:29 -calcim/vit D as outpatient  DVT: SCD's  Diet: Heart Healthy Dispo: d/c to home today  Daughter's phone  numbers: Cell:[phone] Home:[phone]   LOS: 4 days   Darden Palmer 07/16/2012, 10:12 AM

## 2012-07-24 ENCOUNTER — Telehealth: Payer: Self-pay | Admitting: *Deleted

## 2012-07-24 NOTE — Telephone Encounter (Signed)
Empty request 

## 2012-08-06 ENCOUNTER — Encounter: Payer: PRIVATE HEALTH INSURANCE | Admitting: Internal Medicine

## 2012-08-06 ENCOUNTER — Telehealth: Payer: Self-pay | Admitting: Internal Medicine

## 2012-08-06 DIAGNOSIS — T7401XA Adult neglect or abandonment, confirmed, initial encounter: Secondary | ICD-10-CM

## 2012-08-06 NOTE — Telephone Encounter (Signed)
Pt is a chronic no show pt and has no showed all three appts since her Aug 2013 D/C when she was admitted with a HgB of 5 2/2 UGI bleed 2/2 AVM. She had a NSTEMI 2/2 her her anemia at the admission. She has not had a HgB checked since D/C. She has dementia so is reliant on her daughter. She is at high risk M&M.  [place] Tim H and not a candidate for [facility] 2/2 insurance. He suggested I call Bertram Denver at [facility] - she is not a candidate for any of the care mgmt programs bc she isn't in their high risk categories. She suggested referring for [facility] RN. We had made referral at D/C for RN, dz mgmt, PT, aide, and social work through [facility] but daughter was never present to F/U.  Edson Snowball - would you please see if pt is receiving services thorough [facility]? If so, woul you pls use them to get the pt in for an appt or at least get a CBC at home.  Ifshe is not getting [facility], would you please make referral to [facility]PS? Or is that too drastic?  I tried to call the home but there is not tele number for me to call and talk to daughter about missed appts.

## 2012-08-12 ENCOUNTER — Telehealth: Payer: Self-pay | Admitting: Licensed Clinical Social Worker

## 2012-08-12 NOTE — Telephone Encounter (Signed)
Ms. Ketterman referred to CSW regarding lack of compliance for pt's medical care.  Ms. Vasey daughter is primary caregiver.  CSW placed call to pt's home number listed: [phone] spoke with pt's Lucila Maine, son of Ms. Wyrick.  Son states Ms. Wyrick handles medical care and follow up and will not be available until 4pm-5pm today at same phone number.  Grandson states, pt was receiving home health services shortly after d/c from hospital but services were d/c.   CSW placed call to [facility]. [facility] RN has not d/c services but is in need of new order.  Last home health order d/c on 08/08/12.  HH SW and PT has d/c. HH SW provided dau with information on resources, dau declined services as of 07/22/12.  CSW will request order for [facility] RN to restart services. CSW will make referral to [facility] for care management and discuss barriers to care.  CSW will request new frequency order for [facility] RN and fax order to [facility].  Will request HH RN to call this CSW to discuss pt's care prior to deciding need for referral to [facility]PS.

## 2012-08-13 ENCOUNTER — Telehealth: Payer: Self-pay | Admitting: Licensed Clinical Social Worker

## 2012-08-13 ENCOUNTER — Other Ambulatory Visit: Payer: Self-pay | Admitting: Licensed Clinical Social Worker

## 2012-08-13 DIAGNOSIS — D529 Folate deficiency anemia, unspecified: Secondary | ICD-10-CM

## 2012-08-13 DIAGNOSIS — F0390 Unspecified dementia without behavioral disturbance: Secondary | ICD-10-CM

## 2012-08-13 NOTE — Telephone Encounter (Signed)
CSW received call from nursing at [facility].  [facility] RN reaches dau by call [phone], dau Misty Stanley [facility]) Gibson.  [facility] RN stating pt's issue is less neglect but possibly related to behavioral issues r/t dementia.  Dau has expressed to [facility] RN that pt refuses to go to doctor appt's, to allow family to assist with bathing, and refuses to allow dau to set services up for pt.  [facility] requesting possible eval for HH ST to work with cognition.  [facility] can also complete lab draw, just needs order.  CSW will forward to Dr. Rogelia Boga. Another possibility for Ms. Fedor, if pt continue to decline care needs.  Possible to think about hospice referral at some point.

## 2012-08-13 NOTE — Telephone Encounter (Signed)
Referral to resume current services faxed to [facility]

## 2012-08-14 NOTE — Addendum Note (Signed)
Addended by: Blanch Media A on: 08/14/2012 10:58 AM   Modules accepted: Orders

## 2012-08-14 NOTE — Telephone Encounter (Signed)
Will ask HH to check CBC and BMP. Hopefully they will be able to convince family to bring pt in for appt.   I had tried to call daughter twice and up to 5:40 but daughter was neve in from work yet.

## 2012-08-18 NOTE — Telephone Encounter (Signed)
Called on the number noted below which went to voicemail. Left message to call back in Clinic.

## 2012-08-20 ENCOUNTER — Telehealth: Payer: Self-pay | Admitting: Licensed Clinical Social Worker

## 2012-08-20 NOTE — Telephone Encounter (Signed)
CSW faxed add'l order to add on HH ST, CBC & BMP, attempt to get pt into office for appt

## 2012-08-21 ENCOUNTER — Telehealth: Payer: Self-pay | Admitting: *Deleted

## 2012-08-21 NOTE — Telephone Encounter (Signed)
Speech therapist with [facility] wanted clinic aware family decline  assist at this time. Note sent to Dr Eben Burow.  Stanton Kidney Theodis Kinsel RN 08/21/12 1:10PM

## 2012-09-01 ENCOUNTER — Telehealth: Payer: Self-pay | Admitting: Licensed Clinical Social Worker

## 2012-09-01 DIAGNOSIS — D539 Nutritional anemia, unspecified: Secondary | ICD-10-CM

## 2012-09-01 NOTE — Telephone Encounter (Signed)
Thank you following up on her. I hope she is able to have a meaningful meeting with [facility].

## 2012-09-01 NOTE — Telephone Encounter (Signed)
Discussion with PCP and Dr. Rogelia Boga.  HH MSW to discuss options with family/pt regarding actively monitoring health issues vs more palliative care/home hospice.  Home Health RN will continue to follow pt until 10/30, family having difficulty getting patient into medical office for follow up care.  CSW will make referral to [facility] to assist with care and decision making with familly.

## 2012-09-01 NOTE — Telephone Encounter (Signed)
Thank you for F/U with this. I know you spent a lot of time on a single pt but she is vulnerable and can't be her own advocate.

## 2012-09-21 ENCOUNTER — Encounter: Payer: Self-pay | Admitting: Internal Medicine

## 2012-09-21 NOTE — Progress Notes (Signed)
Patient ID: Katherine [name], female   DOB: 1923-02-24, 78 y.o.   MRN: [id number]  This patient is a CHRONIC NO-SHOW PATIENT that has a history of HYPERTENSION.  Please make sure to address hypertension during her next clinic appointment, and intervene as appropriate.    Within the AVS, please incorporate the following smartphrase: .htntips   Pertinent Data: BP Readings from Last 3 Encounters:  07/16/12 140/86  07/16/12 140/86  07/16/12 140/86    BMI: Estimated Body mass index is 21.63 kg/(m^2) as calculated from the following:   Height as of 07/12/12: 5\' 6" (1.676 m).   Weight as of 07/12/12: 134 lb 0.6 oz(60.8 kg).  Smoking History: History  Smoking status  . Former Smoker  Smokeless tobacco  . Biochemist, clinical  . Types: Snuff    Comment: large bag/pack lasts 2 days.    Last Basic Metabolic Panel:    Component Value Date/Time   NA 141 07/15/2012 0927   K 3.9 07/15/2012 0927   CL 109 07/15/2012 0927   CO2 22 07/15/2012 0927   BUN 27* 07/15/2012 0927   CREATININE 1.25* 07/15/2012 0927   CREATININE 1.14 01/07/2011 1422   GLUCOSE 121* 07/15/2012 0927   CALCIUM 9.1 07/15/2012 0927   CALCIUM 9.7 09/25/2007 0000    Allergies: Allergies  Allergen Reactions  . Simvastatin     REACTION: nightmares

## 2012-10-09 ENCOUNTER — Other Ambulatory Visit (HOSPITAL_COMMUNITY): Payer: Self-pay | Admitting: Internal Medicine

## 2013-03-25 ENCOUNTER — Encounter: Payer: Self-pay | Admitting: Internal Medicine

## 2013-03-25 ENCOUNTER — Ambulatory Visit: Payer: PRIVATE HEALTH INSURANCE | Admitting: Internal Medicine

## 2013-03-29 ENCOUNTER — Telehealth: Payer: Self-pay | Admitting: *Deleted

## 2013-03-29 NOTE — Telephone Encounter (Signed)
Pt's caretaker/ daughter calls and states that she cannot get her mother out of the house most days due to her dimentia and missed an appt, she is wondering if there is someone that would come to the house (MD) to assess and treat pt, could you please call her? thank you

## 2013-03-30 ENCOUNTER — Telehealth: Payer: Self-pay | Admitting: Licensed Clinical Social Worker

## 2013-03-30 NOTE — Telephone Encounter (Signed)
Thank you.  Will follow up with family today.

## 2013-03-30 NOTE — Telephone Encounter (Signed)
CSW received referral from Triage RN.  CSW placed call to Katherine [name]'s dau/primary caregiver, Katherine [name].  Katherine [name] states as part of Katherine [name]'s dementia, Katherine [name] will not leave the house.  Katherine [name] is in need of health care maintenance as it has been over a year since Katherine [name] has had an [facility] visit. CSW placed call to [facility] for names of visiting physicians.  [facility].  CSW provided information to dau. CSW explained if Katherine [name]/family choose to receive services from [facility], [facility] will no longer be Katherine [name]'s primary care office.  Dau aware and voiced understanding.  Katherine [name] aware CSW is available to assist as needed.

## 2014-04-18 DIAGNOSIS — E46 Unspecified protein-calorie malnutrition: Secondary | ICD-10-CM

## 2014-04-18 HISTORY — DX: Unspecified protein-calorie malnutrition: E46

## 2014-04-25 ENCOUNTER — Inpatient Hospital Stay (HOSPITAL_COMMUNITY)
Admission: EM | Admit: 2014-04-25 | Discharge: 2014-05-01 | DRG: 469 | Disposition: A | Discharge status: Home Health | Payer: PRIVATE HEALTH INSURANCE | Attending: Family Medicine | Admitting: Family Medicine

## 2014-04-25 ENCOUNTER — Encounter (HOSPITAL_COMMUNITY): Payer: Self-pay | Admitting: Emergency Medicine

## 2014-04-25 ENCOUNTER — Emergency Department (HOSPITAL_COMMUNITY): Payer: PRIVATE HEALTH INSURANCE

## 2014-04-25 ENCOUNTER — Other Ambulatory Visit: Payer: Self-pay

## 2014-04-25 DIAGNOSIS — F329 Major depressive disorder, single episode, unspecified: Secondary | ICD-10-CM | POA: Diagnosis present

## 2014-04-25 DIAGNOSIS — I252 Old myocardial infarction: Secondary | ICD-10-CM | POA: Diagnosis not present

## 2014-04-25 DIAGNOSIS — G9340 Encephalopathy, unspecified: Secondary | ICD-10-CM | POA: Diagnosis present

## 2014-04-25 DIAGNOSIS — N63 Unspecified lump in unspecified breast: Secondary | ICD-10-CM

## 2014-04-25 DIAGNOSIS — S72001A Fracture of unspecified part of neck of right femur, initial encounter for closed fracture: Secondary | ICD-10-CM

## 2014-04-25 DIAGNOSIS — N189 Chronic kidney disease, unspecified: Secondary | ICD-10-CM | POA: Diagnosis present

## 2014-04-25 DIAGNOSIS — E860 Dehydration: Secondary | ICD-10-CM

## 2014-04-25 DIAGNOSIS — E46 Unspecified protein-calorie malnutrition: Secondary | ICD-10-CM | POA: Diagnosis present

## 2014-04-25 DIAGNOSIS — Z8249 Family history of ischemic heart disease and other diseases of the circulatory system: Secondary | ICD-10-CM | POA: Diagnosis not present

## 2014-04-25 DIAGNOSIS — R636 Underweight: Secondary | ICD-10-CM | POA: Diagnosis present

## 2014-04-25 DIAGNOSIS — Z833 Family history of diabetes mellitus: Secondary | ICD-10-CM | POA: Diagnosis not present

## 2014-04-25 DIAGNOSIS — E41 Nutritional marasmus: Secondary | ICD-10-CM | POA: Diagnosis present

## 2014-04-25 DIAGNOSIS — F0390 Unspecified dementia without behavioral disturbance: Secondary | ICD-10-CM | POA: Diagnosis present

## 2014-04-25 DIAGNOSIS — D500 Iron deficiency anemia secondary to blood loss (chronic): Secondary | ICD-10-CM | POA: Diagnosis present

## 2014-04-25 DIAGNOSIS — E43 Unspecified severe protein-calorie malnutrition: Secondary | ICD-10-CM

## 2014-04-25 DIAGNOSIS — K5900 Constipation, unspecified: Secondary | ICD-10-CM | POA: Diagnosis not present

## 2014-04-25 DIAGNOSIS — F3289 Other specified depressive episodes: Secondary | ICD-10-CM | POA: Diagnosis present

## 2014-04-25 DIAGNOSIS — K31819 Angiodysplasia of stomach and duodenum without bleeding: Secondary | ICD-10-CM | POA: Diagnosis present

## 2014-04-25 DIAGNOSIS — S72009A Fracture of unspecified part of neck of unspecified femur, initial encounter for closed fracture: Principal | ICD-10-CM | POA: Diagnosis present

## 2014-04-25 DIAGNOSIS — W19XXXA Unspecified fall, initial encounter: Secondary | ICD-10-CM | POA: Diagnosis present

## 2014-04-25 DIAGNOSIS — Z681 Body mass index (BMI) 19 or less, adult: Secondary | ICD-10-CM | POA: Diagnosis not present

## 2014-04-25 DIAGNOSIS — I129 Hypertensive chronic kidney disease with stage 1 through stage 4 chronic kidney disease, or unspecified chronic kidney disease: Secondary | ICD-10-CM | POA: Diagnosis present

## 2014-04-25 DIAGNOSIS — E870 Hyperosmolality and hypernatremia: Secondary | ICD-10-CM

## 2014-04-25 DIAGNOSIS — D62 Acute posthemorrhagic anemia: Secondary | ICD-10-CM | POA: Diagnosis not present

## 2014-04-25 DIAGNOSIS — Z87891 Personal history of nicotine dependence: Secondary | ICD-10-CM

## 2014-04-25 DIAGNOSIS — R634 Abnormal weight loss: Secondary | ICD-10-CM | POA: Diagnosis present

## 2014-04-25 DIAGNOSIS — M810 Age-related osteoporosis without current pathological fracture: Secondary | ICD-10-CM | POA: Diagnosis present

## 2014-04-25 DIAGNOSIS — D696 Thrombocytopenia, unspecified: Secondary | ICD-10-CM | POA: Diagnosis not present

## 2014-04-25 DIAGNOSIS — Z8673 Personal history of transient ischemic attack (TIA), and cerebral infarction without residual deficits: Secondary | ICD-10-CM

## 2014-04-25 HISTORY — DX: Headache: R51

## 2014-04-25 HISTORY — DX: Unspecified protein-calorie malnutrition: E46

## 2014-04-25 HISTORY — DX: Malignant neoplasm of unspecified site of right female breast: C50.911

## 2014-04-25 HISTORY — DX: Fracture of unspecified part of neck of unspecified femur, initial encounter for closed fracture: S72.009A

## 2014-04-25 HISTORY — DX: Rheumatoid arthritis, unspecified: M06.9

## 2014-04-25 LAB — CBC WITH DIFFERENTIAL/PLATELET
BASOS ABS: 0 10*3/uL (ref 0.0–0.1)
BASOS PCT: 0 % (ref 0–1)
EOS ABS: 0 10*3/uL (ref 0.0–0.7)
Eosinophils Relative: 0 % (ref 0–5)
HCT: 27.7 % — ABNORMAL LOW (ref 36.0–46.0)
HEMOGLOBIN: 8.1 g/dL — AB (ref 12.0–15.0)
LYMPHS PCT: 5 % — AB (ref 12–46)
Lymphs Abs: 0.5 10*3/uL — ABNORMAL LOW (ref 0.7–4.0)
MCH: 21.4 pg — AB (ref 26.0–34.0)
MCHC: 29.2 g/dL — AB (ref 30.0–36.0)
MCV: 73.3 fL — ABNORMAL LOW (ref 78.0–100.0)
MONO ABS: 0.9 10*3/uL (ref 0.1–1.0)
Monocytes Relative: 9 % (ref 3–12)
Neutro Abs: 8.9 10*3/uL — ABNORMAL HIGH (ref 1.7–7.7)
Neutrophils Relative %: 86 % — ABNORMAL HIGH (ref 43–77)
Platelets: 208 10*3/uL (ref 150–400)
RBC: 3.78 MIL/uL — ABNORMAL LOW (ref 3.87–5.11)
RDW: 17.7 % — AB (ref 11.5–15.5)
WBC: 10.3 10*3/uL (ref 4.0–10.5)

## 2014-04-25 LAB — COMPREHENSIVE METABOLIC PANEL
ALT: 18 U/L (ref 0–35)
AST: 36 U/L (ref 0–37)
Albumin: 3 g/dL — ABNORMAL LOW (ref 3.5–5.2)
Alkaline Phosphatase: 87 U/L (ref 39–117)
BUN: 45 mg/dL — ABNORMAL HIGH (ref 6–23)
CALCIUM: 10.1 mg/dL (ref 8.4–10.5)
CO2: 20 meq/L (ref 19–32)
CREATININE: 1.22 mg/dL — AB (ref 0.50–1.10)
Chloride: 116 mEq/L — ABNORMAL HIGH (ref 96–112)
GFR, EST AFRICAN AMERICAN: 44 mL/min — AB (ref >90)
GFR, EST NON AFRICAN AMERICAN: 38 mL/min — AB (ref >90)
GLUCOSE: 130 mg/dL — AB (ref 70–99)
Potassium: 3.5 mEq/L — ABNORMAL LOW (ref 3.7–5.3)
Sodium: 153 mEq/L — ABNORMAL HIGH (ref 137–147)
Total Bilirubin: 1.1 mg/dL (ref 0.3–1.2)
Total Protein: 8.1 g/dL (ref 6.0–8.3)

## 2014-04-25 LAB — URINALYSIS, ROUTINE W REFLEX MICROSCOPIC
Glucose, UA: NEGATIVE mg/dL
Hgb urine dipstick: NEGATIVE
Ketones, ur: NEGATIVE mg/dL
LEUKOCYTES UA: NEGATIVE
NITRITE: NEGATIVE
PH: 5 (ref 5.0–8.0)
Protein, ur: NEGATIVE mg/dL
SPECIFIC GRAVITY, URINE: 1.025 (ref 1.005–1.030)
Urobilinogen, UA: 1 mg/dL (ref 0.0–1.0)

## 2014-04-25 LAB — I-STAT CG4 LACTIC ACID, ED: LACTIC ACID, VENOUS: 1.19 mmol/L (ref 0.5–2.2)

## 2014-04-25 LAB — PROTIME-INR
INR: 1.17 (ref 0.00–1.49)
Prothrombin Time: 14.7 seconds (ref 11.6–15.2)

## 2014-04-25 LAB — TROPONIN I: Troponin I: <0.3 ng/mL (ref <0.30)

## 2014-04-25 LAB — PRO B NATRIURETIC PEPTIDE: Pro B Natriuretic peptide (BNP): 1724 pg/mL — ABNORMAL HIGH (ref 0–450)

## 2014-04-25 LAB — LIPASE, BLOOD: LIPASE: 42 U/L (ref 11–59)

## 2014-04-25 LAB — CBG MONITORING, ED: GLUCOSE-CAPILLARY: 126 mg/dL — AB (ref 70–99)

## 2014-04-25 MED ORDER — RISPERIDONE 0.5 MG PO TABS
0.5000 mg | ORAL_TABLET | Freq: Two times a day (BID) | ORAL | Status: DC
  Administered 2014-04-25 – 2014-05-01 (×11): 0.5 mg via ORAL
  Filled 2014-04-25 (×13): qty 1

## 2014-04-25 MED ORDER — HYDROCODONE-ACETAMINOPHEN 5-325 MG PO TABS
1.0000 | ORAL_TABLET | Freq: Four times a day (QID) | ORAL | Status: DC | PRN
  Administered 2014-04-27 – 2014-04-30 (×2): 1 via ORAL
  Filled 2014-04-25 (×2): qty 1

## 2014-04-25 MED ORDER — SODIUM CHLORIDE 0.9 % IV SOLN
INTRAVENOUS | Status: DC
Start: 2014-04-25 — End: 2014-04-28
  Administered 2014-04-25: via INTRAVENOUS

## 2014-04-25 MED ORDER — MORPHINE SULFATE 2 MG/ML IJ SOLN
0.5000 mg | INTRAMUSCULAR | Status: DC | PRN
  Administered 2014-04-26: 0.5 mg via INTRAVENOUS
  Filled 2014-04-25: qty 1

## 2014-04-25 MED ORDER — SODIUM CHLORIDE 0.9 % IV BOLUS (SEPSIS)
250.0000 mL | Freq: Once | INTRAVENOUS | Status: AC
  Administered 2014-04-25: 250 mL via INTRAVENOUS

## 2014-04-25 MED ORDER — SODIUM CHLORIDE 0.9 % IV SOLN
INTRAVENOUS | Status: DC
Start: 2014-04-25 — End: 2014-04-27

## 2014-04-25 MED ORDER — DONEPEZIL HCL 5 MG PO TABS
5.0000 mg | ORAL_TABLET | Freq: Every day | ORAL | Status: DC
  Administered 2014-04-25 – 2014-04-30 (×6): 5 mg via ORAL
  Filled 2014-04-25 (×7): qty 1

## 2014-04-25 MED ORDER — ASPIRIN EC 325 MG PO TBEC
325.0000 mg | DELAYED_RELEASE_TABLET | Freq: Every day | ORAL | Status: DC
  Administered 2014-04-25 – 2014-04-27 (×2): 325 mg via ORAL
  Filled 2014-04-25 (×3): qty 1

## 2014-04-25 MED ORDER — QUETIAPINE FUMARATE 50 MG PO TABS
50.0000 mg | ORAL_TABLET | Freq: Every day | ORAL | Status: DC
  Administered 2014-04-25 – 2014-04-30 (×6): 50 mg via ORAL
  Filled 2014-04-25 (×7): qty 1

## 2014-04-25 NOTE — Consult Note (Signed)
Katherine Nakayama, MD           Loni Dolly, PA-C  [name] [place], [place], [place]  [address]   ORTHOPAEDIC CONSULTATION  [name]            MRN:  [id number] DOB/SEX:  1923/06/05/female    REQUESTING PHYSICIAN:    CHIEF COMPLAINT:  Painful right hip  HISTORY: Katherine [name] a 80 y.o. female with AMS who presents with a right femoral neck fracture after a fall. Patient is alone in her room at this time. She is awake but has sever dementia. She does not respond appropriately to any questions. Records and history are taken from chart review as she is not able to communicate.    PAST MEDICAL HISTORY: Patient Active Problem List   Diagnosis Date Noted  . Fracture of femoral neck, right 04/25/2014  . Malnutrition 04/25/2014  . Hypernatremia 04/25/2014  . Iron deficiency anemia secondary to blood loss (chronic) 07/13/2012  . Gastric antral vascular ectasia (watermelon stomach) 07/13/2012  . NSTEMI (non-ST elevated myocardial infarction) 07/12/2012  . Dementia 02/05/2012  . Plantar wart of right foot 02/05/2012  . Benign skin lesion of forehead 02/05/2012  . AV BLOCK, 1ST DEGREE 06/22/2009  . HYPERLIPIDEMIA 02/07/2009  . VITAMIN B12 DEFICIENCY 10/06/2008  . TIA 12/03/2007  . Pain in joint, shoulder region 12/03/2007  . RENAL INSUFFICIENCY 08/25/2007  . KNEE PAIN, RIGHT, CHRONIC 08/05/2007  . OSTEOPOROSIS 05/08/2007  . SYMPTOM, MEMORY LOSS 04/08/2007  . BREAST MASS, LEFT 03/25/2007  . ANEMIA, FOLATE-DEFICIENCY 11/05/2006  . DISORDER, DYSTHYMIC 09/26/2006  . HYPERTENSION 09/26/2006  . RHEUMATOID ARTHRITIS 09/26/2006   Past Medical History  Diagnosis Date  . Hypertension   . TIA (transient ischemic attack)     Declines aggrenox  . Renal insufficiency     Baseline 1.3  . Depression     Improved with remeron  . Rheumatoid arteritis     Previously on MTX x14yr, failed appropiate f/u  . Breast cancer     Hx of DCIS 12/99,   completed XRT 12/20/1998- 02/02/1999 tamoxifen 20 mg once daily March 2002 to June 2006,  all mammograms since then have been negative last April 2009.  Marland Kitchen Anemia, macrocytic      most likely secondary to methotrexate use  . Osteoporosis      T score- 3.0 right hip, -2.8 left hip and -2.1 lumbar spine (April 22, 2007). Not on bisphosphonate because of renal insufficiency.  . 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 Dementia   . Diverticulosis of colon 07/21/2009    severe widely distributed tics on colonoscopy   Past Surgical History  Procedure Laterality Date  . Appendectomy    . Cholecystectomy    . Lung biopsies.  2011    VATS bx:  benign granuloma.   . Esophagogastroduodenoscopy  07/15/2012    Procedure: ESOPHAGOGASTRODUODENOSCOPY (EGD);  Surgeon: Gatha Mayer, MD;  Location: [facility] ENDOSCOPY;  Service: Endoscopy;  Laterality: N/A;     MEDICATIONS:  Current facility-administered medications:0.9 %  sodium chloride infusion, , Intravenous, Continuous, Fredia Sorrow, MD;  0.9 %  sodium chloride infusion, , Intravenous, STAT, Fredia Sorrow, MD;  aspirin EC tablet 325 mg, 325 mg, Oral, Daily, Jared M Gardner, DO;  donepezil (ARICEPT) tablet 5 mg, 5 mg, Oral, QHS, Jared M Gardner, DO HYDROcodone-acetaminophen (NORCO/VICODIN) 5-325 MG per tablet 1-2 tablet, 1-2 tablet, Oral, Q6H PRN, Etta Quill, DO;  morphine 2 MG/ML injection 0.5 mg, 0.5 mg, Intravenous, Q2H PRN, Etta Quill, DO;  QUEtiapine (SEROQUEL) tablet 50 mg, 50 mg, Oral, QHS, Jared M Gardner, DO;  risperiDONE (RISPERDAL) tablet 0.5 mg, 0.5 mg, Oral, BID, Etta Quill, DO  ALLERGIES:   Allergies  Allergen Reactions  . Simvastatin     REACTION: nightmares    REVIEW OF SYSTEMS: REVIEWED IN DETAIL IN CHART  FAMILY HISTORY:   Family History  Problem Relation Age of Onset  . Cancer Sister     Died  . Diabetes Brother   . Coronary artery disease Mother     died age 85  . Diabetes Daughter     died age 68     SOCIAL HISTORY:   History  Substance Use Topics  . Smoking status: Former Research scientist (life sciences)  . Smokeless tobacco: Current User    Types: Snuff     Comment: large bag/pack lasts 2 days.  . Alcohol Use: No     EXAMINATION: Vital signs in last 24 hours: Temp:  [98 F (36.7 C)-99.4 F (37.4 C)] 98 F (36.7 C) (06/08 2039) Pulse Rate:  [90-108] 105 (06/08 2039) Resp:  [16-24] 20 (06/08 2039) BP: (95-140)/(47-84) 139/70 mmHg (06/08 2039) SpO2:  [95 %-100 %] 99 % (06/08 2039) Weight:  [44.453 kg (98 lb)] 44.453 kg (98 lb) (06/08 1619)  General appearance: no distress and with Altared mental status. Head: Normocephalic, without obvious abnormality, atraumatic Lungs: unlabored respiration Extremities: extremities normal, atraumatic, no cyanosis or edema and no edema, redness or tenderness in the calves or thighs Pulses: 2+ and symmetric Skin: Skin color, texture, turgor normal. No rashes or lesions  Musculoskeletal Exam: Patient laying in bed on left hip with both knees flexed up.2 + DP pulses bilaterally and patient wiggles toes on command. She does not grimace with palpation to the right hip. Exam limited due to dimentia     DIAGNOSTIC STUDIES: Recent laboratory studies:  Recent Labs  04/25/14 1640  WBC 10.3  HGB 8.1*  HCT 27.7*  PLT 208    Recent Labs  04/25/14 1640  NA 153*  K 3.5*  CL 116*  CO2 20  BUN 45*  CREATININE 1.22*  GLUCOSE 130*  CALCIUM 10.1   Lab Results  Component Value Date   INR 1.17 04/25/2014   INR 1.02 07/12/2012   INR 1.00 09/09/2010     Recent Radiographic Studies :  Dg Chest 1 View  04/25/2014   CLINICAL DATA:  Altered mental status.  Fall.  Dementia.  EXAM: CHEST - 1 VIEW  COMPARISON:  Multiple prior chest radiographs dating back to 09/06/2010.  FINDINGS: Patient rotated to the left. Stable cardiomegaly. Atherosclerotic calcification of the thoracic aortic arch. Chronic mild elevation of the left hemidiaphragm with chronic blunting of the left  costophrenic angle. Chain suture noted with in the left lung. Right lung is hyperinflated and clear. Cholecystectomy clips noted. Diffuse osteopenia.  IMPRESSION: Postsurgical changes of the left lung with chronic volume loss, elevation left hemidiaphragm, and chronic blunting of the left costophrenic angle. No acute findings in the chest.  Stable cardiomegaly.   Electronically Signed   By: Curlene Dolphin M.D.   On: 04/25/2014 17:39   Dg Hip Bilateral W/pelvis  04/25/2014   CLINICAL DATA:  Fall.  Hip pain.  Dementia.  EXAM: BILATERAL HIP WITH PELVIS - 4+ VIEW  COMPARISON:  Katherine [name].  FINDINGS: The bones are diffusely osteopenic. There is an acute right femoral neck fracture with varus angulation.  Slight proximal displacement of the distal fracture fragment. The right hip is located.  The left hip is located and proximal left femur appears intact.  Pelvic ring appears intact.  No fracture or diastasis is identified.  IMPRESSION: Acute right femoral neck fracture.   Electronically Signed   By: Curlene Dolphin M.D.   On: 04/25/2014 17:43   Ct Head Wo Contrast  04/25/2014   CLINICAL DATA:  80 year old female with altered mental status fall and hip pain. Initial encounter.  EXAM: CT HEAD WITHOUT CONTRAST  CT CERVICAL SPINE WITHOUT CONTRAST  TECHNIQUE: Multidetector CT imaging of the head and cervical spine was performed following the standard protocol without intravenous contrast. Multiplanar CT image reconstructions of the cervical spine were also generated.  COMPARISON:  Head CT 11/22/2007.  Brain MRI 11/23/2007.  FINDINGS: CT HEAD FINDINGS  Study is intermittently degraded by motion artifact despite repeated imaging attempts.  Focal left forehead scalp hematoma measures up to 8 mm in thickness. Underlying left frontal bone is intact. No other scalp hematoma identified. Grossly stable and clear paranasal sinuses and mastoids. Grossly negative orbits soft tissues.  Calcified atherosclerosis at the skull base. Cerebral  volume is not significantly changed since 2009. No ventriculomegaly. No midline shift, mass effect, or evidence of intracranial mass lesion. No acute intracranial hemorrhage identified. No evidence of cortically based acute infarction identified.  CT CERVICAL SPINE FINDINGS  Study is moderately to severely degraded by motion artifact despite repeated imaging attempts.  Reformatted images are not very useful on this study related to the motion. Axial images suggest no definite acute cervical spine fracture. Grossly intact visualized upper thoracic levels. Negative lung apices except for scarring and emphysema. Grossly negative paraspinal soft tissues.  IMPRESSION: 1. No definite acute intracranial abnormality and stable noncontrast CT appearance of the brain since 2009 allowing for motion artifact. 2. Motion degraded cervical spine CT, with no definite acute cervical spine fracture. If neck pain persists, a repeat cervical spine CT is recommended when the patient can better cooperate.   Electronically Signed   By: Lars Pinks M.D.   On: 04/25/2014 17:24   Ct Cervical Spine Wo Contrast  04/25/2014   CLINICAL DATA:  80 year old female with altered mental status fall and hip pain. Initial encounter.  EXAM: CT HEAD WITHOUT CONTRAST  CT CERVICAL SPINE WITHOUT CONTRAST  TECHNIQUE: Multidetector CT imaging of the head and cervical spine was performed following the standard protocol without intravenous contrast. Multiplanar CT image reconstructions of the cervical spine were also generated.  COMPARISON:  Head CT 11/22/2007.  Brain MRI 11/23/2007.  FINDINGS: CT HEAD FINDINGS  Study is intermittently degraded by motion artifact despite repeated imaging attempts.  Focal left forehead scalp hematoma measures up to 8 mm in thickness. Underlying left frontal bone is intact. No other scalp hematoma identified. Grossly stable and clear paranasal sinuses and mastoids. Grossly negative orbits soft tissues.  Calcified atherosclerosis  at the skull base. Cerebral volume is not significantly changed since 2009. No ventriculomegaly. No midline shift, mass effect, or evidence of intracranial mass lesion. No acute intracranial hemorrhage identified. No evidence of cortically based acute infarction identified.  CT CERVICAL SPINE FINDINGS  Study is moderately to severely degraded by motion artifact despite repeated imaging attempts.  Reformatted images are not very useful on this study related to the motion. Axial images suggest no definite acute cervical spine fracture. Grossly intact visualized upper thoracic levels. Negative lung apices except for scarring and emphysema. Grossly negative paraspinal soft tissues.  IMPRESSION: 1. No definite acute intracranial abnormality and stable noncontrast CT appearance of the brain since 2009 allowing for motion artifact. 2. Motion degraded cervical spine CT, with no definite acute cervical spine fracture. If neck pain persists, a repeat cervical spine CT is recommended when the patient can better cooperate.   Electronically Signed   By: Lars Pinks M.D.   On: 04/25/2014 17:24    ASSESSMENT: Right femoral neck fracture    PLAN: When stable from a medical standpoint patient will have a right hip hemi arthroplasty. Hopefully we will be able to preform this tomorrow. We will discuss this with the patients daughter tomorrow morning.   Larwance Sachs Renesha Lizama 04/25/2014, 11:04 PM

## 2014-04-25 NOTE — ED Notes (Signed)
CG-4 reported to Dr. Rogene Houston

## 2014-04-25 NOTE — ED Notes (Signed)
Attempted report x1. 

## 2014-04-25 NOTE — ED Notes (Signed)
To ED via [facility] from home with daughter stating that pt fell yesterday-- unwitnessed, unknown LOC-- but has been increasingly more confused today-- hx of dementia. Lives with daughter, strong urine odor. Pt verbal, normally walks with a cane. Has not walked since she fell, continues to favor left side with c/o pain in left hip.

## 2014-04-25 NOTE — ED Notes (Signed)
Admitting to bedside

## 2014-04-25 NOTE — H&P (Addendum)
[facility] History and Physical  Katherine [name]:[id number] DOB: Apr 16, 1923 DOA: 04/25/2014  Referring physician: EDP PCP: Default, Provider, MD   Chief Complaint: Fall, AMS   HPI: Katherine [name] is a 80 y.o. female who presents to the ED with AMS.  At baseline she has severe dementia, does not leave the home any more.  Is cared for by daughter who lives with the patient.  She walks with a cane at baseline.  After a fall yesterday from a standing position, she has apparently been getting more confused today, and been non-ambulatory since the fall.  Daughter brought the patient in to the ED for evaluation.  In the ED she was found to have a R femoral neck fracture.  Review of Systems: Unable to perform due to dementia.  Past Medical History  Diagnosis Date  . Hypertension   . TIA (transient ischemic attack)     Declines aggrenox  . Renal insufficiency     Baseline 1.3  . Depression     Improved with remeron  . Rheumatoid arteritis     Previously on MTX x75yr, failed appropiate f/u  . 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  Marland Kitchen Anemia, macrocytic      most likely secondary to methotrexate use  . Osteoporosis      T score- 3.0 right hip, -2.8 left hip and -2.1 lumbar spine (April 22, 2007). Not on bisphosphonate because of renal insufficiency.  . 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 Dementia   . Diverticulosis of colon 07/21/2009    severe widely distributed tics on colonoscopy   Past Surgical History  Procedure Laterality Date  . Appendectomy    . Cholecystectomy    . Lung biopsies.  2011    VATS bx:  benign granuloma.   . Esophagogastroduodenoscopy  07/15/2012    Procedure: ESOPHAGOGASTRODUODENOSCOPY (EGD);  Surgeon: Gatha Mayer, MD;  Location: [facility] ENDOSCOPY;  Service: Endoscopy;  Laterality: N/A;    Social History:  reports that she has quit smoking. Her smokeless tobacco use includes Snuff. She reports that she does not drink alcohol. Her drug history is not on file.  Allergies  Allergen Reactions  . Simvastatin     REACTION: nightmares    Family History  Problem Relation Age of Onset  . Cancer Sister     Died  . Diabetes Brother   . Coronary artery disease Mother     died age 42  . Diabetes Daughter     died age 32     Prior to Admission medications   Medication Sig Start Date End Date Taking? Authorizing Provider  donepezil (ARICEPT) 5 MG tablet Take 5 mg by mouth at bedtime.   Yes Historical Provider, MD  hydrochlorothiazide (HYDRODIURIL) 12.5 MG tablet Take 12.5 mg by mouth daily. 03/05/14  Yes Historical Provider, MD  QUEtiapine (SEROQUEL) 50 MG tablet Take 50 mg by mouth at bedtime. 03/05/14  Yes Historical Provider, MD  risperiDONE (RISPERDAL) 0.5 MG tablet Take 0.5 mg by mouth 2 (two) times daily. 03/05/14  Yes Historical Provider, MD   Physical Exam: Filed Vitals:   04/25/14 1830  BP: 123/70  Pulse: 102  Temp:   Resp: 24    BP 123/70  Pulse 102  Temp(Src) 99.4 F (37.4 C) (Rectal)  Resp 24  Wt 44.453 kg (98 lb)  SpO2 100%  General Appearance:    Alert, not oriented, laying with left side down, eating a sandwich, no distress, appears stated age, cachectic  Head:    Normocephalic, atraumatic  Eyes:    PERRL, EOMI, sclera non-icteric        Nose:   Nares without drainage or epistaxis. Mucosa, turbinates normal  Throat:   Moist mucous membranes. Oropharynx without erythema or exudate.  Neck:   Supple. No carotid bruits.  No thyromegaly.  No lymphadenopathy.   Back:     No CVA tenderness, no spinal tenderness  Lungs:     Clear to auscultation bilaterally, without wheezes, rhonchi or rales  Chest wall:    No tenderness to palpitation  Heart:    Regular rate and rhythm without murmurs, gallops, rubs  Abdomen:     Soft, non-tender, nondistended, normal  bowel sounds, no organomegaly  Genitalia:    deferred  Rectal:    deferred  Extremities:   No clubbing, cyanosis or edema.  Pulses:   2+ and symmetric all extremities  Skin:   Skin color, texture, turgor normal, no rashes or lesions  Lymph nodes:   Cervical, supraclavicular, and axillary nodes normal  Neurologic:   CNII-XII intact. Normal strength, sensation and reflexes      Throughout, awake but confused, non-verbal, appears to have R facial droop but per daughter this is because she is lying on her L side and always looks like this when lying on that side (unchanged from baseline).    Labs on Admission:  Basic Metabolic Panel:  Recent Labs Lab 04/25/14 1640  NA 153*  K 3.5*  CL 116*  CO2 20  GLUCOSE 130*  BUN 45*  CREATININE 1.22*  CALCIUM 10.1   Liver Function Tests:  Recent Labs Lab 04/25/14 1640  AST 36  ALT 18  ALKPHOS 87  BILITOT 1.1  PROT 8.1  ALBUMIN 3.0*    Recent Labs Lab 04/25/14 1640  LIPASE 42   No results found for this basename: AMMONIA,  in the last 168 hours CBC:  Recent Labs Lab 04/25/14 1640  WBC 10.3  NEUTROABS 8.9*  HGB 8.1*  HCT 27.7*  MCV 73.3*  PLT 208   Cardiac Enzymes:  Recent Labs Lab 04/25/14 1640  TROPONINI <0.30    BNP (last 3 results)  Recent Labs  04/25/14 1640  PROBNP 1724.0*   CBG:  Recent Labs Lab 04/25/14 1737  GLUCAP 126*    Radiological Exams on Admission: Dg Chest 1 View  04/25/2014   CLINICAL DATA:  Altered mental status.  Fall.  Dementia.  EXAM: CHEST - 1 VIEW  COMPARISON:  Multiple prior chest radiographs dating back to 09/06/2010.  FINDINGS: Patient rotated to the left. Stable cardiomegaly. Atherosclerotic calcification of the thoracic aortic arch. Chronic mild elevation of the left hemidiaphragm with chronic blunting of the left costophrenic angle. Chain suture noted with in the left lung. Right lung is hyperinflated and clear. Cholecystectomy clips noted. Diffuse osteopenia.  IMPRESSION:  Postsurgical changes of the left lung with chronic volume loss, elevation left hemidiaphragm, and chronic blunting of the left costophrenic angle. No acute findings in the chest.  Stable cardiomegaly.   Electronically Signed   By: Curlene Dolphin M.D.   On: 04/25/2014 17:39   Dg Hip Bilateral W/pelvis  04/25/2014   CLINICAL DATA:  Fall.  Hip pain.  Dementia.  EXAM: BILATERAL HIP WITH PELVIS - 4+ VIEW  COMPARISON:  None.  FINDINGS: The bones are diffusely osteopenic. There is an acute right femoral neck fracture with varus angulation. Slight proximal displacement of the distal fracture fragment. The right hip is located.  The left hip is located and proximal left femur appears intact.  Pelvic ring appears intact.  No fracture or diastasis is identified.  IMPRESSION: Acute right femoral neck fracture.   Electronically Signed   By: Curlene Dolphin M.D.   On: 04/25/2014 17:43   Ct Head Wo Contrast  04/25/2014   CLINICAL DATA:  80 year old female with altered mental status fall and hip pain. Initial encounter.  EXAM: CT HEAD WITHOUT CONTRAST  CT CERVICAL SPINE WITHOUT CONTRAST  TECHNIQUE: Multidetector CT imaging of the head and cervical spine was performed following the standard protocol without intravenous contrast. Multiplanar CT image reconstructions of the cervical spine were also generated.  COMPARISON:  Head CT 11/22/2007.  Brain MRI 11/23/2007.  FINDINGS: CT HEAD FINDINGS  Study is intermittently degraded by motion artifact despite repeated imaging attempts.  Focal left forehead scalp hematoma measures up to 8 mm in thickness. Underlying left frontal bone is intact. No other scalp hematoma identified. Grossly stable and clear paranasal sinuses and mastoids. Grossly negative orbits soft tissues.  Calcified atherosclerosis at the skull base. Cerebral volume is not significantly changed since 2009. No ventriculomegaly. No midline shift, mass effect, or evidence of intracranial mass lesion. No acute intracranial  hemorrhage identified. No evidence of cortically based acute infarction identified.  CT CERVICAL SPINE FINDINGS  Study is moderately to severely degraded by motion artifact despite repeated imaging attempts.  Reformatted images are not very useful on this study related to the motion. Axial images suggest no definite acute cervical spine fracture. Grossly intact visualized upper thoracic levels. Negative lung apices except for scarring and emphysema. Grossly negative paraspinal soft tissues.  IMPRESSION: 1. No definite acute intracranial abnormality and stable noncontrast CT appearance of the brain since 2009 allowing for motion artifact. 2. Motion degraded cervical spine CT, with no definite acute cervical spine fracture. If neck pain persists, a repeat cervical spine CT is recommended when the patient can better cooperate.   Electronically Signed   By: Lars Pinks M.D.   On: 04/25/2014 17:24   Ct Cervical Spine Wo Contrast  04/25/2014   CLINICAL DATA:  80 year old female with altered mental status fall and hip pain. Initial encounter.  EXAM: CT HEAD WITHOUT CONTRAST  CT CERVICAL SPINE WITHOUT CONTRAST  TECHNIQUE: Multidetector CT imaging of the head and cervical spine was performed following the standard protocol without intravenous contrast. Multiplanar CT image reconstructions of the cervical spine were also generated.  COMPARISON:  Head CT 11/22/2007.  Brain MRI 11/23/2007.  FINDINGS: CT HEAD FINDINGS  Study is intermittently degraded by motion artifact despite repeated imaging attempts.  Focal left forehead scalp hematoma measures up to 8 mm in thickness. Underlying left frontal bone is intact. No other scalp hematoma identified. Grossly stable and clear paranasal sinuses and mastoids. Grossly negative orbits soft tissues.  Calcified atherosclerosis at the skull base. Cerebral volume is not significantly changed since 2009. No ventriculomegaly. No midline shift, mass effect, or evidence of intracranial mass  lesion. No acute intracranial hemorrhage identified. No evidence of cortically based acute infarction identified.  CT CERVICAL SPINE FINDINGS  Study is moderately to severely degraded by motion artifact despite repeated imaging attempts.  Reformatted images are not very useful on this study related to the motion. Axial images suggest no definite acute cervical spine fracture. Grossly  intact visualized upper thoracic levels. Negative lung apices except for scarring and emphysema. Grossly negative paraspinal soft tissues.  IMPRESSION: 1. No definite acute intracranial abnormality and stable noncontrast CT appearance of the brain since 2009 allowing for motion artifact. 2. Motion degraded cervical spine CT, with no definite acute cervical spine fracture. If neck pain persists, a repeat cervical spine CT is recommended when the patient can better cooperate.   Electronically Signed   By: Lars Pinks M.D.   On: 04/25/2014 17:24    EKG: Independently reviewed.  Assessment/Plan Principal Problem:   Fracture of femoral neck, right Active Problems:   Iron deficiency anemia secondary to blood loss (chronic)   Gastric antral vascular ectasia (watermelon stomach)   Malnutrition   Hypernatremia   1. R femoral neck fracture - on hip fracture protocol, ASA for DVT ppx, NPO after midnight, ortho to eval patient and discuss options with daughter. 2. Malnutrition - patient with normal PO intake but unintentional weight loss over the past 1 year.  Very worrisome history given the history of breast mass in 2008 with radiation and tamoxifen treatment.  On the other hand, given the hip fracture and concern for poor recovery from this (due to dementia, etc.), I'm not so sure this is going to matter much, but probably should be worked up since daughter under impression that BRCA is in remission. 3. Anemia - hemoccult ordered, HGB 8.1, back in 2013 baseline appeared to be about 9.  Unclear if ongoing bleeding occuring due to  watermelon stomach.  Repeat CBC in AM. 4. Hypernatremia - likely due to dehydration, hydrating with NS, repeat BMP in AM.    Code Status: Full Code  Family Communication: Daughter is at bedside Disposition Plan: Admit to inpatient   Time spent: 1 min  [facility] Pager [phone]  If 7AM-7PM, please contact the day team taking care of the patient [email or web address] Password [email or web address] 04/25/2014, 8:09 PM

## 2014-04-25 NOTE — ED Provider Notes (Signed)
CSN: [id number]     Arrival date & time 04/25/14  1602 History   First MD Initiated Contact with Patient 04/25/14 1609     Chief Complaint  Patient presents with  . Altered Mental Status  . Fall  . Hip Pain     (Consider location/radiation/quality/duration/timing/severity/associated sxs/prior Treatment) Patient is a 80 y.o. female presenting with altered mental status, fall, and hip pain. The history is provided by the EMS personnel and a relative. The history is limited by the condition of the patient.  Altered Mental Status Presenting symptoms: confusion   Associated symptoms: no fever, no nausea and no vomiting   Fall Pertinent negatives include no shortness of breath.  Hip Pain Pertinent negatives include no shortness of breath.   history provided by the daughter. Patient has dementia her level V caveat applies to the history. Patient apparently had a fall from standing position yesterday it was unwitnessed but there is any loss of consciousness. Patient has been getting increasingly more confused today. Does have a history of dementia but more confused than usual. Patient lives with her daughter. Urine and noted to be strong odor. Normally patient is verbal and now walks with a cane. Patient has not been able to walk since the fall. Patient seems to be complaining of pain to her left side. Patient's primary care Dr. is Dr. Fredderick Phenix does the home health services.   Level V caveat applies to the historical information all information provided by the daughter. Patient not able to provide any information. Past Medical History  Diagnosis Date  . Hypertension   . TIA (transient ischemic attack)     Declines aggrenox  . Renal insufficiency     Baseline 1.3  . Depression     Improved with remeron  . Rheumatoid arteritis     Previously on MTX x58yr, failed appropiate f/u  . Breast cancer     Hx of DCIS 12/99,  completed XRT 12/20/1998- 02/02/1999 tamoxifen 20 mg once daily March 2002 to June  2006,  all mammograms since then have been negative last April 2009.  Marland Kitchen Anemia, macrocytic      most likely secondary to methotrexate use  . Osteoporosis      T score- 3.0 right hip, -2.8 left hip and -2.1 lumbar spine (April 22, 2007). Not on bisphosphonate because of renal insufficiency.  . 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 Dementia   . Diverticulosis of colon 07/21/2009    severe widely distributed tics on colonoscopy   Past Surgical History  Procedure Laterality Date  . Appendectomy    . Cholecystectomy    . Lung biopsies.  2011    VATS bx:  benign granuloma.   . Esophagogastroduodenoscopy  07/15/2012    Procedure: ESOPHAGOGASTRODUODENOSCOPY (EGD);  Surgeon: Gatha Mayer, MD;  Location: [facility] ENDOSCOPY;  Service: Endoscopy;  Laterality: N/A;   Family History  Problem Relation Age of Onset  . Cancer Sister     Died  . Diabetes Brother   . Coronary artery disease Mother     died age 98  . Diabetes Daughter     died age 73   History  Substance Use Topics  . Smoking status: Former Research scientist (life sciences)  . Smokeless tobacco: Current User    Types: Snuff     Comment: large bag/pack lasts 2 days.  . Alcohol Use: No   OB History   Grav Para Term Preterm Abortions TAB SAB Ect Mult Living  Review of Systems  Unable to perform ROS Constitutional: Positive for activity change and appetite change. Negative for fever.  HENT: Negative for congestion.   Respiratory: Negative for shortness of breath.   Gastrointestinal: Negative for nausea and vomiting.  Psychiatric/Behavioral: Positive for confusion.   otherwise level V caveat applies the review of systems. All information provided by the daughter.    Allergies  Simvastatin  Home Medications   Prior to Admission medications   Medication Sig Start Date End Date Taking? Authorizing Provider  metoprolol tartrate (LOPRESSOR) 12.5 mg TABS Take 0.5 tablets (12.5 mg total) by mouth 2 (two) times daily. 07/16/12    Jerene Pitch, MD  Multiple Minerals-Vitamins (CALCIUM & VIT D3 BONE HEALTH) LIQD Take 500 mg by mouth 2 (two) times daily with a meal. 07/16/12   Jerene Pitch, MD  nitroGLYCERIN (NITROSTAT) 0.4 MG SL tablet Place 0.4 mg under the tongue every 5 (five) minutes as needed. For chest pain    Historical Provider, MD  pantoprazole (PROTONIX) 40 MG tablet TAKE ONE TABLET BY MOUTH EVERY DAY AT 6 AM 10/09/12   Janell Quiet, MD   BP 124/59  Pulse 101  Temp(Src) 99.4 F (37.4 C) (Rectal)  Resp 16  Wt 98 lb (44.453 kg)  SpO2 100% Physical Exam  Nursing note and vitals reviewed. Constitutional: No distress.  Palpation laying with her left side down patient appears very thin somewhat cachectic.  HENT:  Head: Atraumatic.  Mucous membranes dry.  Eyes: Conjunctivae are normal. Pupils are equal, round, and reactive to light.  Neck: Neck supple.  Cardiovascular: Normal rate, regular rhythm and normal heart sounds.   No murmur heard. Tachycardic  Pulmonary/Chest: Effort normal and breath sounds normal. No respiratory distress.  Abdominal: Soft. Bowel sounds are normal. There is no tenderness.  Musculoskeletal: She exhibits no edema.  No obvious deformity. Perhaps some tenderness with movement of the right hip.  Neurological:  The patient is awake but confused. Nonverbal.  Skin: Skin is warm. No rash noted.    ED Course  Procedures (including critical care time) Labs Review Labs Reviewed  URINALYSIS, ROUTINE W REFLEX MICROSCOPIC - Abnormal; Notable for the following:    APPearance HAZY (*)    Bilirubin Urine SMALL (*)    All other components within normal limits  COMPREHENSIVE METABOLIC PANEL - Abnormal; Notable for the following:    Sodium 153 (*)    Potassium 3.5 (*)    Chloride 116 (*)    Glucose, Bld 130 (*)    BUN 45 (*)    Creatinine, Ser 1.22 (*)    Albumin 3.0 (*)    GFR calc non Af Amer 38 (*)    GFR calc Af Amer 44 (*)    All other components within normal limits  CBC WITH  DIFFERENTIAL - Abnormal; Notable for the following:    RBC 3.78 (*)    Hemoglobin 8.1 (*)    HCT 27.7 (*)    MCV 73.3 (*)    MCH 21.4 (*)    MCHC 29.2 (*)    RDW 17.7 (*)    Neutrophils Relative % 86 (*)    Lymphocytes Relative 5 (*)    Neutro Abs 8.9 (*)    Lymphs Abs 0.5 (*)    All other components within normal limits  PRO B NATRIURETIC PEPTIDE - Abnormal; Notable for the following:    Pro B Natriuretic peptide (BNP) 1724.0 (*)    All other components within normal limits  CBG MONITORING, ED -  Abnormal; Notable for the following:    Glucose-Capillary 126 (*)    All other components within normal limits  URINE CULTURE  LIPASE, BLOOD  TROPONIN I  PROTIME-INR  I-STAT CG4 LACTIC ACID, ED   Results for orders placed during the hospital encounter of 04/25/14  URINALYSIS, ROUTINE W REFLEX MICROSCOPIC      Result Value Ref Range   Color, Urine YELLOW  YELLOW   APPearance HAZY (*) CLEAR   Specific Gravity, Urine 1.025  1.005 - 1.030   pH 5.0  5.0 - 8.0   Glucose, UA NEGATIVE  NEGATIVE mg/dL   Hgb urine dipstick NEGATIVE  NEGATIVE   Bilirubin Urine SMALL (*) NEGATIVE   Ketones, ur NEGATIVE  NEGATIVE mg/dL   Protein, ur NEGATIVE  NEGATIVE mg/dL   Urobilinogen, UA 1.0  0.0 - 1.0 mg/dL   Nitrite NEGATIVE  NEGATIVE   Leukocytes, UA NEGATIVE  NEGATIVE  COMPREHENSIVE METABOLIC PANEL      Result Value Ref Range   Sodium 153 (*) 137 - 147 mEq/L   Potassium 3.5 (*) 3.7 - 5.3 mEq/L   Chloride 116 (*) 96 - 112 mEq/L   CO2 20  19 - 32 mEq/L   Glucose, Bld 130 (*) 70 - 99 mg/dL   BUN 45 (*) 6 - 23 mg/dL   Creatinine, Ser 1.22 (*) 0.50 - 1.10 mg/dL   Calcium 10.1  8.4 - 10.5 mg/dL   Total Protein 8.1  6.0 - 8.3 g/dL   Albumin 3.0 (*) 3.5 - 5.2 g/dL   AST 36  0 - 37 U/L   ALT 18  0 - 35 U/L   Alkaline Phosphatase 87  39 - 117 U/L   Total Bilirubin 1.1  0.3 - 1.2 mg/dL   GFR calc non Af Amer 38 (*) >90 mL/min   GFR calc Af Amer 44 (*) >90 mL/min  LIPASE, BLOOD      Result Value  Ref Range   Lipase 42  11 - 59 U/L  CBC WITH DIFFERENTIAL      Result Value Ref Range   WBC 10.3  4.0 - 10.5 K/uL   RBC 3.78 (*) 3.87 - 5.11 MIL/uL   Hemoglobin 8.1 (*) 12.0 - 15.0 g/dL   HCT 27.7 (*) 36.0 - 46.0 %   MCV 73.3 (*) 78.0 - 100.0 fL   MCH 21.4 (*) 26.0 - 34.0 pg   MCHC 29.2 (*) 30.0 - 36.0 g/dL   RDW 17.7 (*) 11.5 - 15.5 %   Platelets 208  150 - 400 K/uL   Neutrophils Relative % 86 (*) 43 - 77 %   Lymphocytes Relative 5 (*) 12 - 46 %   Monocytes Relative 9  3 - 12 %   Eosinophils Relative 0  0 - 5 %   Basophils Relative 0  0 - 1 %   Neutro Abs 8.9 (*) 1.7 - 7.7 K/uL   Lymphs Abs 0.5 (*) 0.7 - 4.0 K/uL   Monocytes Absolute 0.9  0.1 - 1.0 K/uL   Eosinophils Absolute 0.0  0.0 - 0.7 K/uL   Basophils Absolute 0.0  0.0 - 0.1 K/uL   RBC Morphology ELLIPTOCYTES    TROPONIN I      Result Value Ref Range   Troponin I <0.30  <0.30 ng/mL  PRO B NATRIURETIC PEPTIDE      Result Value Ref Range   Pro B Natriuretic peptide (BNP) 1724.0 (*) 0 - 450 pg/mL  PROTIME-INR  Result Value Ref Range   Prothrombin Time 14.7  11.6 - 15.2 seconds   INR 1.17  0.00 - 1.49  CBG MONITORING, ED      Result Value Ref Range   Glucose-Capillary 126 (*) 70 - 99 mg/dL  I-STAT CG4 LACTIC ACID, ED      Result Value Ref Range   Lactic Acid, Venous 1.19  0.5 - 2.2 mmol/L     Imaging Review Dg Chest 1 View  04/25/2014   CLINICAL DATA:  Altered mental status.  Fall.  Dementia.  EXAM: CHEST - 1 VIEW  COMPARISON:  Multiple prior chest radiographs dating back to 09/06/2010.  FINDINGS: Patient rotated to the left. Stable cardiomegaly. Atherosclerotic calcification of the thoracic aortic arch. Chronic mild elevation of the left hemidiaphragm with chronic blunting of the left costophrenic angle. Chain suture noted with in the left lung. Right lung is hyperinflated and clear. Cholecystectomy clips noted. Diffuse osteopenia.  IMPRESSION: Postsurgical changes of the left lung with chronic volume loss,  elevation left hemidiaphragm, and chronic blunting of the left costophrenic angle. No acute findings in the chest.  Stable cardiomegaly.   Electronically Signed   By: Curlene Dolphin M.D.   On: 04/25/2014 17:39   Dg Hip Bilateral W/pelvis  04/25/2014   CLINICAL DATA:  Fall.  Hip pain.  Dementia.  EXAM: BILATERAL HIP WITH PELVIS - 4+ VIEW  COMPARISON:  None.  FINDINGS: The bones are diffusely osteopenic. There is an acute right femoral neck fracture with varus angulation. Slight proximal displacement of the distal fracture fragment. The right hip is located.  The left hip is located and proximal left femur appears intact.  Pelvic ring appears intact.  No fracture or diastasis is identified.  IMPRESSION: Acute right femoral neck fracture.   Electronically Signed   By: Curlene Dolphin M.D.   On: 04/25/2014 17:43   Ct Head Wo Contrast  04/25/2014   CLINICAL DATA:  80 year old female with altered mental status fall and hip pain. Initial encounter.  EXAM: CT HEAD WITHOUT CONTRAST  CT CERVICAL SPINE WITHOUT CONTRAST  TECHNIQUE: Multidetector CT imaging of the head and cervical spine was performed following the standard protocol without intravenous contrast. Multiplanar CT image reconstructions of the cervical spine were also generated.  COMPARISON:  Head CT 11/22/2007.  Brain MRI 11/23/2007.  FINDINGS: CT HEAD FINDINGS  Study is intermittently degraded by motion artifact despite repeated imaging attempts.  Focal left forehead scalp hematoma measures up to 8 mm in thickness. Underlying left frontal bone is intact. No other scalp hematoma identified. Grossly stable and clear paranasal sinuses and mastoids. Grossly negative orbits soft tissues.  Calcified atherosclerosis at the skull base. Cerebral volume is not significantly changed since 2009. No ventriculomegaly. No midline shift, mass effect, or evidence of intracranial mass lesion. No acute intracranial hemorrhage identified. No evidence of cortically based acute  infarction identified.  CT CERVICAL SPINE FINDINGS  Study is moderately to severely degraded by motion artifact despite repeated imaging attempts.  Reformatted images are not very useful on this study related to the motion. Axial images suggest no definite acute cervical spine fracture. Grossly intact visualized upper thoracic levels. Negative lung apices except for scarring and emphysema. Grossly negative paraspinal soft tissues.  IMPRESSION: 1. No definite acute intracranial abnormality and stable noncontrast CT appearance of the brain since 2009 allowing for motion artifact. 2. Motion degraded cervical spine CT, with no definite acute cervical spine fracture. If neck pain persists, a repeat cervical spine CT is  recommended when the patient can better cooperate.   Electronically Signed   By: Lars Pinks M.D.   On: 04/25/2014 17:24   Ct Cervical Spine Wo Contrast  04/25/2014   CLINICAL DATA:  80 year old female with altered mental status fall and hip pain. Initial encounter.  EXAM: CT HEAD WITHOUT CONTRAST  CT CERVICAL SPINE WITHOUT CONTRAST  TECHNIQUE: Multidetector CT imaging of the head and cervical spine was performed following the standard protocol without intravenous contrast. Multiplanar CT image reconstructions of the cervical spine were also generated.  COMPARISON:  Head CT 11/22/2007.  Brain MRI 11/23/2007.  FINDINGS: CT HEAD FINDINGS  Study is intermittently degraded by motion artifact despite repeated imaging attempts.  Focal left forehead scalp hematoma measures up to 8 mm in thickness. Underlying left frontal bone is intact. No other scalp hematoma identified. Grossly stable and clear paranasal sinuses and mastoids. Grossly negative orbits soft tissues.  Calcified atherosclerosis at the skull base. Cerebral volume is not significantly changed since 2009. No ventriculomegaly. No midline shift, mass effect, or evidence of intracranial mass lesion. No acute intracranial hemorrhage identified. No evidence  of cortically based acute infarction identified.  CT CERVICAL SPINE FINDINGS  Study is moderately to severely degraded by motion artifact despite repeated imaging attempts.  Reformatted images are not very useful on this study related to the motion. Axial images suggest no definite acute cervical spine fracture. Grossly intact visualized upper thoracic levels. Negative lung apices except for scarring and emphysema. Grossly negative paraspinal soft tissues.  IMPRESSION: 1. No definite acute intracranial abnormality and stable noncontrast CT appearance of the brain since 2009 allowing for motion artifact. 2. Motion degraded cervical spine CT, with no definite acute cervical spine fracture. If neck pain persists, a repeat cervical spine CT is recommended when the patient can better cooperate.   Electronically Signed   By: Lars Pinks M.D.   On: 04/25/2014 17:24     EKG Interpretation None      Date: 04/25/2014  Rate: 110  Rhythm: sinus tachycardia and premature atrial contractions (PAC)  QRS Axis: normal  Intervals: normal  ST/T Wave abnormalities: nonspecific ST/T changes  Conduction Disutrbances:none  Narrative Interpretation:   Old EKG Reviewed: none available        MDM   Final diagnoses:  Fracture of femoral neck, right  Dehydration  Hypernatremia  Dementia    The patient normally walks with a cane she had a fall yesterday since that time she hasn't been her usual self she does have dementia but she's been more confused. Not able to get back up on her feet. Daughter noted a strong urine smell. Patient brought in for the altered mental status and due to the fall.  Workup here without any evidence of a head injury chest x-rays negative for pneumonia or congestive heart failure. Electrolytes showed dehydration with hypernatremia. Urinalysis was negative for urinary tract infection. X-rays of her hip show a right femoral neck fracture.  Patient will require admission will require  admission by hospitalist service in consultation by orthopedics.    Fredia Sorrow, MD 04/25/14 [phone]

## 2014-04-26 DIAGNOSIS — E870 Hyperosmolality and hypernatremia: Secondary | ICD-10-CM

## 2014-04-26 DIAGNOSIS — F0390 Unspecified dementia without behavioral disturbance: Secondary | ICD-10-CM

## 2014-04-26 DIAGNOSIS — E860 Dehydration: Secondary | ICD-10-CM

## 2014-04-26 LAB — CBC
HEMATOCRIT: 22.4 % — AB (ref 36.0–46.0)
Hemoglobin: 6.7 g/dL — CL (ref 12.0–15.0)
MCH: 22.2 pg — AB (ref 26.0–34.0)
MCHC: 29.9 g/dL — AB (ref 30.0–36.0)
MCV: 74.2 fL — ABNORMAL LOW (ref 78.0–100.0)
Platelets: 163 10*3/uL (ref 150–400)
RBC: 3.02 MIL/uL — ABNORMAL LOW (ref 3.87–5.11)
RDW: 17.8 % — ABNORMAL HIGH (ref 11.5–15.5)
WBC: 7 10*3/uL (ref 4.0–10.5)

## 2014-04-26 LAB — SURGICAL PCR SCREEN
MRSA, PCR: NEGATIVE
Staphylococcus aureus: NEGATIVE

## 2014-04-26 LAB — PREPARE RBC (CROSSMATCH)

## 2014-04-26 LAB — BASIC METABOLIC PANEL
BUN: 47 mg/dL — ABNORMAL HIGH (ref 6–23)
CALCIUM: 9.3 mg/dL (ref 8.4–10.5)
CO2: 19 mEq/L (ref 19–32)
Chloride: 117 mEq/L — ABNORMAL HIGH (ref 96–112)
Creatinine, Ser: 1.1 mg/dL (ref 0.50–1.10)
GFR calc Af Amer: 50 mL/min — ABNORMAL LOW (ref >90)
GFR, EST NON AFRICAN AMERICAN: 43 mL/min — AB (ref >90)
Glucose, Bld: 129 mg/dL — ABNORMAL HIGH (ref 70–99)
Potassium: 3.4 mEq/L — ABNORMAL LOW (ref 3.7–5.3)
SODIUM: 152 meq/L — AB (ref 137–147)

## 2014-04-26 LAB — HEMOGLOBIN AND HEMATOCRIT, BLOOD
HCT: 30.7 % — ABNORMAL LOW (ref 36.0–46.0)
Hemoglobin: 9.3 g/dL — ABNORMAL LOW (ref 12.0–15.0)

## 2014-04-26 MED ORDER — POTASSIUM CHLORIDE CRYS ER 20 MEQ PO TBCR
40.0000 meq | EXTENDED_RELEASE_TABLET | Freq: Once | ORAL | Status: AC
  Administered 2014-04-26: 40 meq via ORAL
  Filled 2014-04-26: qty 2

## 2014-04-26 MED ORDER — ENSURE COMPLETE PO LIQD
237.0000 mL | Freq: Two times a day (BID) | ORAL | Status: DC
  Administered 2014-04-27 – 2014-05-01 (×6): 237 mL via ORAL

## 2014-04-26 NOTE — Progress Notes (Signed)
INITIAL NUTRITION ASSESSMENT  DOCUMENTATION CODES Per approved criteria  -Severe malnutrition in the context of chronic illness -Underweight   INTERVENTION: Ensure Complete po BID, each supplement provides 350 kcal and 13 grams of protein  NUTRITION DIAGNOSIS: Malnutrition related to chronic illness/dementia as evidenced by severe fat and muscle wasting.   Goal: Pt to meet >/= 90% of their estimated nutrition needs   Monitor:  PO intake, supplement acceptance, weight trend, labs   Reason for Assessment: MD Consult  80 y.o. female  Admitting Dx: Fracture of femoral neck, right  ASSESSMENT: 80 year old female with past medical history of severe dementia, depression who presented to [facility] ED 04/25/2014 status post fall at home. Pt lives with daughter.  Pt unable to answer any questions, no family present.   Nutrition Focused Physical Exam:  Subcutaneous Fat:  Orbital Region: severe depletion  Upper Arm Region: severe depletion  Thoracic and Lumbar Region: severe depletion   Muscle:  Temple Region: severe depletion Clavicle Bone Region: severe depletion  Clavicle and Acromion Bone Region: severe depletion  Scapular Bone Region: severe depletion  Dorsal Hand: severe depletion Patellar Region: severe depletion  Anterior Thigh Region: severe depletion  Posterior Calf Region: severe depletion   Edema: not present   Height: Ht Readings from Last 1 Encounters:  07/12/12 5\' 6"  (1.676 m)    Weight: Wt Readings from Last 1 Encounters:  04/25/14 98 lb (44.453 kg)    Ideal Body Weight: 59 kg   % Ideal Body Weight: 75%  Wt Readings from Last 10 Encounters:  04/25/14 98 lb (44.453 kg)  07/12/12 134 lb 0.6 oz (60.8 kg)  07/12/12 134 lb 0.6 oz (60.8 kg)  07/12/12 134 lb 0.6 oz (60.8 kg)  02/05/12 129 lb 11.2 oz (58.832 kg)  10/29/11 121 lb 3.2 oz (54.976 kg)  10/09/11 120 lb 14.4 oz (54.84 kg)  06/18/11 116 lb 8 oz (52.844 kg)  01/07/11 122 lb (55.339 kg)  10/01/10  129 lb 14.4 oz (58.922 kg)    Usual Body Weight: na  % Usual Body Weight: -  BMI:  15.8 - underweight  Estimated Nutritional Needs: Kcal: 1300-1500 Protein: 65-75 grams Fluid: > 1.5 L/day  Skin: no issues noted  Diet Order: Criss Rosales  EDUCATION NEEDS: -No education needs identified at this time   Intake/Output Summary (Last 24 hours) at 04/26/14 1609 Last data filed at 04/26/14 1035  Gross per 24 hour  Intake    900 ml  Output      0 ml  Net    900 ml    Last BM: PTA   Labs:   Recent Labs Lab 04/25/14 1640 04/26/14 0525  NA 153* 152*  K 3.5* 3.4*  CL 116* 117*  CO2 20 19  BUN 45* 47*  CREATININE 1.22* 1.10  CALCIUM 10.1 9.3  GLUCOSE 130* 129*    CBG (last 3)   Recent Labs  04/25/14 1737  GLUCAP 126*    Scheduled Meds: . aspirin EC  325 mg Oral Daily  . donepezil  5 mg Oral QHS  . QUEtiapine  50 mg Oral QHS  . risperiDONE  0.5 mg Oral BID    Continuous Infusions: . sodium chloride      Past Medical History  Diagnosis Date  . Hypertension   . TIA (transient ischemic attack)     Declines aggrenox  . Renal insufficiency     Baseline 1.3  . Depression     Improved with remeron  . Rheumatoid  arteritis     Previously on MTX x37yr, failed appropiate f/u  . 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  Marland Kitchen Anemia, macrocytic      most likely secondary to methotrexate use  . Osteoporosis      T score- 3.0 right hip, -2.8 left hip and -2.1 lumbar spine (April 22, 2007). Not on bisphosphonate because of renal insufficiency.  . 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 Dementia   . Diverticulosis of colon 07/21/2009    severe widely distributed tics on colonoscopy    Past Surgical History  Procedure Laterality Date  . Appendectomy    . Cholecystectomy    . Lung biopsies.  2011    VATS bx:  benign  granuloma.   . Esophagogastroduodenoscopy  07/15/2012    Procedure: ESOPHAGOGASTRODUODENOSCOPY (EGD);  Surgeon: Gatha Mayer, MD;  Location: [facility] ENDOSCOPY;  Service: Endoscopy;  Laterality: N/A;    Maylon Peppers RD, [place], [place] Pager [phone] After Hours Pager

## 2014-04-26 NOTE — Progress Notes (Signed)
Subjective:   Procedure(s) (LRB): ARTHROPLASTY BIPOLAR HIP (Right)  Activity level:  bedrest Diet tolerance:  NPO currently Voiding:  well Patient reports pain as moderate.    Objective: Vital signs in last 24 hours: Temp:  [98 F (36.7 C)-99.4 F (37.4 C)] 98.5 F (36.9 C) (06/09 1035) Pulse Rate:  [73-108] 88 (06/09 1035) Resp:  [16-24] 18 (06/09 1200) BP: (95-140)/(47-84) 102/65 mmHg (06/09 1035) SpO2:  [93 %-100 %] 95 % (06/09 1035) Weight:  [44.453 kg (98 lb)] 44.453 kg (98 lb) (06/08 1619)  Labs:  Recent Labs  04/25/14 1640 04/26/14 0525  HGB 8.1* 6.7*    Recent Labs  04/25/14 1640 04/26/14 0525  WBC 10.3 7.0  RBC 3.78* 3.02*  HCT 27.7* 22.4*  PLT 208 163    Recent Labs  04/25/14 1640 04/26/14 0525  NA 153* 152*  K 3.5* 3.4*  CL 116* 117*  CO2 20 19  BUN 45* 47*  CREATININE 1.22* 1.10  GLUCOSE 130* 129*  CALCIUM 10.1 9.3    Recent Labs  04/25/14 1640  INR 1.17    Physical Exam:  Neurologically intact ABD soft Sensation intact distally Intact pulses distally Dorsiflexion/Plantar flexion intact Compartment soft  Assessment/Plan:    Procedure(s) (LRB): ARTHROPLASTY BIPOLAR HIP (Right) planned for Thursday  Very anemic and receiving one unit currently Will likely need several more units to get to OR safely Should be ready by Thursday. Spoke with daughter about situation SNF postop likely as well and plans being made Advance diet as we will put off surgery until thursday    Katherine [name] 04/26/2014, 1:36 PM

## 2014-04-26 NOTE — Progress Notes (Signed)
Orthopedic Tech Progress Note Patient Details:  Katherine [name] Apr 20, 1923 [id number]  Patient ID: Katherine [name], female   DOB: 07-Mar-1923, 80 y.o.   MRN: [id number]   Katherine [name] 04/26/2014, 2:44 PMTrapeze bar

## 2014-04-26 NOTE — Progress Notes (Signed)
Patient ID: Katherine [name], female   DOB: 01-23-1923, 80 y.o.   MRN: [id number] [facility] PROGRESS NOTE  HALLY COLELLA SKA:[id number] DOB: 01/16/1923 DOA: 04/25/2014 PCP: Default, Provider, MD  Brief narrative: 80 year old female with past medical history of dementia, depression who presented to [facility] ED 04/25/2014 status post fall at home. She was also apparently more confused per her daughter. Family is not present at the bedside to give more details of patient's medical history. On admission, pt was found to have right femoral neck fracture. CT head and cervical spine did not show acute intracranial findings. CXR showed postsurgical changes of the left lung with chronic volume loss, elevation left hemidiaphragm, and chronic blunting of the left costophrenic angle however no acute findings in the chest. UA did not show leukocytes or nitrites.   Assessment/Plan:  Principal Problem:   Fracture of femoral neck, right  Appreciate ortho input; possible surgery today  Medically she stable to undergo surgery  Her Hgb is 6.7 and she is getting 1 unit PRBC   PT eval once pt able to participate  Active Problems:   Iron deficiency anemia  Hemoglobin is 6.7 today and she is receiving 1 unit PRBC blood transfusion   She has history of gastric antral vascular ectasia  Moderate protein calorie malnutrition,  Nutrition consulted    Hypernatremia  Secondary to dehydration  Sodium 153 --> 152  Continue IV fluids    Acute encephalopathy, depression, dementia  Continue Aricept, Seroquel and Risperidone  DVT prophylaxis: on aspirin only prior to surgery   Code Status: full code  Family Communication: plan of care discussed with the patient; family not at the bedside this am; RN present in the room Disposition Plan: remains inpatient   Robbie Lis, MD  [facility] Pager [phone]  If 7PM-7AM, please contact night-coverage [email or web address] Password [email or web address] 04/26/2014, 12:35  PM   LOS: 1 day   Consultants:  Orthopedic surgery   Procedures:  None   Antibiotics:  None   HPI/Subjective: No acute overnight events.  Objective: Filed Vitals:   04/26/14 0601 04/26/14 0800 04/26/14 1020 04/26/14 1035  BP: 109/62  105/59 102/65  Pulse: 91  73 88  Temp: 98.4 F (36.9 C)  98.2 F (36.8 C) 98.5 F (36.9 C)  TempSrc: Oral  Oral Oral  Resp: 18 20 18 20   Weight:      SpO2: 99%  93% 95%    Intake/Output Summary (Last 24 hours) at 04/26/14 1235 Last data filed at 04/26/14 1035  Gross per 24 hour  Intake    900 ml  Output      0 ml  Net    900 ml    Exam:   General:  Pt is alert, confused, no distress  Cardiovascular: Regular rate and rhythm, S1/S2 appreciated   Respiratory: Clear to auscultation bilaterally, no wheezing, no crackles, no rhonchi  Abdomen: Soft, non tender, non distended, bowel sounds present  Extremities: No edema, pulses DP and PT palpable bilaterally  Neuro: Grossly nonfocal  Data Reviewed: Basic Metabolic Panel:  Recent Labs Lab 04/25/14 1640 04/26/14 0525  NA 153* 152*  K 3.5* 3.4*  CL 116* 117*  CO2 20 19  GLUCOSE 130* 129*  BUN 45* 47*  CREATININE 1.22* 1.10  CALCIUM 10.1 9.3   Liver Function Tests:  Recent Labs Lab 04/25/14 1640  AST 36  ALT 18  ALKPHOS 87  BILITOT 1.1  PROT 8.1  ALBUMIN 3.0*    Recent  Labs Lab 04/25/14 1640  LIPASE 42   No results found for this basename: AMMONIA,  in the last 168 hours CBC:  Recent Labs Lab 04/25/14 1640 04/26/14 0525  WBC 10.3 7.0  NEUTROABS 8.9*  --   HGB 8.1* 6.7*  HCT 27.7* 22.4*  MCV 73.3* 74.2*  PLT 208 163   Cardiac Enzymes:  Recent Labs Lab 04/25/14 1640  TROPONINI <0.30   BNP: No components found with this basename: POCBNP,  CBG:  Recent Labs Lab 04/25/14 1737  GLUCAP 126*    SURGICAL PCR SCREEN     Status: None   Collection Time    04/26/14  5:57 AM      Result Value Ref Range Status   MRSA, PCR NEGATIVE   NEGATIVE Final   Staphylococcus aureus NEGATIVE  NEGATIVE Final     Studies: Dg Chest 1 View 04/25/2014     IMPRESSION: Postsurgical changes of the left lung with chronic volume loss, elevation left hemidiaphragm, and chronic blunting of the left costophrenic angle. No acute findings in the chest.  Stable cardiomegaly.    Dg Hip Bilateral W/pelvis  04/25/2014  IMPRESSION: Acute right femoral neck fracture.    Ct Head Wo Contrast 04/25/2014    IMPRESSION: 1. No definite acute intracranial abnormality and stable noncontrast CT appearance of the brain since 2009 allowing for motion artifact. 2. Motion degraded cervical spine CT, with no definite acute cervical spine fracture. If neck pain persists, a repeat cervical spine CT is recommended when the patient can better cooperate.     Ct Cervical Spine Wo Contrast 04/25/2014    IMPRESSION: 1. No definite acute intracranial abnormality and stable noncontrast CT appearance of the brain since 2009 allowing for motion artifact. 2. Motion degraded cervical spine CT, with no definite acute cervical spine fracture. If neck pain persists, a repeat cervical spine CT is recommended when the patient can better cooperate.      Scheduled Meds: . aspirin EC  325 mg Oral Daily  . donepezil  5 mg Oral QHS  . QUEtiapine  50 mg Oral QHS  . risperiDONE  0.5 mg Oral BID   Continuous Infusions: . sodium chloride

## 2014-04-26 NOTE — Care Management Note (Signed)
CARE MANAGEMENT NOTE 04/26/2014  Patient:  ZIGGY, BERL   Account Number:  [id number]  Date Initiated:  04/26/2014  Documentation initiated by:  Ricki Miller  Subjective/Objective Assessment:   80 yr old female s/p Left BKA. 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     Action/Plan:   Case manager spoke with patient and husband concerning home health versus Rehab.Case Manager will continue to follow.   Anticipated DC Date:     Anticipated DC Plan:        DC Planning Services  CM consult      Choice offered to / List presented to:             Status of service:  In process, will continue to follow

## 2014-04-26 NOTE — Progress Notes (Signed)
CSW contacted patient's daughter. Patient's daughter is requesting to speak with CSW in person. She will notify staff when she arrives at the hospital.  Rhea Pink, MSW, [place]

## 2014-04-26 NOTE — Progress Notes (Signed)
Utilization review completed.  

## 2014-04-27 ENCOUNTER — Encounter (HOSPITAL_COMMUNITY): Payer: Self-pay | Admitting: General Practice

## 2014-04-27 DIAGNOSIS — S72009A Fracture of unspecified part of neck of unspecified femur, initial encounter for closed fracture: Secondary | ICD-10-CM

## 2014-04-27 DIAGNOSIS — E860 Dehydration: Secondary | ICD-10-CM

## 2014-04-27 DIAGNOSIS — E41 Nutritional marasmus: Secondary | ICD-10-CM

## 2014-04-27 DIAGNOSIS — E43 Unspecified severe protein-calorie malnutrition: Secondary | ICD-10-CM

## 2014-04-27 HISTORY — DX: Fracture of unspecified part of neck of unspecified femur, initial encounter for closed fracture: S72.009A

## 2014-04-27 LAB — URINE CULTURE
COLONY COUNT: NO GROWTH
CULTURE: NO GROWTH

## 2014-04-27 LAB — BASIC METABOLIC PANEL
BUN: 40 mg/dL — ABNORMAL HIGH (ref 6–23)
CALCIUM: 9.6 mg/dL (ref 8.4–10.5)
CO2: 21 meq/L (ref 19–32)
Chloride: 119 mEq/L — ABNORMAL HIGH (ref 96–112)
Creatinine, Ser: 0.93 mg/dL (ref 0.50–1.10)
GFR calc Af Amer: 61 mL/min — ABNORMAL LOW (ref >90)
GFR, EST NON AFRICAN AMERICAN: 53 mL/min — AB (ref >90)
GLUCOSE: 164 mg/dL — AB (ref 70–99)
POTASSIUM: 4.9 meq/L (ref 3.7–5.3)
SODIUM: 152 meq/L — AB (ref 137–147)

## 2014-04-27 MED ORDER — SODIUM CHLORIDE 0.45 % IV SOLN
INTRAVENOUS | Status: DC
  Administered 2014-04-27: 75 mL/h via INTRAVENOUS
  Administered 2014-04-29 (×2): via INTRAVENOUS

## 2014-04-27 MED ORDER — BISACODYL 10 MG RE SUPP: 10.0000 mg | Freq: Every day | RECTAL | Status: DC | PRN

## 2014-04-27 NOTE — Progress Notes (Signed)
CSW met with patient and patient's daughter at bedside. CSW offered support and discussed discharge disposition. After a lengthy discussion weighing the pros and cons of SNF vs. Home, patient's daughter reported that she would like her mother to come home. Patient's daughter quit her job and is a 24 hour caregiver for the patient. She feels that seh can provide better care/24 hour care than a facility could provide. Patient's daughter also feels that it would be mentally taxing on the patient to be outside her normal environment. CSW has informed the CM of the patient's daughter's decision. Clinical Social Worker will sign off for now as social work intervention is no longer needed. Please consult [place] again if new need arises.   Rhea Pink, MSW, [place]

## 2014-04-27 NOTE — Progress Notes (Signed)
Subjective:   Procedure(s) (LRB): ARTHROPLASTY BIPOLAR HIP (Right)  Activity level:  bedrest Diet tolerance:  Per medical team Voiding:  well Patient reports pain as mild and moderate.    Patient slightly more alert today after receiving blood. Still very demented.   Objective: Vital signs in last 24 hours: Temp:  [98.2 F (36.8 C)-98.7 F (37.1 C)] 98.6 F (37 C) (06/10 0605) Pulse Rate:  [73-95] 95 (06/10 0605) Resp:  [18-20] 18 (06/10 0605) BP: (102-125)/(59-72) 125/72 mmHg (06/10 0605) SpO2:  [93 %-99 %] 99 % (06/10 0605)  Labs:  Recent Labs  04/25/14 1640 04/26/14 0525 04/26/14 1320  HGB 8.1* 6.7* 9.3*    Recent Labs  04/25/14 1640 04/26/14 0525 04/26/14 1320  WBC 10.3 7.0  --   RBC 3.78* 3.02*  --   HCT 27.7* 22.4* 30.7*  PLT 208 163  --     Recent Labs  04/25/14 1640 04/26/14 0525  NA 153* 152*  K 3.5* 3.4*  CL 116* 117*  CO2 20 19  BUN 45* 47*  CREATININE 1.22* 1.10  GLUCOSE 130* 129*  CALCIUM 10.1 9.3    Recent Labs  04/25/14 1640  INR 1.17    Physical Exam:  Neurovascular intact Sensation intact distally Intact pulses distally Dorsiflexion/Plantar flexion intact Compartment soft  Assessment/Plan:    Procedure(s) (LRB): ARTHROPLASTY BIPOLAR HIP (Right) planned for thursday Seems to be doing better after blood transfusion. Plan on surgery Thursday Post op SNF likely. NPO starting tonight for surgery tomorrow.    Faiza Bansal, Larwance Sachs 04/27/2014, 8:28 AM

## 2014-04-27 NOTE — Progress Notes (Signed)
PROGRESS NOTE  Katherine [name] QIH:[id number] DOB: [birth year] DOA: 04/25/2014 PCP: Default, Provider, MD  Summary: 71yow presented to ED with h/o confusion above basdeline, h/o fall 1 day prior to presentation, non-ambulatory since. Workup revealed right femoral neck fracture. CT head and cervical spine did not show acute intracranial findings. CXR showed postsurgical changes of the left lung with chronic volume loss, elevation left hemidiaphragm, and chronic blunting of the left costophrenic angle however no acute findings in the chest. UA did not show leukocytes or nitrites.   Assessment/Plan: 1. Right femoral neck fracture. Operative intervention 0.6/11. 2. Encephalopathy, suspect subacute, complicated by fracture. No evidence of infection. 3. Dementia, depression on Aricept, Seroquel and Risperdal. Appears stable, alert and cooperative. 4. Microcytic anemia with history of iron deficiency anemia as well as AVM/GAVE with ablation by EGD 06/2012. Hemoglobin much improved after one unit packed red blood cells. No evidence of ongoing bleeding. Monitor clinically. 5. Hypernatremia, dehydration. Likely secondary to poor oral intake. 6. H/o RA 7. H/o NSTEMI 06/2012 in setting of significant anemia Hgb 5.3, medical management recommened with no plans for interventional investigation. Appears to be asymptomatic with no signs or symptoms of ACS. Follow conservatively. 8. Severe malnutrition in the context of chronic illness. History of breast cancer. Followup as an outpatient. 9. Underweight   Management of fracture per orthopedics. On SCDs for prophylaxis given anemia requiring transfusion.  IVF, BMP today  BMP and CBC in AM  Code Status: full code DVT prophylaxis: SCDs (anemia) Family Communication: none present Disposition Plan: likely SNF  Murray Hodgkins, MD  [facility]  Pager [phone] If 7PM-7AM, please contact night-coverage at [email or web address], password [facility] 04/27/2014, 8:37 AM   LOS: 2 days   Consultants:  Orthopedics   Procedures:  Transfusion PRBC 6/9  Antibiotics:    HPI/Subjective: No issues overnight per RN. May be constipated.  Patient denies complaints but has advanced dementia, history unreliable.  Objective: Filed Vitals:   04/26/14 1305 04/26/14 1600 04/26/14 2148 04/27/14 0605  BP: 117/62  120/65 125/72  Pulse: 93  95 95  Temp: 98.2 F (36.8 C)  98.7 F (37.1 C) 98.6 F (37 C)  TempSrc: Oral     Resp: 18 20 18 18   Weight:      SpO2: 96%  99% 99%    Intake/Output Summary (Last 24 hours) at 04/27/14 0837 Last data filed at 04/27/14 0700  Gross per 24 hour  Intake   1280 ml  Output      0 ml  Net   1280 ml     Filed Weights   04/25/14 1619  Weight: 44.453 kg (98 lb)    Exam:   Afebrile, VSS, no hypoxia Gen. Appears calm and comfortable. Nontoxic. Psych. Appears confused. Follows commands. Moves all extremities. Answers simple questions. Eyes. Pupils equal, round, react to light. ENT. Lips and tongue appear unremarkable. Neck. Appears unremarkable. Cardiovascular. Regular rate and rhythm. No murmur, rub or gallop. Respiratory. CTA bilaterally. No wheezes, rales or rhonchi. Normal respiratory effort Musculoskeletal. Moves all extremities well. Grossly normal tone. Neurologic. Grossly nonfocal but limited compliance with examination.  Data Reviewed:  Hgb 9.3   UC NG/final  Scheduled Meds: . aspirin EC  325 mg Oral Daily  . donepezil  5 mg Oral QHS  . feeding supplement (ENSURE COMPLETE)  237 mL Oral BID BM  . QUEtiapine  50 mg Oral QHS  . risperiDONE  0.5 mg Oral BID   Continuous Infusions: . sodium chloride  Principal Problem:   Fracture of femoral neck, right Active Problems:   Iron deficiency anemia secondary to blood loss (chronic)   Gastric antral vascular ectasia (watermelon stomach)   Malnutrition   Hypernatremia   Dehydration   Severe malnutrition   Time spent 20  minutes

## 2014-04-28 ENCOUNTER — Encounter (HOSPITAL_COMMUNITY)
Admission: EM | Disposition: A | Discharge status: Home Health | Payer: Self-pay | Source: Home / Self Care | Attending: Family Medicine

## 2014-04-28 ENCOUNTER — Inpatient Hospital Stay (HOSPITAL_COMMUNITY): Payer: PRIVATE HEALTH INSURANCE | Admitting: Anesthesiology

## 2014-04-28 ENCOUNTER — Encounter (HOSPITAL_COMMUNITY): Payer: Self-pay | Admitting: Physician Assistant

## 2014-04-28 ENCOUNTER — Encounter (HOSPITAL_COMMUNITY): Payer: PRIVATE HEALTH INSURANCE | Admitting: Anesthesiology

## 2014-04-28 DIAGNOSIS — E860 Dehydration: Secondary | ICD-10-CM | POA: Diagnosis not present

## 2014-04-28 DIAGNOSIS — S72009A Fracture of unspecified part of neck of unspecified femur, initial encounter for closed fracture: Secondary | ICD-10-CM | POA: Diagnosis not present

## 2014-04-28 HISTORY — PX: HIP ARTHROPLASTY: SHX981

## 2014-04-28 LAB — CBC
HCT: 24.4 % — ABNORMAL LOW (ref 36.0–46.0)
HEMATOCRIT: 35.2 % — AB (ref 36.0–46.0)
Hemoglobin: 7.1 g/dL — ABNORMAL LOW (ref 12.0–15.0)
Hemoglobin: 11.1 g/dL — ABNORMAL LOW (ref 12.0–15.0)
MCH: 22.2 pg — ABNORMAL LOW (ref 26.0–34.0)
MCH: 25.1 pg — AB (ref 26.0–34.0)
MCHC: 29.1 g/dL — ABNORMAL LOW (ref 30.0–36.0)
MCHC: 31.5 g/dL (ref 30.0–36.0)
MCV: 76.3 fL — ABNORMAL LOW (ref 78.0–100.0)
MCV: 79.5 fL (ref 78.0–100.0)
Platelets: 155 10*3/uL (ref 150–400)
Platelets: 136 10*3/uL — ABNORMAL LOW (ref 150–400)
RBC: 3.2 MIL/uL — AB (ref 3.87–5.11)
RBC: 4.43 MIL/uL (ref 3.87–5.11)
RDW: 18.1 % — ABNORMAL HIGH (ref 11.5–15.5)
RDW: 17.4 % — ABNORMAL HIGH (ref 11.5–15.5)
WBC: 6.4 10*3/uL (ref 4.0–10.5)
WBC: 8.6 10*3/uL (ref 4.0–10.5)

## 2014-04-28 LAB — BASIC METABOLIC PANEL
BUN: 33 mg/dL — ABNORMAL HIGH (ref 6–23)
CO2: 20 mEq/L (ref 19–32)
Calcium: 9.1 mg/dL (ref 8.4–10.5)
Chloride: 114 mEq/L — ABNORMAL HIGH (ref 96–112)
Creatinine, Ser: 0.93 mg/dL (ref 0.50–1.10)
GFR, EST AFRICAN AMERICAN: 61 mL/min — AB (ref >90)
GFR, EST NON AFRICAN AMERICAN: 53 mL/min — AB (ref >90)
GLUCOSE: 105 mg/dL — AB (ref 70–99)
POTASSIUM: 4.5 meq/L (ref 3.7–5.3)
SODIUM: 147 meq/L (ref 137–147)

## 2014-04-28 LAB — PREPARE RBC (CROSSMATCH)

## 2014-04-28 LAB — OCCULT BLOOD X 1 CARD TO LAB, STOOL: FECAL OCCULT BLD: NEGATIVE

## 2014-04-28 SURGERY — HEMIARTHROPLASTY, HIP, DIRECT ANTERIOR APPROACH, FOR FRACTURE
Anesthesia: General | Site: Hip | Laterality: Right

## 2014-04-28 MED ORDER — FENTANYL CITRATE 0.05 MG/ML IJ SOLN
25.0000 ug | INTRAMUSCULAR | Status: DC | PRN
  Administered 2014-04-28: 25 ug via INTRAVENOUS

## 2014-04-28 MED ORDER — PHENOL 1.4 % MT LIQD: 1.0000 | OROMUCOSAL | Status: DC | PRN

## 2014-04-28 MED ORDER — PROPOFOL 10 MG/ML IV BOLUS
INTRAVENOUS | Status: DC | PRN
  Administered 2014-04-28: 60 mg via INTRAVENOUS

## 2014-04-28 MED ORDER — SUCCINYLCHOLINE CHLORIDE 20 MG/ML IJ SOLN
INTRAMUSCULAR | Status: AC
  Filled 2014-04-28: qty 1

## 2014-04-28 MED ORDER — ONDANSETRON HCL 4 MG/2ML IJ SOLN: 4.0000 mg | Freq: Four times a day (QID) | INTRAMUSCULAR | Status: DC | PRN

## 2014-04-28 MED ORDER — FENTANYL CITRATE 0.05 MG/ML IJ SOLN
INTRAMUSCULAR | Status: AC
  Filled 2014-04-28: qty 5

## 2014-04-28 MED ORDER — POLYETHYLENE GLYCOL 3350 17 G PO PACK
17.0000 g | PACK | Freq: Every day | ORAL | Status: DC
  Administered 2014-04-28 – 2014-04-30 (×3): 17 g via ORAL
  Filled 2014-04-28 (×3): qty 1

## 2014-04-28 MED ORDER — IRON DEXTRAN 50 MG/ML IJ SOLN: 1000.0000 mg | Freq: Once | INTRAMUSCULAR | Status: DC

## 2014-04-28 MED ORDER — FENTANYL CITRATE 0.05 MG/ML IJ SOLN
INTRAMUSCULAR | Status: DC | PRN
  Administered 2014-04-28: 100 ug via INTRAVENOUS

## 2014-04-28 MED ORDER — LIDOCAINE HCL (CARDIAC) 20 MG/ML IV SOLN
INTRAVENOUS | Status: DC | PRN
  Administered 2014-04-28: 50 mg via INTRAVENOUS

## 2014-04-28 MED ORDER — METOCLOPRAMIDE HCL 10 MG PO TABS: 5.0000 mg | ORAL_TABLET | Freq: Three times a day (TID) | ORAL | Status: DC | PRN

## 2014-04-28 MED ORDER — LACTATED RINGERS IV SOLN
INTRAVENOUS | Status: DC | PRN
  Administered 2014-04-28: 13:00:00 via INTRAVENOUS

## 2014-04-28 MED ORDER — CEFAZOLIN SODIUM-DEXTROSE 2-3 GM-% IV SOLR: 2.0000 g | Freq: Four times a day (QID) | INTRAVENOUS | Status: DC

## 2014-04-28 MED ORDER — FENTANYL CITRATE 0.05 MG/ML IJ SOLN
INTRAMUSCULAR | Status: AC
  Administered 2014-04-28: 17:00:00
  Filled 2014-04-28: qty 2

## 2014-04-28 MED ORDER — ONDANSETRON HCL 4 MG PO TABS: 4.0000 mg | ORAL_TABLET | Freq: Four times a day (QID) | ORAL | Status: DC | PRN

## 2014-04-28 MED ORDER — ACETAMINOPHEN 650 MG RE SUPP: 650.0000 mg | Freq: Four times a day (QID) | RECTAL | Status: DC | PRN

## 2014-04-28 MED ORDER — LIDOCAINE HCL (CARDIAC) 20 MG/ML IV SOLN
INTRAVENOUS | Status: AC
  Filled 2014-04-28: qty 5

## 2014-04-28 MED ORDER — HYDROCODONE-ACETAMINOPHEN 5-325 MG PO TABS: 1.0000 | ORAL_TABLET | Freq: Four times a day (QID) | ORAL | Status: DC | PRN

## 2014-04-28 MED ORDER — PROPOFOL 10 MG/ML IV BOLUS
INTRAVENOUS | Status: AC
  Filled 2014-04-28: qty 20

## 2014-04-28 MED ORDER — SODIUM CHLORIDE 0.9 % IV SOLN
1000.0000 mg | Freq: Once | INTRAVENOUS | Status: AC
  Administered 2014-04-29: 1000 mg via INTRAVENOUS
  Filled 2014-04-28 (×2): qty 20

## 2014-04-28 MED ORDER — PROMETHAZINE HCL 25 MG/ML IJ SOLN: 6.2500 mg | INTRAMUSCULAR | Status: DC | PRN

## 2014-04-28 MED ORDER — SUCCINYLCHOLINE CHLORIDE 20 MG/ML IJ SOLN
INTRAMUSCULAR | Status: DC | PRN
  Administered 2014-04-28: 100 mg via INTRAVENOUS

## 2014-04-28 MED ORDER — MENTHOL 3 MG MT LOZG: 1.0000 | LOZENGE | OROMUCOSAL | Status: DC | PRN

## 2014-04-28 MED ORDER — ACETAMINOPHEN 325 MG PO TABS: 650.0000 mg | ORAL_TABLET | Freq: Four times a day (QID) | ORAL | Status: DC | PRN

## 2014-04-28 MED ORDER — DEXTROSE 5 % IV SOLN
INTRAVENOUS | Status: DC | PRN
  Administered 2014-04-28: 14:00:00 via INTRAVENOUS

## 2014-04-28 MED ORDER — PHENYLEPHRINE 40 MCG/ML (10ML) SYRINGE FOR IV PUSH (FOR BLOOD PRESSURE SUPPORT)
PREFILLED_SYRINGE | INTRAVENOUS | Status: AC
  Filled 2014-04-28: qty 10

## 2014-04-28 MED ORDER — CEFAZOLIN SODIUM 1-5 GM-% IV SOLN
1.0000 g | Freq: Two times a day (BID) | INTRAVENOUS | Status: AC
  Administered 2014-04-29: 1 g via INTRAVENOUS
  Filled 2014-04-28: qty 50

## 2014-04-28 MED ORDER — SODIUM CHLORIDE 0.9 % IV SOLN
INTRAVENOUS | Status: DC | PRN
  Administered 2014-04-28: 14:00:00 via INTRAVENOUS

## 2014-04-28 MED ORDER — METOCLOPRAMIDE HCL 5 MG/ML IJ SOLN: 5.0000 mg | Freq: Three times a day (TID) | INTRAMUSCULAR | Status: DC | PRN

## 2014-04-28 MED ORDER — SODIUM CHLORIDE 0.9 % IV SOLN
25.0000 mg | Freq: Once | INTRAVENOUS | Status: AC
  Administered 2014-04-29: 25 mg via INTRAVENOUS
  Filled 2014-04-28: qty 0.5

## 2014-04-28 MED ORDER — SODIUM CHLORIDE 0.9 % IR SOLN
Status: DC | PRN
  Administered 2014-04-28: 1000 mL

## 2014-04-28 MED ORDER — CEFAZOLIN SODIUM-DEXTROSE 2-3 GM-% IV SOLR
INTRAVENOUS | Status: DC | PRN
  Administered 2014-04-28: 2 g via INTRAVENOUS

## 2014-04-28 SURGICAL SUPPLY — 62 items
BLADE SAW SAG 73X25 THK (BLADE) ×2
BLADE SAW SGTL 73X25 THK (BLADE) ×1 IMPLANT
BLADE SURG ROTATE 9660 (MISCELLANEOUS) IMPLANT
BRUSH FEMORAL CANAL (MISCELLANEOUS) IMPLANT
COVER SURGICAL LIGHT HANDLE (MISCELLANEOUS) ×3 IMPLANT
DRAPE INCISE IOBAN 66X45 STRL (DRAPES) IMPLANT
DRAPE ORTHO SPLIT 77X108 STRL (DRAPES) ×6
DRAPE PROXIMA HALF (DRAPES) ×3 IMPLANT
DRAPE SURG ORHT 6 SPLT 77X108 (DRAPES) ×2 IMPLANT
DRAPE U-SHAPE 47X51 STRL (DRAPES) ×3 IMPLANT
DRILL BIT 7/64X5 (BIT) ×3 IMPLANT
DRSG ADAPTIC 3X8 NADH LF (GAUZE/BANDAGES/DRESSINGS) ×3 IMPLANT
DRSG AQUACEL AG ADV 3.5X10 (GAUZE/BANDAGES/DRESSINGS) ×3 IMPLANT
DRSG PAD ABDOMINAL 8X10 ST (GAUZE/BANDAGES/DRESSINGS) ×6 IMPLANT
DURAPREP 26ML APPLICATOR (WOUND CARE) ×3 IMPLANT
ELECT BLADE 6.5 EXT (BLADE) IMPLANT
ELECT REM PT RETURN 9FT ADLT (ELECTROSURGICAL) ×3
ELECTRODE REM PT RTRN 9FT ADLT (ELECTROSURGICAL) ×1 IMPLANT
EVACUATOR 1/8 PVC DRAIN (DRAIN) IMPLANT
GLOVE BIO SURGEON STRL SZ8 (GLOVE) ×6 IMPLANT
GLOVE BIOGEL PI IND STRL 8 (GLOVE) ×1 IMPLANT
GLOVE BIOGEL PI INDICATOR 8 (GLOVE) ×2
GLOVE SS BIOGEL STRL SZ 8 (GLOVE) ×2 IMPLANT
GLOVE SUPERSENSE BIOGEL SZ 8 (GLOVE) ×4
GOWN STRL REUS W/ TWL LRG LVL3 (GOWN DISPOSABLE) ×1 IMPLANT
GOWN STRL REUS W/ TWL XL LVL3 (GOWN DISPOSABLE) ×3 IMPLANT
GOWN STRL REUS W/TWL 2XL LVL3 (GOWN DISPOSABLE) ×3 IMPLANT
GOWN STRL REUS W/TWL LRG LVL3 (GOWN DISPOSABLE) ×3
GOWN STRL REUS W/TWL XL LVL3 (GOWN DISPOSABLE) ×9
HANDPIECE INTERPULSE COAX TIP (DISPOSABLE)
HIP ENDOPROSTHESIS 47MM (Hips) ×2 IMPLANT
HOOD PEEL AWAY FACE SHEILD DIS (HOOD) ×6 IMPLANT
IMMOBILIZER KNEE 20 (SOFTGOODS) IMPLANT
IMMOBILIZER KNEE 22 UNIV (SOFTGOODS) IMPLANT
IMMOBILIZER KNEE 24 THIGH 36 (MISCELLANEOUS) IMPLANT
IMMOBILIZER KNEE 24 UNIV (MISCELLANEOUS)
KIT BASIN OR (CUSTOM PROCEDURE TRAY) ×3 IMPLANT
KIT ROOM TURNOVER OR (KITS) ×3 IMPLANT
MANIFOLD NEPTUNE II (INSTRUMENTS) ×3 IMPLANT
NDL 1/2 CIR MAYO (NEEDLE) IMPLANT
NDL SUT 2 .5 CRC MAYO 1.732X (NEEDLE) IMPLANT
NEEDLE 1/2 CIR MAYO (NEEDLE) IMPLANT
NEEDLE MAYO TAPER (NEEDLE)
NS IRRIG 1000ML POUR BTL (IV SOLUTION) ×3 IMPLANT
PACK TOTAL JOINT (CUSTOM PROCEDURE TRAY) ×3 IMPLANT
PAD ARMBOARD 7.5X6 YLW CONV (MISCELLANEOUS) ×6 IMPLANT
PASSER SUT SWANSON 36MM LOOP (INSTRUMENTS) IMPLANT
SET HNDPC FAN SPRY TIP SCT (DISPOSABLE) IMPLANT
SPONGE GAUZE 4X4 12PLY (GAUZE/BANDAGES/DRESSINGS) ×3 IMPLANT
STAPLER VISISTAT 35W (STAPLE) ×3 IMPLANT
SUCTION FRAZIER TIP 10 FR DISP (SUCTIONS) ×3 IMPLANT
SUT ETHIBOND NAB CT1 #1 30IN (SUTURE) ×12 IMPLANT
SUT VIC AB 0 CT1 27 (SUTURE) ×3
SUT VIC AB 0 CT1 27XBRD ANBCTR (SUTURE) ×1 IMPLANT
SUT VIC AB 1 CTB1 27 (SUTURE) ×9 IMPLANT
SUT VIC AB 2-0 CT1 27 (SUTURE) ×3
SUT VIC AB 2-0 CT1 TAPERPNT 27 (SUTURE) ×1 IMPLANT
TOWEL OR 17X24 6PK STRL BLUE (TOWEL DISPOSABLE) ×3 IMPLANT
TOWEL OR 17X26 10 PK STRL BLUE (TOWEL DISPOSABLE) ×3 IMPLANT
TOWER CARTRIDGE SMART MIX (DISPOSABLE) IMPLANT
TRAY FOLEY CATH 16FRSI W/METER (SET/KITS/TRAYS/PACK) IMPLANT
WATER STERILE IRR 1000ML POUR (IV SOLUTION) ×12 IMPLANT

## 2014-04-28 NOTE — Interval H&P Note (Signed)
History and Physical Interval Note:  04/28/2014 1:31 PM  [name]  has presented today for surgery, with the diagnosis of RIGHT FEMORAL NECK FRACTURE  The various methods of treatment have been discussed with the patient and family. After consideration of risks, benefits and other options for treatment, the patient has consented to  Procedure(s) with comments: Joes (Right) - NON-CEMENTED as a surgical intervention .  The patient's history has been reviewed, patient examined, no change in status, stable for surgery.  I have reviewed the patient's chart and labs.  Questions were answered to the patient's satisfaction.     Katherine [name]

## 2014-04-28 NOTE — Op Note (Addendum)
PRE-OP DIAGNOSIS:  RIGHT FEMORAL NECK FRACTURE POST-OP DIAGNOSIS:  RIGHT FEMORAL NECK FRACTURE PROCEDURE: Hemiarthroplasty right hip ANESTHESIA:  General SURGEON:  [name] MD ASSISTANT:  Loni Dolly PA-C   INDICATIONS FOR PROCEDURE:  The patient is a 80 y.o. female with a recent history of a painful hip.  Xrays have shown displaced femoral neck fracture.  Hemiarthroplasty is offered as surgical treatment.  Informed operative consent was obtained after discussion with daughter of possible complications including reaction to anesthesia, infection, neurovascular injury, dislocation, DVT, PE, and death.  The importance of the postoperative rehab program to optimize result was stressed with the patient.  SUMMARY OF FINDINGS AND PROCEDURE:  Under general anesthesia through a standard posterior approach a right hemiarthroplasty of the hip was performed.  The patient had no degenerative change and fair bone quality.  We used a [employer] size [place] component to address the hip fracturel.  Loni Dolly PA assisted throughout and was invaluable to the completion of the case in that he helped position and retract while I performed the procedure.  He also closed simultaneously to help minimize OR time.  DESCRIPTION OF PROCEDURE:  The patient was taken to the OR suite where general anesthetic was applied.  The patient was then positioned in the lateral decubitus position with the right hip up.  All bony prominences were appropriately padded, hip positioners were utilized, and an axillary roll was placed.  Prep and drape was then performed in normal sterile fashion.  The patient was given Kefzol preoperative antibiotic and an appropriate time out was performed.  We then took a posterior approach to the right hip.  Dissection was taken through adipose to the IT band and gluteus maximus fascia.  These structures were incised longitudinally to expose the short external rotators of the hip which were tagged  and reflected.  A posterior capsulectomy was performed and the hip was exposed.  A revision femoral neck cut was made above the lesser trochanter and the femoral head was removed.  The acetabulum was exposed and some labral tissues were excised.  The femur was broached with the single broach. The head sized to a 47 and the aforementioned component was placed. Leg lengths were felt to be about equal.  The would was irrigated again followed by re-approximation of short external rotators to the greater trochanteric region.  IT band and gluteus maximum fascia were repaired with #1 vicryl followed by subcutaneous closure with #O and #2 undyed vicryl.  Skin was closed with staples followed by a sterile dressing.  EBL and IOF can be obtained from anesthesia records.  DISPOSITION:  The patient was extubated in the OR and taken to PACU in stable condition to be admitted to the Medicine Service for appropriate post-op care to include perioperative antibiotics and DVT prophylaxis.   We plan to use mechanical treatments for DVT prophylaxis as she likely has GI bleed currently and should not be on blood thinners and she may WBAT.

## 2014-04-28 NOTE — Consult Note (Signed)
[facility] Consult: 10:26 AM 04/28/2014  LOS: 3 days    Referring Provider: Dr Sarajane Jews Primary Care Physician:  [name], MD Primary Gastroenterologist:  Dr. Sharlett Iles then Henrene Pastor then Carlean Purl (both inpt consults and procedures)   Reason for Consultation:  Microcytic anemia   HPI: Katherine [name] is a 80 y.o. female.  PMH significant for HTN, TIA, dementia, CKD, Non-STEMI 06/2012 insetting of anemia. 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 Has required 4 units red cells in 08/2010 (Hgb 5.2), 4 units and parenteral iron in 06/2012  (Hgb of 5.3).   Received parenteral iron in 06/2012 and is supposed to stay on life-long po Iron, however no Iron on outpt med list for current admission.    Admitted 3 days ago with R hip fracture after fall from standing position, progressive confusion.  Surgery delayed until today by transfusion requiring anemia.  Hgb in 06/2012 up to 9.4 after transfusion, no assays since until 04/25/14: 8.1, drop to 6.7 04/26/14, transfused 1 PRBC to 9.3, but down to 7.1 today and two additional units ordered.  Low MCV in 2011 and since 06/2012.    By my rectal exam today stool is brown and FOBT+.  No reports of bleeding PR or melena.  Not able to determine pt's general bowel habits.   She denies pain in belly and nausea. 36# weight loss since 06/2012 but Po intake, appetite reported as "normal".  Walks with cane at baseline according to H&P.   Past Medical History  Diagnosis Date  . Hypertension   . TIA (transient ischemic attack)     Declines aggrenox  . Renal insufficiency     Baseline 1.3  . Depression     Improved with remeron  . Rheumatoid arteritis     Previously on MTX x52yr, failed appropiate f/u  . Breast cancer     Hx  of DCIS 12/99,  completed XRT 12/20/1998- 02/02/1999 tamoxifen 20 mg once daily March 2002 to June 2006,  all mammograms since then have been negative last April 2009.  Marland Kitchen Anemia, macrocytic      most likely secondary to methotrexate use  . Osteoporosis      T score- 3.0 right hip, -2.8 left hip and -2.1 lumbar spine (April 22, 2007). Not on bisphosphonate because of renal insufficiency.  . 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 Dementia   . Diverticulosis of colon 07/21/2009    severe widely distributed tics on colonoscopy  . Headache(784.0)   . Femoral neck fracture 04/27/2014    04/2014 from fall    Past Surgical History  Procedure Laterality Date  . Appendectomy    . Cholecystectomy    . Lung biopsies.  2011    VATS bx:  benign granuloma.   . Esophagogastroduodenoscopy  07/15/2012    Procedure: ESOPHAGOGASTRODUODENOSCOPY (EGD);  Surgeon: Gatha Mayer, MD;  Location: [facility] ENDOSCOPY;  Service: Endoscopy;  Laterality: N/A;    Prior to Admission medications  Medication Sig Start Date End Date Taking? Authorizing Provider  donepezil (ARICEPT) 5 MG tablet Take 5 mg by mouth at bedtime.   Yes Historical Provider, MD  hydrochlorothiazide (HYDRODIURIL) 12.5 MG tablet Take 12.5 mg by mouth daily. 03/05/14  Yes Historical Provider, MD  QUEtiapine (SEROQUEL) 50 MG tablet Take 50 mg by mouth at bedtime. 03/05/14  Yes Historical Provider, MD  risperiDONE (RISPERDAL) 0.5 MG tablet Take 0.5 mg by mouth 2 (two) times daily. 03/05/14  Yes Historical Provider, MD    Scheduled Meds: . donepezil  5 mg Oral QHS  . feeding supplement (ENSURE COMPLETE)  237 mL Oral BID BM  . polyethylene glycol  17 g Oral Daily  . QUEtiapine  50 mg Oral QHS  . risperiDONE  0.5 mg Oral BID   Infusions: . sodium chloride 75 mL/hr (04/27/14 1649)   PRN Meds: bisacodyl, HYDROcodone-acetaminophen, morphine injection   Allergies as of 04/25/2014 - Review Complete 04/25/2014  Allergen Reaction Noted  .  Simvastatin      Family History  Problem Relation Age of Onset  . Cancer Sister     Died  . Diabetes Brother   . Coronary artery disease Mother     died age 63  . Diabetes Daughter     died age 56    History   Social History  . Marital Status: Widowed    Spouse Name: N/A    Number of Children: N/A  . Years of Education: N/A   Occupational History  . Not on file.   Social History Main Topics  . Smoking status: Former Research scientist (life sciences)  . Smokeless tobacco: Current User    Types: Snuff     Comment: large bag/pack lasts 2 days.  . Alcohol Use: No  . Drug Use: Yes    Special: Psilocybin  . Sexual Activity: Not on file   Other Topics Concern  . Not on file   Social History Narrative   Red bag given 03/14/2009.   Recently moved in with her daughter, she states because of problems with her neighbors and because her son moved out.   Has 2 daughters but 1 is deceased from DM at age 63.          REVIEW OF SYSTEMS: Constitutional:  Unable to obtain much info on pt status PTA.   ENT:  No nose bleeds reported Pulm:  No dyspnea reported CV:  No palpitations, no LE edema.  GU:  No hematuria, no frequency.  Apparently oliguric at home and inpt.  + incontinence GI:  No hx dysphagia, no vomiting, no belly pain Heme:  Per HPI   Transfusions:  Per HPI Neuro:  No headaches, no peripheral tingling or numbness Derm:  No itching, no rash or sores.  Endocrine:  No sweats or chills.  No polyuria or dysuria Immunization:  Not known Travel:  None beyond local counties in last few months.    PHYSICAL EXAM: Vital signs in last 24 hours: Filed Vitals:   04/28/14 0954  BP: 134/80  Pulse: 90  Temp: 98.2 F (36.8 C)  Resp: 20   Wt Readings from Last 3 Encounters:  04/25/14 44.453 kg (98 lb)  04/25/14 44.453 kg (98 lb)  07/12/12 60.8 kg (134 lb 0.6 oz)   General: Cachectic, contracture appearing aged AAF Head:  No asymetry or swelling.  +temporal wasting.  Coin diameter soft mass with  central eschar on left forehead, looks like sebaceous cyst.  Eyes:  No icterus, + pallor Ears:  No obvious hearing loss  Nose:  No discharge Mouth:  No blood per os or lesions Neck:  No mass or JVD Lungs/chest:  Clear but diminished.  No dyspnea.  S/p right mastectomy Heart: harsh systolic murmer, regular rate and rhythm. Abdomen:  Soft, NT, ND.  No mass or HSM.  Marland Kitchen   Rectal: soft, formed brown stool in rectal vault it is 1 to 2+ FOBT positive.    Musc/Skeltl: bruising on left hip Extremities:  No pedal edema  Neurologic: follows commands and appropriate responses to simple questions.  No tremor.  Moves all 4 limbs, limited activity of left leg Skin:  No sores or rash Tattoos:  none Nodes:  No cervical adenopathy.    Psych:  Relaxed, cooperative  Intake/Output from previous day: 06/10 0701 - 06/11 0700 In: 1500 [P.O.:600; I.V.:900] Out: 1 [Urine:1] Intake/Output this shift: Total I/O In: 12.5 [Blood:12.5] Out: -   LAB RESULTS:  Recent Labs  04/25/14 1640 04/26/14 0525 04/26/14 1320 04/28/14 0550  WBC 10.3 7.0  --  6.4  HGB 8.1* 6.7* 9.3* 7.1*  HCT 27.7* 22.4* 30.7* 24.4*  PLT 208 163  --  155  MCV on admission, pre-transfusion: 73.   BMET Lab Results  Component Value Date   NA 147 04/28/2014   NA 152* 04/27/2014   NA 152* 04/26/2014   K 4.5 04/28/2014   K 4.9 04/27/2014   K 3.4* 04/26/2014   CL 114* 04/28/2014   CL 119* 04/27/2014   CL 117* 04/26/2014   CO2 20 04/28/2014   CO2 21 04/27/2014   CO2 19 04/26/2014   GLUCOSE 105* 04/28/2014   GLUCOSE 164* 04/27/2014   GLUCOSE 129* 04/26/2014   BUN 33* 04/28/2014   BUN 40* 04/27/2014   BUN 47* 04/26/2014   CREATININE 0.93 04/28/2014   CREATININE 0.93 04/27/2014   CREATININE 1.10 04/26/2014   CALCIUM 9.1 04/28/2014   CALCIUM 9.6 04/27/2014   CALCIUM 9.3 04/26/2014   LFT  Recent Labs  04/25/14 1640  PROT 8.1  ALBUMIN 3.0*  AST 36  ALT 18  ALKPHOS 87  BILITOT 1.1   PT/INR Lab Results  Component Value Date   INR 1.17 04/25/2014    INR 1.02 07/12/2012   INR 1.00 09/09/2010   Hepatitis Panel No results found for this basename: HEPBSAG, HCVAB, HEPAIGM, HEPBIGM,  in the last 72 hours C-Diff No components found with this basename: cdiff   Lipase     Component Value Date/Time   LIPASE 42 04/25/2014 1640    Drugs of Abuse     Component Value Date/Time   LABOPIA NONE DETECTED 11/22/2007 1900   COCAINSCRNUR NONE DETECTED 11/22/2007 1900   LABBENZ NONE DETECTED 11/22/2007 1900   AMPHETMU NONE DETECTED 11/22/2007 1900   THCU NONE DETECTED 11/22/2007 1900   LABBARB  Value: NONE DETECTED        DRUG SCREEN FOR MEDICAL PURPOSES ONLY.  IF CONFIRMATION IS NEEDED FOR ANY PURPOSE, NOTIFY LAB WITHIN 5 DAYS. 11/22/2007 1900     RADIOLOGY STUDIES: No results found.  ENDOSCOPIC STUDIES: 06/2012  EGD  Dr Carlean Purl  For anemia/FOBT + ENDOSCOPIC IMPRESSION:  1. Bleeding angiodysplastic lesion in the gastric antrum - GAVE -  oozing - ablated and treated with APC  2. The remainder of the upper endoscopy exam was otherwise normal  RECOMMENDATIONS:  1. continue PPI single dose daily for 1 month  2. daily chronic iron therapy DO NOT STOP   09/10/2010 EGD Dr Henrene Pastor for Iron def  anemia, FOB +  1) Benign Stricture in the distal esophagus  2) GAVE (gastric antral vascular ectasia) in the antrum. This  is the cause for recurrent iron deficiency anemia and heme + stool  3) Normal duodenum  RECOMMENDATIONS:  1) TRANSFUSE TO DESIRED HG  2) FESO4 325MG  BID INDEFINITELY  3) PCP TO MONITOR H/H CLOSELY AS OUT PATIENT. MAY NEED PERIODIC  TRANSFUSION IF UNABLE TO MAINTAIN HG ON IRON.   07/21/2009 Colonoscopy    Verl Blalock, MD for anemia  1) Severe diverticulosis throughout the colon  2) Otherwise normal examination    IMPRESSION:   *  Recurrent microcytic anemia.  FOBT + but no gross bleeding. ABL due to hip fracture likely playing significant role Was supposed to be on life long iron but po iron is not on home med list.   *  Hx GAVE,  last EGD in 06/2012 with APC ablation of bleeding AVM.   *  Constipation by exam.   *  Right femoral neck fracture. Ortho planning hemiarthroplasty 6/11, today.   *  Advanced dementia.  Limited ability to communicate.   *  Hx right breast cancer.  Radiation/mastectomy 2000  *  Malnutrition.     PLAN:     *  Parenteral iron, consult to pharmacy.  Give test and therapeutic dose starting tomorrow 6/12 *  Hip arthroplasty set for this afternoon *  Possible EGD, timing per Dr Deatra Ina but this is not urgent and could be done in 24 to 48 hours.  *  Give rectal suppository/Miralax after hip surgery.     Azucena Freed  04/28/2014, 10:26 AM Pager: [phone]  GI Attending Note   Chart was reviewed and patient was examined. X-rays and lab were reviewed.  Heme positive stool may very well be related to chronic bleeding from GAVE.  She's at increased risk for stress ulceration.  She is not overtly bleeding.    Recommend 1.  Empiric PPI Rx 2.  Hold EGD unless she develops overt bleeding 3.  Permanent Fe supplementation 4.  T/c elective EGD if she cannot maintain blood counts with supplemental Fe (allowing bleeding expected from hip fracture).  Sandy Salaam. Deatra Ina, M.D., [facility] Cell [phone]

## 2014-04-28 NOTE — Progress Notes (Signed)
DR Daldorf's nurse informed of patient H/H nad receiving 1 unit PRBC this am.- to give messge to MD.

## 2014-04-28 NOTE — Progress Notes (Signed)
Report called to short stay receiving RN prior to transport to OR. To get post-transfusion CBC in short stay.

## 2014-04-28 NOTE — H&P (View-Only) (Signed)
Katherine Nakayama, MD           Loni Dolly, PA-C  [name] [address], [place], [place]  [id number]   ORTHOPAEDIC CONSULTATION  [name]            MRN:  [id number] DOB/SEX:  11-17-1923/female    REQUESTING PHYSICIAN:    CHIEF COMPLAINT:  Painful right hip  HISTORY: Katherine [name] a 80 y.o. female with AMS who presents with a right femoral neck fracture after a fall. Patient is alone in her room at this time. She is awake but has sever dementia. She does not respond appropriately to any questions. Records and history are taken from chart review as she is not able to communicate.    PAST MEDICAL HISTORY: Patient Active Problem List   Diagnosis Date Noted  . Fracture of femoral neck, right 04/25/2014  . Malnutrition 04/25/2014  . Hypernatremia 04/25/2014  . Iron deficiency anemia secondary to blood loss (chronic) 07/13/2012  . Gastric antral vascular ectasia (watermelon stomach) 07/13/2012  . NSTEMI (non-ST elevated myocardial infarction) 07/12/2012  . Dementia 02/05/2012  . Plantar wart of right foot 02/05/2012  . Benign skin lesion of forehead 02/05/2012  . AV BLOCK, 1ST DEGREE 06/22/2009  . HYPERLIPIDEMIA 02/07/2009  . VITAMIN B12 DEFICIENCY 10/06/2008  . TIA 12/03/2007  . Pain in joint, shoulder region 12/03/2007  . RENAL INSUFFICIENCY 08/25/2007  . KNEE PAIN, RIGHT, CHRONIC 08/05/2007  . OSTEOPOROSIS 05/08/2007  . SYMPTOM, MEMORY LOSS 04/08/2007  . BREAST MASS, LEFT 03/25/2007  . ANEMIA, FOLATE-DEFICIENCY 11/05/2006  . DISORDER, DYSTHYMIC 09/26/2006  . HYPERTENSION 09/26/2006  . RHEUMATOID ARTHRITIS 09/26/2006   Past Medical History  Diagnosis Date  . Hypertension   . TIA (transient ischemic attack)     Declines aggrenox  . Renal insufficiency     Baseline 1.3  . Depression     Improved with remeron  . Rheumatoid arteritis     Previously on MTX x38yr, failed appropiate f/u  . Breast cancer     Hx of DCIS 12/99,   completed XRT 12/20/1998- 02/02/1999 tamoxifen 20 mg once daily March 2002 to June 2006,  all mammograms since then have been negative last April 2009.  Marland Kitchen Anemia, macrocytic      most likely secondary to methotrexate use  . Osteoporosis      T score- 3.0 right hip, -2.8 left hip and -2.1 lumbar spine (April 22, 2007). Not on bisphosphonate because of renal insufficiency.  . 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 Dementia   . Diverticulosis of colon 07/21/2009    severe widely distributed tics on colonoscopy   Past Surgical History  Procedure Laterality Date  . Appendectomy    . Cholecystectomy    . Lung biopsies.  2011    VATS bx:  benign granuloma.   . Esophagogastroduodenoscopy  07/15/2012    Procedure: ESOPHAGOGASTRODUODENOSCOPY (EGD);  Surgeon: Gatha Mayer, MD;  Location: [facility] ENDOSCOPY;  Service: Endoscopy;  Laterality: N/A;     MEDICATIONS:  Current facility-administered medications:0.9 %  sodium chloride infusion, , Intravenous, Continuous, Fredia Sorrow, MD;  0.9 %  sodium chloride infusion, , Intravenous, STAT, Fredia Sorrow, MD;  aspirin EC tablet 325 mg, 325 mg, Oral, Daily, Jared M Gardner, DO;  donepezil (ARICEPT) tablet 5 mg, 5 mg, Oral, QHS, Jared M Gardner, DO HYDROcodone-acetaminophen (NORCO/VICODIN) 5-325 MG per tablet 1-2 tablet, 1-2 tablet, Oral, Q6H PRN, Etta Quill, DO;  morphine 2 MG/ML injection 0.5 mg, 0.5 mg, Intravenous, Q2H PRN, Etta Quill, DO;  QUEtiapine (SEROQUEL) tablet 50 mg, 50 mg, Oral, QHS, Jared M Gardner, DO;  risperiDONE (RISPERDAL) tablet 0.5 mg, 0.5 mg, Oral, BID, Etta Quill, DO  ALLERGIES:   Allergies  Allergen Reactions  . Simvastatin     REACTION: nightmares    REVIEW OF SYSTEMS: REVIEWED IN DETAIL IN CHART  FAMILY HISTORY:   Family History  Problem Relation Age of Onset  . Cancer Sister     Died  . Diabetes Brother   . Coronary artery disease Mother     died age 50  . Diabetes Daughter     died age 47     SOCIAL HISTORY:   History  Substance Use Topics  . Smoking status: Former Research scientist (life sciences)  . Smokeless tobacco: Current User    Types: Snuff     Comment: large bag/pack lasts 2 days.  . Alcohol Use: No     EXAMINATION: Vital signs in last 24 hours: Temp:  [98 F (36.7 C)-99.4 F (37.4 C)] 98 F (36.7 C) (06/08 2039) Pulse Rate:  [90-108] 105 (06/08 2039) Resp:  [16-24] 20 (06/08 2039) BP: (95-140)/(47-84) 139/70 mmHg (06/08 2039) SpO2:  [95 %-100 %] 99 % (06/08 2039) Weight:  [44.453 kg (98 lb)] 44.453 kg (98 lb) (06/08 1619)  General appearance: no distress and with Altared mental status. Head: Normocephalic, without obvious abnormality, atraumatic Lungs: unlabored respiration Extremities: extremities normal, atraumatic, no cyanosis or edema and no edema, redness or tenderness in the calves or thighs Pulses: 2+ and symmetric Skin: Skin color, texture, turgor normal. No rashes or lesions  Musculoskeletal Exam: Patient laying in bed on left hip with both knees flexed up.2 + DP pulses bilaterally and patient wiggles toes on command. She does not grimace with palpation to the right hip. Exam limited due to dimentia     DIAGNOSTIC STUDIES: Recent laboratory studies:  Recent Labs  04/25/14 1640  WBC 10.3  HGB 8.1*  HCT 27.7*  PLT 208    Recent Labs  04/25/14 1640  NA 153*  K 3.5*  CL 116*  CO2 20  BUN 45*  CREATININE 1.22*  GLUCOSE 130*  CALCIUM 10.1   Lab Results  Component Value Date   INR 1.17 04/25/2014   INR 1.02 07/12/2012   INR 1.00 09/09/2010     Recent Radiographic Studies :  Dg Chest 1 View  04/25/2014   CLINICAL DATA:  Altered mental status.  Fall.  Dementia.  EXAM: CHEST - 1 VIEW  COMPARISON:  Multiple prior chest radiographs dating back to 09/06/2010.  FINDINGS: Patient rotated to the left. Stable cardiomegaly. Atherosclerotic calcification of the thoracic aortic arch. Chronic mild elevation of the left hemidiaphragm with chronic blunting of the left  costophrenic angle. Chain suture noted with in the left lung. Right lung is hyperinflated and clear. Cholecystectomy clips noted. Diffuse osteopenia.  IMPRESSION: Postsurgical changes of the left lung with chronic volume loss, elevation left hemidiaphragm, and chronic blunting of the left costophrenic angle. No acute findings in the chest.  Stable cardiomegaly.   Electronically Signed   By: Curlene Dolphin M.D.   On: 04/25/2014 17:39   Dg Hip Bilateral W/pelvis  04/25/2014   CLINICAL DATA:  Fall.  Hip pain.  Dementia.  EXAM: BILATERAL HIP WITH PELVIS - 4+ VIEW  COMPARISON:  None.  FINDINGS: The bones are diffusely osteopenic. There is an acute right femoral neck fracture with varus angulation.  Slight proximal displacement of the distal fracture fragment. The right hip is located.  The left hip is located and proximal left femur appears intact.  Pelvic ring appears intact.  No fracture or diastasis is identified.  IMPRESSION: Acute right femoral neck fracture.   Electronically Signed   By: Curlene Dolphin M.D.   On: 04/25/2014 17:43   Ct Head Wo Contrast  04/25/2014   CLINICAL DATA:  80 year old female with altered mental status fall and hip pain. Initial encounter.  EXAM: CT HEAD WITHOUT CONTRAST  CT CERVICAL SPINE WITHOUT CONTRAST  TECHNIQUE: Multidetector CT imaging of the head and cervical spine was performed following the standard protocol without intravenous contrast. Multiplanar CT image reconstructions of the cervical spine were also generated.  COMPARISON:  Head CT 11/22/2007.  Brain MRI 11/23/2007.  FINDINGS: CT HEAD FINDINGS  Study is intermittently degraded by motion artifact despite repeated imaging attempts.  Focal left forehead scalp hematoma measures up to 8 mm in thickness. Underlying left frontal bone is intact. No other scalp hematoma identified. Grossly stable and clear paranasal sinuses and mastoids. Grossly negative orbits soft tissues.  Calcified atherosclerosis at the skull base. Cerebral  volume is not significantly changed since 2009. No ventriculomegaly. No midline shift, mass effect, or evidence of intracranial mass lesion. No acute intracranial hemorrhage identified. No evidence of cortically based acute infarction identified.  CT CERVICAL SPINE FINDINGS  Study is moderately to severely degraded by motion artifact despite repeated imaging attempts.  Reformatted images are not very useful on this study related to the motion. Axial images suggest no definite acute cervical spine fracture. Grossly intact visualized upper thoracic levels. Negative lung apices except for scarring and emphysema. Grossly negative paraspinal soft tissues.  IMPRESSION: 1. No definite acute intracranial abnormality and stable noncontrast CT appearance of the brain since 2009 allowing for motion artifact. 2. Motion degraded cervical spine CT, with no definite acute cervical spine fracture. If neck pain persists, a repeat cervical spine CT is recommended when the patient can better cooperate.   Electronically Signed   By: Lars Pinks M.D.   On: 04/25/2014 17:24   Ct Cervical Spine Wo Contrast  04/25/2014   CLINICAL DATA:  80 year old female with altered mental status fall and hip pain. Initial encounter.  EXAM: CT HEAD WITHOUT CONTRAST  CT CERVICAL SPINE WITHOUT CONTRAST  TECHNIQUE: Multidetector CT imaging of the head and cervical spine was performed following the standard protocol without intravenous contrast. Multiplanar CT image reconstructions of the cervical spine were also generated.  COMPARISON:  Head CT 11/22/2007.  Brain MRI 11/23/2007.  FINDINGS: CT HEAD FINDINGS  Study is intermittently degraded by motion artifact despite repeated imaging attempts.  Focal left forehead scalp hematoma measures up to 8 mm in thickness. Underlying left frontal bone is intact. No other scalp hematoma identified. Grossly stable and clear paranasal sinuses and mastoids. Grossly negative orbits soft tissues.  Calcified atherosclerosis  at the skull base. Cerebral volume is not significantly changed since 2009. No ventriculomegaly. No midline shift, mass effect, or evidence of intracranial mass lesion. No acute intracranial hemorrhage identified. No evidence of cortically based acute infarction identified.  CT CERVICAL SPINE FINDINGS  Study is moderately to severely degraded by motion artifact despite repeated imaging attempts.  Reformatted images are not very useful on this study related to the motion. Axial images suggest no definite acute cervical spine fracture. Grossly intact visualized upper thoracic levels. Negative lung apices except for scarring and emphysema. Grossly negative paraspinal soft tissues.  IMPRESSION: 1. No definite acute intracranial abnormality and stable noncontrast CT appearance of the brain since 2009 allowing for motion artifact. 2. Motion degraded cervical spine CT, with no definite acute cervical spine fracture. If neck pain persists, a repeat cervical spine CT is recommended when the patient can better cooperate.   Electronically Signed   By: Lars Pinks M.D.   On: 04/25/2014 17:24    ASSESSMENT: Right femoral neck fracture    PLAN: When stable from a medical standpoint patient will have a right hip hemi arthroplasty. Hopefully we will be able to preform this tomorrow. We will discuss this with the patients daughter tomorrow morning.   Larwance Sachs Ama Mcmaster 04/25/2014, 11:04 PM

## 2014-04-28 NOTE — Progress Notes (Signed)
PROGRESS NOTE  Katherine [name] YCX:[id number] DOB: [birth year] DOA: 04/25/2014 PCP: [name], MD  Summary: 78yow presented to ED with h/o confusion above basdeline, h/o fall 1 day prior to presentation, non-ambulatory since. Workup revealed right femoral neck fracture. CT head and cervical spine did not show acute intracranial findings. CXR showed postsurgical changes of the left lung with chronic volume loss, elevation left hemidiaphragm, and chronic blunting of the left costophrenic angle however no acute findings in the chest. UA did not show leukocytes or nitrites.   Assessment/Plan: 1. Right femoral neck fracture. Operative intervention per orthopedics. 2. Encephalopathy, suspect subacute, complicated by fracture. Appears to be at baseline. No evidence of infection. 3. Microcytic anemia. 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 However, suspect hemodilution as review of records suggest chronic microcytic anemia. No evidence of ongoing bleeding. Transfuse in preparation for surgery. Favor bleeding at fracture site rather than reactivation GI bleed. Will ask GI opinion.  4. Dementia, depression on Aricept, Seroquel and Risperdal. Appears stable, alert and cooperative. 5. Hypernatremia, dehydration. Resolved. Likely secondary to poor oral intake. 6. H/o RA 7. H/o NSTEMI 06/2012 in setting of significant anemia Hgb 5.3, medical management recommened with no plans for interventional investigation. Appears to be asymptomatic with no signs or symptoms of ACS. Follow conservatively. 8. Severe malnutrition in the context of chronic illness. History of breast cancer. Followup as an outpatient. 9. Underweight   Management of fracture per orthopedics. On SCDs for prophylaxis given anemia requiring transfusion. May need to hold surgery today.  Transfuse 2 units packed red blood cells in preparation for surgery anticipated soon. Stool  Hemoccult. Follow CBC closely. GI consult. Unfortunately no anemia panel ordered prior to initial transfusion on admission.  Bowel regimen  Code Status: full code DVT prophylaxis: SCDs (anemia) Family Communication: none present Disposition Plan: likely SNF  Murray Hodgkins, MD  [facility]  Pager [phone] If 7PM-7AM, please contact night-coverage at [email or web address], password [facility] 04/28/2014, 10:02 AM  LOS: 3 days   Consultants:  Orthopedics   Procedures:  Transfusion PRBC 6/9  Antibiotics:    HPI/Subjective: No issues overnight per RN. Patient with advanced dementia, history and reliable.  Objective: Filed Vitals:   04/27/14 1300 04/27/14 2018 04/28/14 0618 04/28/14 0924  BP: 98/61 99/62 99/60  115/67  Pulse: 93 95 92 88  Temp:  98.7 F (37.1 C) 98.6 F (37 C) 98.7 F (37.1 C)  TempSrc:    Oral  Resp: 18 18 18 18   Weight:      SpO2: 96% 99% 99% 100%    Intake/Output Summary (Last 24 hours) at 04/28/14 1002 Last data filed at 04/28/14 0939  Gross per 24 hour  Intake 1272.5 ml  Output      1 ml  Net 1271.5 ml     Filed Weights   04/25/14 1619  Weight: 44.453 kg (98 lb)    Exam:   Afebrile, VSS, no hypoxia Gen. Appears calm and comfortable. Nontoxic. Psych. Remains confused but follows simple commands, easily redirectable. Cardiovascular. Regular rate and rhythm. No murmur, rub or gallop. No lower extremity edema. Respiratory. Clear to auscultation bilaterally. No wheezes, rales or rhonchi. Normal respiratory effort. Abdomen. Soft, nontender, nondistended. Skin. Grossly unremarkable Musculoskeletal. Moves all extremities.  Data Reviewed:  Basic metabolic panel shows resolution of hyponatremia.  Hemoglobin 7.1  Scheduled Meds: . aspirin EC  325 mg Oral Daily  . donepezil  5 mg Oral QHS  . feeding supplement (  ENSURE COMPLETE)  237 mL Oral BID BM  . QUEtiapine  50 mg Oral QHS  . risperiDONE  0.5 mg Oral BID   Continuous Infusions: .  sodium chloride 75 mL/hr (04/27/14 1649)    Principal Problem:   Fracture of femoral neck, right Active Problems:   Iron deficiency anemia secondary to blood loss (chronic)   Gastric antral vascular ectasia (watermelon stomach)   Malnutrition   Hypernatremia   Dehydration   Severe malnutrition   Time spent 20 minutes

## 2014-04-28 NOTE — Progress Notes (Addendum)
MEDICATION RELATED CONSULT NOTE - INITIAL   Pharmacy Consult:  Iron dextran Indication:  Anemia  Allergies  Allergen Reactions  . Simvastatin     REACTION: nightmares    Patient Measurements: Height: 5' 6.14" (168 cm) Weight: 98 lb (44.453 kg) IBW/kg (Calculated) : 59.63  Vital Signs: Temp: 98.2 F (36.8 C) (06/11 0954) Temp src: Oral (06/11 0954) BP: 134/80 mmHg (06/11 0954) Pulse Rate: 90 (06/11 0954) Intake/Output from previous day: 06/10 0701 - 06/11 0700 In: 1500 [P.O.:600; I.V.:900] Out: 1 [Urine:1] Intake/Output from this shift: Total I/O In: 12.5 [Blood:12.5] Out: -   Labs:  Recent Labs  04/25/14 1640 04/26/14 0525 04/26/14 1320 04/27/14 1145 04/28/14 0550  WBC 10.3 7.0  --   --  6.4  HGB 8.1* 6.7* 9.3*  --  7.1*  HCT 27.7* 22.4* 30.7*  --  24.4*  PLT 208 163  --   --  155  CREATININE 1.22* 1.10  --  0.93 0.93  ALBUMIN 3.0*  --   --   --   --   PROT 8.1  --   --   --   --   AST 36  --   --   --   --   ALT 18  --   --   --   --   ALKPHOS 87  --   --   --   --   BILITOT 1.1  --   --   --   --    The CrCl is unknown because both a height and weight (above a minimum accepted value) are required for this calculation.   Microbiology: Recent Results (from the past 720 hour(s))  URINE CULTURE     Status: None   Collection Time    04/25/14  4:27 PM      Result Value Ref Range Status   Specimen Description URINE, CATHETERIZED   Final   Special Requests ADDED [id number]   Final   Culture  Setup Time     Final   Value: 04/25/2014 21:56     Performed at [facility]     Final   Value: NO GROWTH     Performed at [facility]   Culture     Final   Value: NO GROWTH     Performed at [facility]   Report Status 04/27/2014 FINAL   Final  SURGICAL PCR SCREEN     Status: None   Collection Time    04/26/14  5:57 AM      Result Value Ref Range Status   MRSA, PCR NEGATIVE  NEGATIVE Final   Staphylococcus aureus  NEGATIVE  NEGATIVE Final   Comment:            The Xpert SA Assay (FDA     approved for NASAL specimens     in patients over 60 years of age),     is one component of     a comprehensive surveillance     program.  Test performance has     been validated by [employer] for patients greater     than or equal to 23 year old.     It is not intended     to diagnose infection nor to     guide or monitor treatment.    Medical History: Past Medical History  Diagnosis Date  . Hypertension   . TIA (transient ischemic attack)  Declines aggrenox  . Renal insufficiency     Baseline 1.3  . Depression     Improved with remeron  . Rheumatoid arteritis     Previously on MTX x49yr, failed appropiate f/u  . 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  Marland Kitchen Anemia, macrocytic      most likely secondary to methotrexate use  . Osteoporosis      T score- 3.0 right hip, -2.8 left hip and -2.1 lumbar spine (April 22, 2007). Not on bisphosphonate because of renal insufficiency.  . 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 Dementia   . Diverticulosis of colon 07/21/2009    severe widely distributed tics on colonoscopy  . Headache(784.0)   . Femoral neck fracture 04/27/2014    04/2014 from fall  . Breast cancer, right 2000    mastectomy and radiation. Intraductal  . Rheumatoid arthritis 2007  . Malnutrition 04/2014      Assessment: 80 YOF with history of gastric antral vascular ectasia and microcytic anemia.  She received IV iron in August of 2013 and will require maintenance PO iron per MD.  Noted patient is FOB+ and is currently receiving transfusion (hemoglobin low at 7.1 g/dL).  No new anemia panel in EPIC since 2013.  Pharmacy consulted to dose iron dextran, starting tomorrow.  Her calculated iron deficit is 975 mg.   Goal of Therapy:   Hgb ~ 11 g/dL   Plan:  - On 04/29/14, give iron dextran test dose of 25mg  IV x 1 as ordered - If patient tolerates test dose, will give iron dextran 1gm IV x 1 - F/U with order to start PO iron     [name] D. Mina Marble, PharmD, BCPS Pager:  [phone] 04/28/2014, 12:02 PM

## 2014-04-28 NOTE — Anesthesia Preprocedure Evaluation (Addendum)
Anesthesia Evaluation  Patient identified by MRN, date of birth, ID band Patient awake    Reviewed: Allergy & Precautions, H&P , NPO status , Patient's Chart, lab work & pertinent test results, reviewed documented beta blocker date and time   Airway Mallampati: II TM Distance: >3 FB Neck ROM: Limited    Dental no notable dental hx. (+) Edentulous Upper, Edentulous Lower   Pulmonary neg pulmonary ROS, former smoker,  breath sounds clear to auscultation  Pulmonary exam normal       Cardiovascular hypertension, Pt. on medications Rhythm:Regular Rate:Normal     Neuro/Psych dementia TIAnegative psych ROS   GI/Hepatic negative GI ROS, Neg liver ROS,   Endo/Other  negative endocrine ROS  Renal/GU negative Renal ROS  negative genitourinary   Musculoskeletal  (+) Arthritis -, Rheumatoid disorders,    Abdominal   Peds negative pediatric ROS (+)  Hematology negative hematology ROS (+)   Anesthesia Other Findings Spoke with daughter  Reproductive/Obstetrics negative OB ROS                        Anesthesia Physical Anesthesia Plan  ASA: III  Anesthesia Plan: General   Post-op Pain Management:    Induction: Intravenous  Airway Management Planned: Oral ETT  Additional Equipment:   Intra-op Plan:   Post-operative Plan: Extubation in OR  Informed Consent: I have reviewed the patients History and Physical, chart, labs and discussed the procedure including the risks, benefits and alternatives for the proposed anesthesia with the patient or authorized representative who has indicated his/her understanding and acceptance.   Dental advisory given  Plan Discussed with: CRNA and Surgeon  Anesthesia Plan Comments:         Anesthesia Quick Evaluation

## 2014-04-28 NOTE — Anesthesia Postprocedure Evaluation (Signed)
  Anesthesia Post-op Note  Patient: Katherine [name]  Procedure(s) Performed: Procedure(s) with comments: ARTHROPLASTY BIPOLAR HIP (Right) - NON-CEMENTED  Patient Location: PACU  Anesthesia Type:General  Level of Consciousness: awake, alert  and oriented  Airway and Oxygen Therapy: Patient Spontanous Breathing  Post-op Pain: mild  Post-op Assessment: Post-op Vital signs reviewed  Post-op Vital Signs: Reviewed  Last Vitals:  Filed Vitals:   04/28/14 1517  BP:   Pulse:   Temp: 36.7 C  Resp:     Complications: No apparent anesthesia complications

## 2014-04-28 NOTE — Transfer of Care (Signed)
Immediate Anesthesia Transfer of Care Note  Patient: Katherine [name]  Procedure(s) Performed: Procedure(s) with comments: ARTHROPLASTY BIPOLAR HIP (Right) - NON-CEMENTED  Patient Location: PACU  Anesthesia Type:General  Level of Consciousness: awake, sedated and patient cooperative  Airway & Oxygen Therapy: Patient Spontanous Breathing, Patient connected to nasal cannula oxygen and Patient connected to face mask oxygen  Post-op Assessment: Report given to PACU RN, Post -op Vital signs reviewed and stable and Patient moving all extremities  Post vital signs: Reviewed and stable  Complications: No apparent anesthesia complications

## 2014-04-28 NOTE — Anesthesia Procedure Notes (Signed)
Procedure Name: Intubation Date/Time: 04/28/2014 1:59 PM Performed by: Williemae Area B Pre-anesthesia Checklist: Patient identified, Emergency Drugs available, Suction available and Patient being monitored Patient Re-evaluated:Patient Re-evaluated prior to inductionOxygen Delivery Method: Circle system utilized Preoxygenation: Pre-oxygenation with 100% oxygen Intubation Type: IV induction Ventilation: Oral airway inserted - appropriate to patient size and Mask ventilation without difficulty Laryngoscope Size: Miller and 2 Grade View: Grade II Tube type: Oral Tube size: 7.0 mm Number of attempts: 1 (EMT student) Airway Equipment and Method: Stylet Placement Confirmation: ETT inserted through vocal cords under direct vision,  breath sounds checked- equal and bilateral and positive ETCO2 Secured at: 21 (cm at gum) cm Tube secured with: Tape Dental Injury: Teeth and Oropharynx as per pre-operative assessment

## 2014-04-29 LAB — CBC
HCT: 29.8 % — ABNORMAL LOW (ref 36.0–46.0)
Hemoglobin: 9.4 g/dL — ABNORMAL LOW (ref 12.0–15.0)
MCH: 24.8 pg — ABNORMAL LOW (ref 26.0–34.0)
MCHC: 31.5 g/dL (ref 30.0–36.0)
MCV: 78.6 fL (ref 78.0–100.0)
PLATELETS: 135 10*3/uL — AB (ref 150–400)
RBC: 3.79 MIL/uL — AB (ref 3.87–5.11)
RDW: 17.9 % — AB (ref 11.5–15.5)
WBC: 7.2 10*3/uL (ref 4.0–10.5)

## 2014-04-29 LAB — BASIC METABOLIC PANEL
BUN: 24 mg/dL — ABNORMAL HIGH (ref 6–23)
CO2: 21 meq/L (ref 19–32)
CREATININE: 0.85 mg/dL (ref 0.50–1.10)
Calcium: 8.9 mg/dL (ref 8.4–10.5)
Chloride: 111 mEq/L (ref 96–112)
GFR calc non Af Amer: 59 mL/min — ABNORMAL LOW (ref >90)
GFR, EST AFRICAN AMERICAN: 68 mL/min — AB (ref >90)
Glucose, Bld: 121 mg/dL — ABNORMAL HIGH (ref 70–99)
POTASSIUM: 4.7 meq/L (ref 3.7–5.3)
SODIUM: 142 meq/L (ref 137–147)

## 2014-04-29 MED ORDER — FERROUS SULFATE 325 (65 FE) MG PO TABS
325.0000 mg | ORAL_TABLET | ORAL | Status: DC
  Administered 2014-04-29: 325 mg via ORAL
  Filled 2014-04-29 (×2): qty 1

## 2014-04-29 NOTE — Evaluation (Signed)
Occupational Therapy Evaluation and Discharge Patient Details Name: Katherine [name] MRN: [id number] DOB: 03/04/1923 Today's Date: 04/29/2014    History of Present Illness 80 y.o. female s/p right hip hemiarthroplasty. Signficant history of dementia, HTN, TIA, and anemia.   Clinical Impression   This 80 yo female admitted and underwent above presents to acute OT with decreased mobility, increased pain, dementia all affecting her ability to help with her care. All education completed, we will D/C from acute OT.    Follow Up Recommendations  No OT follow up    Equipment Recommendations  None recommended by OT       Precautions / Restrictions Precautions Precautions: Posterior Hip Restrictions Weight Bearing Restrictions: No RLE Weight Bearing: Weight bearing as tolerated      Mobility Bed Mobility Overal bed mobility: +2 for physical assistance;Needs Assistance Bed Mobility: Sit to Supine       Sit to supine: +2 for physical assistance;Max assist      Transfers Overall transfer level: Needs assistance   Transfers: Squat Pivot Transfers (to pt's left)     Squat pivot transfers: +2 physical assistance;Total assist (using gait belt and pad under her)          Balance Overall balance assessment: Needs assistance Sitting-balance support: Feet supported;Bilateral upper extremity supported Sitting balance-Leahy Scale: Zero                                      ADL                                         General ADL Comments: total A pta, but was ambulatory. Spoke to daugter over the phone to educate her on posterior hip precautions and how to safe move (roll) pt in bed to protect her hip. Handout left in room with additional written instructions added.               Pertinent Vitals/Pain PAINAD=4; repositioned     Hand Dominance  (unknown)   Extremity/Trunk Assessment Upper Extremity Assessment Upper Extremity Assessment:  Generalized weakness           Communication Communication Communication: Receptive difficulties;Expressive difficulties   Cognition Arousal/Alertness: Awake/alert Behavior During Therapy: Flat affect Overall Cognitive Status:  (h/o cognitive deficits, not sure if different, no family available)                                Home Living Family/patient expects to be discharged to:: Private residence Living Arrangements: Children;Other relatives Available Help at Discharge: Family;Available 24 hours/day                                    Prior Functioning/Environment Level of Independence: Needs assistance  Gait / Transfers Assistance Needed: ambulated around the house with a SPC ADL's / Homemaking Assistance Needed: daughter did all BADLs, pt word Depends                 OT Goals(Current goals can be found in the care plan section) Acute Rehab OT Goals Patient Stated Goal: Daughter wants to take her home  OT Frequency:  End of Session Equipment Utilized During Treatment: Gait belt Nurse Communication:  (needs a new KI and bandage on right hip due to both soaked with urine)  Activity Tolerance: Patient limited by pain (limited by dementia) Patient left: in bed;with call bell/phone within reach;with bed alarm set;with nursing/sitter in room   Time: 1424-1459 OT Time Calculation (min): 35 min Charges:  OT General Charges $OT Visit: 1 Procedure OT Evaluation $Initial OT Evaluation Tier I: 1 Procedure OT Treatments $Therapeutic Activity: 23-37 mins  Almon Register [phone] 04/29/2014, 3:14 PM

## 2014-04-29 NOTE — Progress Notes (Signed)
Orthopedic Tech Progress Note Patient Details:  Katherine [name] Dec 15, 1922 [id number] Applied; replacement Ortho Devices Type of Ortho Device: Knee Immobilizer Ortho Device/Splint Location: RLE Ortho Device/Splint Interventions: Application   [name] 04/29/2014, 3:33 PM

## 2014-04-29 NOTE — Progress Notes (Signed)
CSW referral received to assist with SNF placement for this patient.  Please see Amy Stuckey's progress note of 04/27/14. Patient's daughter plans to take patient home with home health and does not wish SNF placement.  Will notify RNCM of above.  CSW will remain signed off but available if needed.  Many thanks!  Lorie Phenix. [place], [place]  (coverage)

## 2014-04-29 NOTE — Evaluation (Signed)
Physical Therapy Evaluation Patient Details Name: Katherine [name] MRN: [id number] DOB: 11-21-1922 Today's Date: 04/29/2014   History of Present Illness  80 y.o. female s/p right hip hemiarthroplasty. Signficant history of dementia, HTN, TIA, and anemia.  Clinical Impression  Patient is seen following the above procedure and presents with functional limitations due to the deficits listed below (see PT Problem List). Pt was unable to participate in interview questions and no caregiver was available to discuss d/c planning. From previous notes it appears that she lives with her daughter who states she has all neccessary equipment to adequately care for pt at home. Patient will benefit from skilled PT to increase their independence and safety with mobility to allow discharge home with daughter.      Follow Up Recommendations Supervision/Assistance - 24 hour    Equipment Recommendations  None recommended by PT    Recommendations for Other Services OT consult     Precautions / Restrictions Precautions Precautions: Posterior Hip Precaution Booklet Issued: Yes (comment) Precaution Comments: reviewed Restrictions Weight Bearing Restrictions: Yes RLE Weight Bearing: Weight bearing as tolerated      Mobility  Bed Mobility Overal bed mobility: Needs Assistance;+2 for physical assistance Bed Mobility: Supine to Sit     Supine to sit: Max assist;+2 for physical assistance     General bed mobility comments: Very little response to instructions. Able to move LEs minimally with Max assist for LE support and second person for trunk control from supine>sit position  Transfers Overall transfer level: Needs assistance Equipment used: None Transfers: Squat Pivot Transfers     Squat pivot transfers: +2 safety/equipment;Max assist     General transfer comment: Max assit squat pivot transfer. Pt pushes posteriorly and appears very fearful. Performed towards patients left side. Very little  to no assist from pt. Was able to place hand on chair briefly but graps PT as soon as she is moved.  Ambulation/Gait                Stairs            Wheelchair Mobility    Modified Rankin (Stroke Patients Only)       Balance Overall balance assessment: Needs assistance;History of Falls Sitting-balance support: Bilateral upper extremity supported;Feet supported Sitting balance-Leahy Scale: Zero   Postural control: Posterior lean;Left lateral lean                                   Pertinent Vitals/Pain Pt unable to report level of pain Patient repositioned in chair for comfort.     Home Living Family/patient expects to be discharged to:: Private residence   Available Help at Discharge: Family;Available 24 hours/day             Additional Comments: All information obtained through recent notes upon this hospital admission.    Prior Function Level of Independence: Needs assistance         Comments: Unknown. Pt unable to communicate effectively     Hand Dominance        Extremity/Trunk Assessment   Upper Extremity Assessment: Defer to OT evaluation           Lower Extremity Assessment: Difficult to assess due to impaired cognition         Communication   Communication: Receptive difficulties;Expressive difficulties  Cognition Arousal/Alertness: Awake/alert Behavior During Therapy: Flat affect Overall Cognitive Status: No family/caregiver present to determine baseline cognitive functioning  General Comments General comments (skin integrity, edema, etc.): Pt with tenderness to palpation of LLE over left quadriceps.    Exercises        Assessment/Plan    PT Assessment Patient needs continued PT services  PT Diagnosis Generalized weakness;Acute pain;Altered mental status;Difficulty walking   PT Problem List Decreased strength;Decreased range of motion;Decreased activity  tolerance;Decreased balance;Decreased mobility;Decreased coordination;Decreased cognition;Decreased knowledge of use of DME;Decreased safety awareness;Decreased knowledge of precautions;Cardiopulmonary status limiting activity;Pain  PT Treatment Interventions DME instruction;Gait training;Functional mobility training;Therapeutic activities;Therapeutic exercise;Balance training;Neuromuscular re-education;Cognitive remediation;Patient/family education;Wheelchair mobility training;Modalities   PT Goals (Current goals can be found in the Care Plan section) Acute Rehab PT Goals Patient Stated Goal: None stated PT Goal Formulation: Patient unable to participate in goal setting Time For Goal Achievement: 05/06/14 Potential to Achieve Goals: Fair    Frequency Min 5X/week   Barriers to discharge        Co-evaluation               End of Session Equipment Utilized During Treatment: Gait belt;Right knee immobilizer Activity Tolerance: Treatment limited secondary to medical complications (Comment) Patient left: in chair;with call bell/phone within reach Nurse Communication: Mobility status         Time: 6812-7517 PT Time Calculation (min): 28 min   Charges:   PT Evaluation $Initial PT Evaluation Tier I: 1 Procedure PT Treatments $Therapeutic Activity: 8-22 mins   PT G Codes:         Elayne Snare, [place]  Ellouise Newer 04/29/2014, 12:58 PM

## 2014-04-29 NOTE — Progress Notes (Signed)
Subjective: 1 Day Post-Op Procedure(s) (LRB): ARTHROPLASTY BIPOLAR HIP (Right)  Activity level:  wbat Diet tolerance:  ok Voiding:  ok Patient reports pain as patient is nonverbal due to dementia but does not appear in any acute distress.    Objective: Vital signs in last 24 hours: Temp:  [97.4 F (36.3 C)-98.7 F (37.1 C)] 98.1 F (36.7 C) (06/12 0540) Pulse Rate:  [70-96] 91 (06/12 0540) Resp:  [15-29] 22 (06/12 0540) BP: (104-177)/(43-87) 118/61 mmHg (06/12 0540) SpO2:  [95 %-100 %] 98 % (06/12 0540)  Labs:  Recent Labs  04/26/14 1320 04/28/14 0550 04/28/14 1951 04/29/14 0615  HGB 9.3* 7.1* 11.1* 9.4*    Recent Labs  04/28/14 1951 04/29/14 0615  WBC 8.6 7.2  RBC 4.43 3.79*  HCT 35.2* 29.8*  PLT 136* 135*    Recent Labs  04/28/14 0550 04/29/14 0615  NA 147 142  K 4.5 4.7  CL 114* 111  CO2 20 21  BUN 33* 24*  CREATININE 0.93 0.85  GLUCOSE 105* 121*  CALCIUM 9.1 8.9   No results found for this basename: LABPT, INR,  in the last 72 hours  Physical Exam:  Neurologically intact ABD soft Neurovascular intact Sensation intact distally Intact pulses distally Dorsiflexion/Plantar flexion intact Incision: dressing C/D/I No cellulitis present Compartment soft  Assessment/Plan:  1 Day Post-Op Procedure(s) (LRB): ARTHROPLASTY BIPOLAR HIP (Right) Advance diet Up with therapy Discharge home with home health daughter states that she is equipped to take care of patient at home. Medical team will decide when to discharge patient. Only TED hose and SCD for DVT prophylaxis as patient likely has active GI Bleed. Follow up in office 2 weeks post op from surgery.  Continue WBAT. Continue current pain regimen      Tracee Mccreery, Larwance Sachs 04/29/2014, 7:53 AM

## 2014-04-29 NOTE — Care Management Note (Signed)
CARE MANAGEMENT NOTE 04/29/2014  Patient:  Katherine [name], Katherine [name]   Account Number:  [id number]  Date Initiated:  04/26/2014  Documentation initiated by:  Ricki Miller  Subjective/Objective Assessment:   80 yr old female s/p right femur fracture with right hemiarthroplasty.       Anticipated DC Date:  04/30/2014   Anticipated DC Plan:  [place]  In-house referral  Clinical Social Worker      DC Planning Services  CM consult      PAC Choice  [place]   Choice offered to / List presented to:  C-4 Adult Children   DME arranged  [place]      DME agency  [place] arranged  [place]   Status of service:  In process, will continue to follow Medicare Important Message given?   (If response is "NO", the following Medicare IM given date fields will be blank) Date Medicare IM given:   Date Additional Medicare IM given:    Discharge Disposition:  [place]  Per UR Regulation:    If discussed at Long Length of Stay Meetings, dates discussed:    Comments:  04/29/14 Ricki Miller, RN BSN Case Manager Case manager spoke with Colin Broach, patient's daughter. Her phone number has changedL [phone]. Choice offered to daughter for home health. Daughter requested we get a hospital bed and hoyer lift. states she has been physically lifting her mom in and out of bed. Plans to continue caring for her at home.

## 2014-04-29 NOTE — Progress Notes (Signed)
Daily Rounding Note  04/29/2014, 8:29 AM  LOS: 4 days   SUBJECTIVE:       One day post right hip hemiarthroplasty. Brown BM yesterday, sent to lab: FOBT negative.  Tolerated test dose of Infed, therapeutic infusion planned for today.   OBJECTIVE:         Vital signs in last 24 hours:    Temp:  [97.4 F (36.3 C)-98.7 F (37.1 C)] 98.1 F (36.7 C) (06/12 0540) Pulse Rate:  [70-96] 91 (06/12 0540) Resp:  [15-29] 16 (06/12 0800) BP: (104-177)/(43-87) 118/61 mmHg (06/12 0540) SpO2:  [95 %-100 %] 98 % (06/12 0540) Last BM Date: 04/28/14 General: alert, comfortable.   Heart: RRR with heart murmer Chest: clear bil.  No labored breathing or cough Abdomen: soft, thin, NT, ND, active BS  Extremities: IV line in left lower leg.  No pedal edema.  right hip bandaged, pressure ulcer dressing on left hip.  Neuro/Psych:  Pleasant, follows commands, limited speech is difficult to understand.  Not oriented to place or date. Not delirious.   Intake/Output from previous day: 06/11 0701 - 06/12 0700 In: 3296.3 [I.V.:2583.8; Blood:712.5] Out: 150 [Blood:150]  Intake/Output this shift:    Lab Results:  Recent Labs  04/28/14 0550 04/28/14 1951 04/29/14 0615  WBC 6.4 8.6 7.2  HGB 7.1* 11.1* 9.4*  HCT 24.4* 35.2* 29.8*  PLT 155 136* 135*   BMET  Recent Labs  04/27/14 1145 04/28/14 0550 04/29/14 0615  NA 152* 147 142  K 4.9 4.5 4.7  CL 119* 114* 111  CO2 21 20 21   GLUCOSE 164* 105* 121*  BUN 40* 33* 24*  CREATININE 0.93 0.93 0.85  CALCIUM 9.6 9.1 8.9    Studies/Results: No results found.  Scheduled Meds: . donepezil  5 mg Oral QHS  . feeding supplement (ENSURE COMPLETE)  237 mL Oral BID BM  . iron dextran (INFED/DEXFERRUM) infusion  1,000 mg Intravenous Once  . polyethylene glycol  17 g Oral Daily  . QUEtiapine  50 mg Oral QHS  . risperiDONE  0.5 mg Oral BID   Continuous Infusions: . sodium chloride 75 mL/hr  at 04/29/14 0232   PRN Meds:.acetaminophen, acetaminophen, bisacodyl, HYDROcodone-acetaminophen, menthol-cetylpyridinium, metoCLOPramide (REGLAN) injection, metoCLOPramide, morphine injection, ondansetron (ZOFRAN) IV, ondansetron, phenol   ASSESMENT:   * Recurrent microcytic anemia. FOBT + but no gross bleeding.  ABL due to hip fracture likely playing significant role  Was supposed to be on life long iron but po iron is not on home med list.  * Hx GAVE, last EGD in 06/2012 with APC ablation of bleeding AVM.  * Constipation. On scheduled Miralax.  * Right femoral neck fracture. S/P hemiarthroplasty 6/11. No post op DVT prophy meds on board: just TED and SCD recommended per Ortho. .  * Advanced dementia.  * Hx right breast cancer. Radiation/mastectomy 2000  * Malnutrition. BMI 15. Ensure added.  *  Thrombocytopenia, not critical.  *  Renal insufficiency, BUN imporved, suspect dehydration.     PLAN   *  Infusions of Iron Dextran per pharmacy orders.  *  EGD only if overt bleeding or if with IV and po Iron, she remains significantly anemic (Hgb < 9.5 in coming months).  Adding po Iron 3 x weekly.  *  Follow CBC regularly. *  Will sign off.     Katherine [name]  04/29/2014, 8:29 AM  GI Attending Note  I have personally taken an interval history, reviewed  the chart, and examined the patient.  I agree with the extender's note, impression and recommendations.  Katherine [name]. Katherine Ina, MD, [facility] [phone]  Pager: [phone]

## 2014-04-29 NOTE — Progress Notes (Signed)
PROGRESS NOTE  Katherine [name] JSE:[id number] DOB: 1923-08-01 DOA: 04/25/2014 PCP: [name], MD  Summary: 54yow presented to ED with h/o confusion above basdeline, h/o fall 1 day prior to presentation, non-ambulatory since. Workup revealed right femoral neck fracture. CT head and cervical spine did not show acute intracranial findings. CXR showed postsurgical changes of the left lung with chronic volume loss, elevation left hemidiaphragm, and chronic blunting of the left costophrenic angle however no acute findings in the chest. UA did not show leukocytes or nitrites.   Assessment/Plan: 1. Right femoral neck fracture. Operative intervention 6/11. 2. Encephalopathy, suspect subacute, complicated by fracture. Appears to be at baseline. No evidence of infection. 3. Microcytic anemia. 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 Suspect acute component of bleeding at the fracture site superimposed on chronic blood loss. GI consultation appreciated: Patient given parenteral iron. Monitor clinically. 4. Dementia, depression on Aricept, Seroquel and Risperdal. Appears stable, alert and cooperative. 5. Hypernatremia, dehydration. Resolved. Likely secondary to poor oral intake. 6. H/o RA 7. H/o NSTEMI 06/2012 in setting of significant anemia Hgb 5.3, medical management recommened with no plans for interventional investigation. Appears to be asymptomatic with no signs or symptoms of ACS. Follow conservatively. 8. Severe malnutrition in the context of chronic illness. History of breast cancer. Followup as an outpatient. 9. Underweight   Management of fracture per orthopedics. Only TED hose and SCD for DVT prophylaxis as patient likely has chronic GI Bleed. Follow up in office 2 weeks post op from surgery. Continue WBAT  Continue bowel regimen  Permanent iron supplementation. Consider EGD if unable to maintain blood counts with supplement iron.  Empiric PPI.  Anticipate discharge on 6/13 if hemoglobin stable.  Code Status: full code DVT prophylaxis: SCDs (anemia) Family Communication: none present Disposition Plan: likely SNF  Murray Hodgkins, MD  [facility]  Pager [phone] If 7PM-7AM, please contact night-coverage at [email or web address], password [facility] 04/29/2014, 10:14 AM  LOS: 4 days   Consultants:  Orthopedics   Gastroenterology  Procedures:  Transfusion 2 units PRBC 6/9  6/11 Hemiarthroplasty right hip  Antibiotics:    HPI/Subjective: S/p surgery yesterday without event. Received 2 units packed red blood cells.  Patient pleasantly confused  Objective: Filed Vitals:   04/29/14 0036 04/29/14 0400 04/29/14 0540 04/29/14 0800  BP: 107/50  118/61   Pulse: 79  91   Temp: 97.6 F (36.4 C)  98.1 F (36.7 C)   TempSrc: Axillary  Oral   Resp: 22 20 22 16   Height:      Weight:      SpO2: 100% 100% 98%     Intake/Output Summary (Last 24 hours) at 04/29/14 1014 Last data filed at 04/29/14 0600  Gross per 24 hour  Intake 3283.75 ml  Output    150 ml  Net 3133.75 ml     Filed Weights   04/25/14 1619  Weight: 44.453 kg (98 lb)    Exam:   Afebrile, VSS, no hypoxia Gen. Appears calm and comfortable.  Psych. Pleasantly confused. Cardiovascular. Regular rate and rhythm. No murmur, rub or gallop. No lower extremity edema. Respiratory. Clear to auscultation bilaterally. No wheezes, rales or rhonchi. Normal respiratory effort. Abdomen. Soft, nontender, nondistended Musculoskeletal. Grossly normal tone and strength. Neurologic. Grossly nonfocal.  Data Reviewed:  Basic metabolic panel with resolution of hypernatremia and dehydration.  Hemoglobin somewhat lower, 9.4.  Scheduled Meds: . donepezil  5 mg Oral QHS  . feeding supplement (ENSURE COMPLETE)  237 mL Oral BID BM  . ferrous sulfate  325 mg Oral Q M,W,F  . iron dextran (INFED/DEXFERRUM) infusion  1,000 mg Intravenous Once  .  polyethylene glycol  17 g Oral Daily  . QUEtiapine  50 mg Oral QHS  . risperiDONE  0.5 mg Oral BID   Continuous Infusions: . sodium chloride 75 mL/hr at 04/29/14 8675    Principal Problem:   Fracture of femoral neck, right Active Problems:   Iron deficiency anemia secondary to blood loss (chronic)   Gastric antral vascular ectasia (watermelon stomach)   Malnutrition   Hypernatremia   Dehydration   Severe malnutrition   Time spent 20 minutes

## 2014-04-29 NOTE — Progress Notes (Signed)
Call received from [facility] that patient is anticipated to be d/c'd this weekend and may require EMS transport. Will ask weekend CSW to follow up and assist with this if indicated.  Lorie Phenix. [place], [place]

## 2014-04-30 LAB — TYPE AND SCREEN
ABO/RH(D): O POS
Antibody Screen: NEGATIVE
Unit division: 0
Unit division: 0
Unit division: 0
Unit division: 0

## 2014-04-30 LAB — CBC
HEMATOCRIT: 26.2 % — AB (ref 36.0–46.0)
Hemoglobin: 8.1 g/dL — ABNORMAL LOW (ref 12.0–15.0)
MCH: 24.5 pg — ABNORMAL LOW (ref 26.0–34.0)
MCHC: 30.9 g/dL (ref 30.0–36.0)
MCV: 79.4 fL (ref 78.0–100.0)
Platelets: 146 10*3/uL — ABNORMAL LOW (ref 150–400)
RBC: 3.3 MIL/uL — AB (ref 3.87–5.11)
RDW: 19 % — AB (ref 11.5–15.5)
WBC: 8.4 10*3/uL (ref 4.0–10.5)

## 2014-04-30 MED ORDER — POLYETHYLENE GLYCOL 3350 17 G PO PACK
17.0000 g | PACK | Freq: Two times a day (BID) | ORAL | Status: DC
  Administered 2014-04-30 – 2014-05-01 (×2): 17 g via ORAL
  Filled 2014-04-30 (×3): qty 1

## 2014-04-30 NOTE — Care Management Note (Signed)
    Page 1 of 2   04/30/2014     1:37:37 PM CARE MANAGEMENT NOTE 04/30/2014  Patient:  Katherine [name], Katherine [name]   Account Number:  [id number]  Date Initiated:  04/26/2014  Documentation initiated by:  Ricki Miller  Subjective/Objective Assessment:   80 yr old female s/p right femur fracture with right hemiarthroplasty.     Action/Plan:   Patient is for shortterm rehab at [facility]. Social worker is aware.   Anticipated DC Date:  04/30/2014   Anticipated DC Plan:  [place]  In-house referral  Clinical Social Worker      DC Planning Services  CM consult      PAC Choice  [place]   Choice offered to / List presented to:  C-4 Adult Children   DME arranged  [place]      DME agency  [address] arranged  [place]   Status of service:  Completed, signed off Medicare Important Message given?   (If response is "NO", the following Medicare IM given date fields will be blank) Date Medicare IM given:   Date Additional Medicare IM given:    Discharge Disposition:  [place]  Per UR Regulation:    If discussed at Long Length of Stay Meetings, dates discussed:    Comments:  04-30-14  [place], RN,BSN [phone] CM did call [facility] with DME to give him correct number to daughter for DME delivery. No further needs from CM at this time.   04/29/14 Ricki Miller, RN BSN Case Manager Case manager spoke with Colin Broach, patient's daughter. Her phone number has changedL [phone]. Choice offered to daughter for home health. Daughter requested we get a hospital bed and hoyer lift. states she has been physically lifting her mom in and out of bed. Plans to continue caring for her at home.      04/28/14 11:56AM Ricki Miller, RN BSN Case manager attempting to reach daughter, Colin Broach [phone]

## 2014-04-30 NOTE — Progress Notes (Signed)
Physical Therapy Treatment Patient Details Name: LUDIA GARTLAND MRN: [id number] DOB: [birth year] Today's Date: 04/30/2014    History of Present Illness 80 y.o. female s/p right hip hemiarthroplasty. Signficant history of dementia, HTN, TIA, and anemia.    PT Comments    Pt with little progress from previous physical therapy session. Able to respond to commands better today but still max-total assist for mobility, with exception to improved sitting balance today. Patient will continue to benefit from skilled physical therapy services to further improve independence with functional mobility.   Follow Up Recommendations  Supervision/Assistance - 24 hour     Equipment Recommendations  None recommended by PT    Recommendations for Other Services OT consult     Precautions / Restrictions Precautions Precautions: Posterior Hip Restrictions Weight Bearing Restrictions: Yes RLE Weight Bearing: Weight bearing as tolerated    Mobility  Bed Mobility Overal bed mobility: Needs Assistance;+2 for physical assistance Bed Mobility: Supine to Sit     Supine to sit: Max assist;+2 for physical assistance     General bed mobility comments: Slightly improved response to instruction. Pt able to hold PT hand for support when performing supine>sit.  Able to move LEs minimally with Max assist for LE support and second person for trunk control from supine>sit position  Transfers Overall transfer level: Needs assistance Equipment used: None Transfers: Sit to/from Stand Sit to Stand: Total assist;+2 physical assistance   Squat pivot transfers: +2 safety/equipment;Max assist     General transfer comment: Max assit squat pivot transfer. Pt pushes posteriorly and appears very fearful. Performed towards patients left side. Very little to no assist from pt. Grabs PT as soon as she is moved for transfer. Attempted to stand x2 from elevated bed surface with +2 assist. Unsuccessful however pt did have  the ability to lean forward with VCs and tactile cues for brief period.  Ambulation/Gait                 Stairs            Wheelchair Mobility    Modified Rankin (Stroke Patients Only)       Balance                                    Cognition Arousal/Alertness: Awake/alert Behavior During Therapy: Flat affect Overall Cognitive Status: No family/caregiver present to determine baseline cognitive functioning                      Exercises General Exercises - Lower Extremity Ankle Circles/Pumps: PROM;AAROM;Both;10 reps;Seated    General Comments        Pertinent Vitals/Pain No numerical pain value given. Pt moans when RLE is moved. Patient repositioned in chair for comfort.     Home Living                      Prior Function            PT Goals (current goals can now be found in the care plan section) Acute Rehab PT Goals PT Goal Formulation: Patient unable to participate in goal setting Time For Goal Achievement: 05/06/14 Potential to Achieve Goals: Fair Progress towards PT goals: Progressing toward goals    Frequency  Min 5X/week    PT Plan Current plan remains appropriate    Co-evaluation  End of Session Equipment Utilized During Treatment: Gait belt;Right knee immobilizer Activity Tolerance: Treatment limited secondary to medical complications (Comment) Patient left: in chair;with call bell/phone within reach     Time: 5993-5701 PT Time Calculation (min): 16 min  Charges:  $Therapeutic Activity: 8-22 mins                    G Codes:      Elayne Snare, [place]  Ellouise Newer 04/30/2014, 3:03 PM

## 2014-04-30 NOTE — Progress Notes (Signed)
PROGRESS NOTE  REBACCA VOTAW OIT:[id number] DOB: [birth year] DOA: 04/25/2014 PCP: [name], MD  Summary: 73yow presented to ED with h/o confusion above basdeline, h/o fall 1 day prior to presentation, non-ambulatory since. Workup revealed right femoral neck fracture. CT head and cervical spine did not show acute intracranial findings. CXR showed postsurgical changes of the left lung with chronic volume loss, elevation left hemidiaphragm, and chronic blunting of the left costophrenic angle however no acute findings in the chest. UA did not show leukocytes or nitrites.   Assessment/Plan: 1. Right femoral neck fracture. Status post operative intervention 6/11. 2. Encephalopathy, suspect subacute, complicated by fracture. Appears to be at baseline. No evidence of infection. 3. Microcytic anemia, combination acute blood loss anemia superimposed on chronic blood loss. Somewhat lower today, likely reflective of local abrasion postoperatively. Likely multifactorial. 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 Suspect acute component of bleeding at the fracture site superimposed on chronic blood loss. GI consultation appreciated. Patient given parenteral iron.  4. Dementia, depression on Aricept, Seroquel and Risperdal. Stable, alert and cooperative. 5. Hypernatremia, dehydration. Resolved. Likely secondary to poor oral intake. 6. H/o RA 7. H/o NSTEMI 06/2012 in setting of significant anemia Hgb 5.3, medical management recommened with no plans for interventional investigation. Stable. 8. Severe malnutrition in the context of chronic illness. History of breast cancer. Followup as an outpatient. 9. Underweight   Management of fracture per orthopedics. Only TED hose and SCD for DVT prophylaxis as patient likely has chronic GI Bleed. Follow up in office 2 weeks post op from surgery. Continue WBAT, posterior hip precautions.  Continue bowel regimen  Permanent iron  supplementation. Consider EGD if unable to maintain blood counts with supplement iron. Empiric PPI.  Anticipate discharge on 6/14 if hemoglobin stable. Discussed with daughter by telephone.  Code Status: full code DVT prophylaxis: SCDs (anemia) Family Communication: none present Disposition Plan: likely SNF  Murray Hodgkins, MD  [facility]  Pager [phone] If 7PM-7AM, please contact night-coverage at [email or web address], password [facility] 04/30/2014, 10:41 AM  LOS: 5 days   Consultants:  Orthopedics   Gastroenterology  Procedures:  Transfusion 2 units PRBC 6/9  6/11 Hemiarthroplasty right hip  Antibiotics:    HPI/Subjective: No issues overnight. History is unreliable secondary to dementia  Objective: Filed Vitals:   04/29/14 2115 04/30/14 0000 04/30/14 0400 04/30/14 0643  BP:    117/67  Pulse: 79   83  Temp:    98.5 F (36.9 C)  TempSrc:    Oral  Resp:  18 20 18   Height:      Weight:      SpO2:  100% 99% 100%    Intake/Output Summary (Last 24 hours) at 04/30/14 1041 Last data filed at 04/30/14 0645  Gross per 24 hour  Intake   2375 ml  Output      0 ml  Net   2375 ml     Filed Weights   04/25/14 1619  Weight: 44.453 kg (98 lb)    Exam:   Afebrile, VSS, no hypoxia Gen. Appears calm and comfortable. Speech fluent and clear. Psych. Pleasantly confused. Calm. Cardiovascular. Regular rate and rhythm. No murmur, rub or gallop. No lower extremity edema. Respiratory. Clear to auscultation bilaterally. No wheezes, rales or rhonchi. Normal respiratory effort. Abdomen. Soft Musculoskeletal. Moves all extremities. Neurologic. Grossly nonfocal.  Data Reviewed:  Basic metabolic panel with resolution of hypernatremia and dehydration.  Hemoglobin somewhat lower, 8.1 with modest, cytopenia.  Scheduled  Meds: . donepezil  5 mg Oral QHS  . feeding supplement (ENSURE COMPLETE)  237 mL Oral BID BM  . ferrous sulfate  325 mg Oral Q M,W,F  . polyethylene glycol   17 g Oral Daily  . QUEtiapine  50 mg Oral QHS  . risperiDONE  0.5 mg Oral BID   Continuous Infusions: . sodium chloride 75 mL/hr at 04/29/14 1953    Principal Problem:   Fracture of femoral neck, right Active Problems:   Iron deficiency anemia secondary to blood loss (chronic)   Gastric antral vascular ectasia (watermelon stomach)   Malnutrition   Hypernatremia   Dehydration   Severe malnutrition   Time spent 20 minutes

## 2014-04-30 NOTE — Progress Notes (Signed)
Subjective: 2 Days Post-Op Procedure(s) (LRB): ARTHROPLASTY BIPOLAR HIP (Right) Patient reports pain as 2 on 0-10 scale.  Some confusion this a.m.  I believe this is her usual mental status.  Objective: Vital signs in last 24 hours: Temp:  [98.5 F (36.9 C)-99.3 F (37.4 C)] 98.5 F (36.9 C) (06/13 0643) Pulse Rate:  [48-95] 83 (06/13 0643) Resp:  [16-20] 18 (06/13 0643) BP: (103-117)/(52-67) 117/67 mmHg (06/13 0643) SpO2:  [99 %-100 %] 100 % (06/13 0643)  Intake/Output from previous day: 06/12 0701 - 06/13 0700 In: 2545 [P.O.:720; I.V.:1275; IV Piggyback:550] Out: -  Intake/Output this shift:     Recent Labs  04/28/14 0550 04/28/14 1951 04/29/14 0615 04/30/14 0415  HGB 7.1* 11.1* 9.4* 8.1*    Recent Labs  04/29/14 0615 04/30/14 0415  WBC 7.2 8.4  RBC 3.79* 3.30*  HCT 29.8* 26.2*  PLT 135* 146*    Recent Labs  04/28/14 0550 04/29/14 0615  NA 147 142  K 4.5 4.7  CL 114* 111  CO2 20 21  BUN 33* 24*  CREATININE 0.93 0.85  GLUCOSE 105* 121*  CALCIUM 9.1 8.9   No results found for this basename: LABPT, INR,  in the last 72 hours Right hip exam: Neurovascular intact Sensation intact distally Intact pulses distally Dorsiflexion/Plantar flexion intact Incision: dressing C/D/I Compartment soft  Assessment/Plan: 2 Days Post-Op Procedure(s) (LRB): ARTHROPLASTY BIPOLAR HIP (Right) Acute blood loss anemia Plan: Management of anemia per medicine service. SCDs for DVT prophylaxis. Weight-bear as tolerated on right.  Posterior hip precautions. Will follow.  He can go to SNF when medically stable. We will follow until then.   Bandy Honaker G 04/30/2014, 9:38 AM

## 2014-05-01 LAB — CBC
HCT: 30.3 % — ABNORMAL LOW (ref 36.0–46.0)
HEMOGLOBIN: 9.3 g/dL — AB (ref 12.0–15.0)
MCH: 24.4 pg — ABNORMAL LOW (ref 26.0–34.0)
MCHC: 30.7 g/dL (ref 30.0–36.0)
MCV: 79.5 fL (ref 78.0–100.0)
Platelets: 184 10*3/uL (ref 150–400)
RBC: 3.81 MIL/uL — AB (ref 3.87–5.11)
RDW: 20.2 % — ABNORMAL HIGH (ref 11.5–15.5)
WBC: 8.1 10*3/uL (ref 4.0–10.5)

## 2014-05-01 MED ORDER — BISACODYL 10 MG RE SUPP: 10.0000 mg | Freq: Every day | RECTAL | Status: DC | PRN

## 2014-05-01 MED ORDER — POLYETHYLENE GLYCOL 3350 17 G PO PACK: 17.0000 g | PACK | Freq: Every day | ORAL | Status: DC | PRN

## 2014-05-01 MED ORDER — FERROUS SULFATE 325 (65 FE) MG PO TABS: 325.0000 mg | ORAL_TABLET | ORAL | Status: AC

## 2014-05-01 NOTE — Progress Notes (Signed)
Patient is medically stable for D/C home today. Per RN patient needs EMS for transport. CSW confirmed with patient's daughter Lattie Haw the address and arranged EMS home. Nursing is aware of above. Please reconsult if further social work needs arise. CSW signing off.   Blima Rich, [place] Weekend CSW [phone]

## 2014-05-01 NOTE — Progress Notes (Addendum)
Subjective: 3 Days Post-Op Procedure(s) (LRB): ARTHROPLASTY BIPOLAR HIP (Right) Patient reports pain as 2 on 0-10 scale.  Complains of minimal hip pain. Some confusion today, but much better than yesterday. Patient appears comfortable. Knee immobilizer is intact to the right lower extremity with SCDs.  Objective: Vital signs in last 24 hours: Temp:  [98.1 F (36.7 C)-98.3 F (36.8 C)] 98.1 F (36.7 C) (06/14 0532) Pulse Rate:  [68-100] 68 (06/14 0532) Resp:  [16-18] 16 (06/14 0532) BP: (114-133)/(49-73) 123/49 mmHg (06/14 0532) SpO2:  [90 %-100 %] 98 % (06/14 0532)  Intake/Output from previous day: 06/13 0701 - 06/14 0700 In: 1080 [P.O.:1080] Out: -  Intake/Output this shift:     Recent Labs  04/28/14 1951 04/29/14 0615 04/30/14 0415 05/01/14 0552  HGB 11.1* 9.4* 8.1* 9.3*    Recent Labs  04/30/14 0415 05/01/14 0552  WBC 8.4 8.1  RBC 3.30* 3.81*  HCT 26.2* 30.3*  PLT 146* 184    Recent Labs  04/29/14 0615  NA 142  K 4.7  CL 111  CO2 21  BUN 24*  CREATININE 0.85  GLUCOSE 121*  CALCIUM 8.9   Right hip exam: Neurovascular intact Sensation intact distally Intact pulses distally Dorsiflexion/Plantar flexion intact Incision: dressing C/D/I No cellulitis present Compartment soft  Assessment/Plan: 3 Days Post-Op Procedure(s) (LRB): ARTHROPLASTY BIPOLAR HIP (Right) Plan: Discharge home with home health  from orthopedic viewpoint. she will need home health physical therapy.  TED hose for DVT prophylaxis. Weight-bear as tolerated on right lower extremity with posterior hip precautions Rx written for Norco 5 mg one every 6-8 hours as needed for pain. I would minimize pain medication in this patient. Followup with Dr. Latanya [name] in the office in 2 weeks.    Catheleen Langhorne G 05/01/2014, 10:07 AM

## 2014-05-01 NOTE — Discharge Summary (Signed)
Physician Discharge Summary  Katherine [name] DDU:[id number] DOB: January 18, 1923 DOA: 04/25/2014  PCP: [name], MD  Admit date: 04/25/2014 Discharge date: 05/01/2014  Recommendations for Outpatient Follow-up:  1. Followup right femoral neck fracture, status post operative intervention  2. Followup microcytic anemia, see discussion below, consider repeat CBC in the next few weeks to assess response to iron. Consider EGD if fails iron therapy. 3. Severe malnutrition.   Follow-up Information   Follow up with [name], MD. Schedule an appointment as soon as possible for a visit in 2 weeks.   Specialty:  Orthopedic Surgery   Contact information:   [name] [address] [phone]       Follow up with [facility]. (Someone from [facility] will contact you concerning start date of home health.)    Contact information:   [name] [address] [phone]       Follow up with [name], MD. Schedule an appointment as soon as possible for a visit in 2 weeks.   Specialty:  Orthopedic Surgery   Contact information:   [name] [place] [address] [phone]       Follow up with [name], MD. Schedule an appointment as soon as possible for a visit in 1 week.   Specialty:  Family Medicine   Contact information:   [name] [place] [address] [phone]      Discharge Diagnoses:  1. Right femoral neck fracture 2. Microcytic anemia, suspect acute blood loss anemia secondary to fracture with chronic GI blood loss 3. Dementia 4. Hypernatremia 5. Subacute encephalopathy 6. Severe malnutrition in context of chronic illness  Discharge Condition: Improved Disposition: Home with home health physical therapy  Diet recommendation: soft diet  Filed Weights   04/25/14 1619  Weight: 44.453 kg (98 lb)    History of present illness:  80yow presented to ED with h/o confusion  above baseline, h/o fall 1 day prior to presentation, non-ambulatory since. Workup revealed right femoral neck fracture.    Hospital Course:  Patient was admitted, seen in consultation with orthopedics, surgery was postponed secondary to anemia requiring blood transfusion. Surgery was subsequently accomplished without apparent complication. Hemoglobin decreased after surgery, likely combination of bleeding around the fracture site, surgical blood loss superimposed on chronic GI blood loss. Because of microcytic anemia patient was seen by gastroenterology with recommendations as below. She was evaluated by therapy and social work, however daughter felt it was best to patient return home because of her dementia.  1. Right femoral neck fracture. Status post operative intervention 6/11. Cleared by orthopedics for discharge. 2. Encephalopathy, suspect subacute, complicated by fracture. Appears to be baseline. No evidence of infection. 3. Microcytic anemia, combination acute blood loss anemia superimposed on chronic blood loss. Stable. Likely multifactorial. 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 Suspect acute component of bleeding at the fracture site superimposed on chronic blood loss. GI consultation appreciated. Patient given parenteral iron. Plan as below. 4. Dementia, depression on Aricept, Seroquel and Risperdal. Stable, alert and cooperative. 5. Hypernatremia, dehydration. Resolved. Likely secondary to poor oral intake. 6. H/o NSTEMI 06/2012 in setting of significant anemia Hgb 5.3, medical management recommened with no plans for interventional investigation. Stable. 7. Severe malnutrition in the context of chronic illness. History of breast cancer. Followup as an outpatient. 8. Underweight Management of fracture per orthopedics. Only TED hose for DVT prophylaxis. Follow up in  office 2 weeks with Dr. Latanya [name]. Continue WBAT, posterior hip  precautions. Continue bowel regimen as outpatient  Permanent iron supplementation. Consider EGD if unable to maintain blood counts with supplement iron. Empiric PPI.  Home with [facility]  Consultants:  Orthopedics  Gastroenterology Procedures:  Transfusion 2 units PRBC 6/9  6/11 hemiarthroplasty right hip  Discharge Instructions  Discharge Instructions   Compression stockings    Complete by:  As directed      Diet - low sodium heart healthy    Complete by:  As directed      Discharge instructions    Complete by:  As directed   Call physician or seek immediate medical attention for increased pain, bleeding, fever, or worsening of condition. Iron supplementation has been started, please note that this will cause stool to turn black normally. Iron may also cause constipation therefore laxatives have been recommended. For questions in regard to wound care, activity or other questions relating to orthopedic surgery, contact Dr. Erin Sons office.     Full weight bearing    Complete by:  As directed      Increase activity slowly    Complete by:  As directed             Medication List         bisacodyl 10 MG suppository  Commonly known as:  DULCOLAX  Place 1 suppository (10 mg total) rectally daily as needed for moderate constipation.     donepezil 5 MG tablet  Commonly known as:  ARICEPT  Take 5 mg by mouth at bedtime.     ferrous sulfate 325 (65 FE) MG tablet  Take 1 tablet (325 mg total) by mouth every Monday, Wednesday, and Friday.     hydrochlorothiazide 12.5 MG tablet  Commonly known as:  HYDRODIURIL  Take 12.5 mg by mouth daily.     HYDROcodone-acetaminophen 5-325 MG per tablet  Commonly known as:  NORCO  Take 1 tablet by mouth every 6 (six) hours as needed for moderate pain.     polyethylene glycol packet  Commonly known as:  MIRALAX / GLYCOLAX  Take 17 g by mouth daily as needed for mild constipation.     QUEtiapine 50 MG tablet  Commonly known as:  SEROQUEL   Take 50 mg by mouth at bedtime.     risperiDONE 0.5 MG tablet  Commonly known as:  RISPERDAL  Take 0.5 mg by mouth 2 (two) times daily.       Allergies  Allergen Reactions  . Simvastatin     REACTION: nightmares    The results of significant diagnostics from this hospitalization (including imaging, microbiology, ancillary and laboratory) are listed below for reference.    Significant Diagnostic Studies: Dg Chest 1 View  04/25/2014   CLINICAL DATA:  Altered mental status.  Fall.  Dementia.  EXAM: CHEST - 1 VIEW  COMPARISON:  Multiple prior chest radiographs dating back to 09/06/2010.  FINDINGS: Patient rotated to the left. Stable cardiomegaly. Atherosclerotic calcification of the thoracic aortic arch. Chronic mild elevation of the left hemidiaphragm with chronic blunting of the left costophrenic angle. Chain suture noted with in the left lung. Right lung is hyperinflated and clear. Cholecystectomy clips noted. Diffuse osteopenia.  IMPRESSION: Postsurgical changes of the left lung with chronic volume loss, elevation left hemidiaphragm, and chronic blunting of the left costophrenic angle. No acute findings in the chest.  Stable cardiomegaly.   Electronically Signed   By: Curlene Dolphin M.D.   On: 04/25/2014  17:39   Dg Hip Bilateral W/pelvis  04/25/2014   CLINICAL DATA:  Fall.  Hip pain.  Dementia.  EXAM: BILATERAL HIP WITH PELVIS - 4+ VIEW  COMPARISON:  None.  FINDINGS: The bones are diffusely osteopenic. There is an acute right femoral neck fracture with varus angulation. Slight proximal displacement of the distal fracture fragment. The right hip is located.  The left hip is located and proximal left femur appears intact.  Pelvic ring appears intact.  No fracture or diastasis is identified.  IMPRESSION: Acute right femoral neck fracture.   Electronically Signed   By: Curlene Dolphin M.D.   On: 04/25/2014 17:43   Ct Head Wo Contrast  04/25/2014   CLINICAL DATA:  80 year old female with altered  mental status fall and hip pain. Initial encounter.  EXAM: CT HEAD WITHOUT CONTRAST  CT CERVICAL SPINE WITHOUT CONTRAST  TECHNIQUE: Multidetector CT imaging of the head and cervical spine was performed following the standard protocol without intravenous contrast. Multiplanar CT image reconstructions of the cervical spine were also generated.  COMPARISON:  Head CT 11/22/2007.  Brain MRI 11/23/2007.  FINDINGS: CT HEAD FINDINGS  Study is intermittently degraded by motion artifact despite repeated imaging attempts.  Focal left forehead scalp hematoma measures up to 8 mm in thickness. Underlying left frontal bone is intact. No other scalp hematoma identified. Grossly stable and clear paranasal sinuses and mastoids. Grossly negative orbits soft tissues.  Calcified atherosclerosis at the skull base. Cerebral volume is not significantly changed since 2009. No ventriculomegaly. No midline shift, mass effect, or evidence of intracranial mass lesion. No acute intracranial hemorrhage identified. No evidence of cortically based acute infarction identified.  CT CERVICAL SPINE FINDINGS  Study is moderately to severely degraded by motion artifact despite repeated imaging attempts.  Reformatted images are not very useful on this study related to the motion. Axial images suggest no definite acute cervical spine fracture. Grossly intact visualized upper thoracic levels. Negative lung apices except for scarring and emphysema. Grossly negative paraspinal soft tissues.  IMPRESSION: 1. No definite acute intracranial abnormality and stable noncontrast CT appearance of the brain since 2009 allowing for motion artifact. 2. Motion degraded cervical spine CT, with no definite acute cervical spine fracture. If neck pain persists, a repeat cervical spine CT is recommended when the patient can better cooperate.   Electronically Signed   By: Lars Pinks M.D.   On: 04/25/2014 17:24   Microbiology: Recent Results (from the past 240 hour(s))  URINE  CULTURE     Status: None   Collection Time    04/25/14  4:27 PM      Result Value Ref Range Status   Specimen Description URINE, CATHETERIZED   Final   Special Requests ADDED [id number]   Final   Culture  Setup Time     Final   Value: 04/25/2014 21:56     Performed at [facility]     Final   Value: NO GROWTH     Performed at [facility]   Culture     Final   Value: NO GROWTH     Performed at [facility]   Report Status 04/27/2014 FINAL   Final  SURGICAL PCR SCREEN     Status: None   Collection Time    04/26/14  5:57 AM      Result Value Ref Range Status   MRSA, PCR NEGATIVE  NEGATIVE Final   Staphylococcus aureus NEGATIVE  NEGATIVE Final  Comment:            The Xpert SA Assay (FDA     approved for NASAL specimens     in patients over 4 years of age),     is one component of     a comprehensive surveillance     program.  Test performance has     been validated by [employer] for patients greater     than or equal to 26 year old.     It is not intended     to diagnose infection nor to     guide or monitor treatment.     Labs: Basic Metabolic Panel:  Recent Labs Lab 04/25/14 1640 04/26/14 0525 04/27/14 1145 04/28/14 0550 04/29/14 0615  NA 153* 152* 152* 147 142  K 3.5* 3.4* 4.9 4.5 4.7  CL 116* 117* 119* 114* 111  CO2 20 19 21 20 21   GLUCOSE 130* 129* 164* 105* 121*  BUN 45* 47* 40* 33* 24*  CREATININE 1.22* 1.10 0.93 0.93 0.85  CALCIUM 10.1 9.3 9.6 9.1 8.9   Liver Function Tests:  Recent Labs Lab 04/25/14 1640  AST 36  ALT 18  ALKPHOS 87  BILITOT 1.1  PROT 8.1  ALBUMIN 3.0*    Recent Labs Lab 04/25/14 1640  LIPASE 42   CBC:  Recent Labs Lab 04/25/14 1640  04/28/14 0550 04/28/14 1951 04/29/14 0615 04/30/14 0415 05/01/14 0552  WBC 10.3  < > 6.4 8.6 7.2 8.4 8.1  NEUTROABS 8.9*  --   --   --   --   --   --   HGB 8.1*  < > 7.1* 11.1* 9.4* 8.1* 9.3*  HCT 27.7*  < > 24.4* 35.2* 29.8* 26.2*  30.3*  MCV 73.3*  < > 76.3* 79.5 78.6 79.4 79.5  PLT 208  < > 155 136* 135* 146* 184  < > = values in this interval not displayed. Cardiac Enzymes:  Recent Labs Lab 04/25/14 1640  TROPONINI <0.30     Recent Labs  04/25/14 1640  PROBNP 1724.0*   CBG:  Recent Labs Lab 04/25/14 1737  GLUCAP 126*    Principal Problem:   Fracture of femoral neck, right Active Problems:   Iron deficiency anemia secondary to blood loss (chronic)   Gastric antral vascular ectasia (watermelon stomach)   Malnutrition   Hypernatremia   Dehydration   Severe malnutrition   Time coordinating discharge: 25 minutes  Signed:  Murray Hodgkins, MD [facility] 05/01/2014, 1:39 PM

## 2014-05-01 NOTE — Progress Notes (Signed)
PROGRESS NOTE  Katherine [name] ZOX:[id number] DOB: 05/29/1923 DOA: 04/25/2014 PCP: [name], MD  Summary: 75yow presented to ED with h/o confusion above basdeline, h/o fall 1 day prior to presentation, non-ambulatory since. Workup revealed right femoral neck fracture. CT head and cervical spine did not show acute intracranial findings. CXR showed postsurgical changes of the left lung with chronic volume loss, elevation left hemidiaphragm, and chronic blunting of the left costophrenic angle however no acute findings in the chest. UA did not show leukocytes or nitrites.   Assessment/Plan: 1. Right femoral neck fracture. Status post operative intervention 6/11. Cleared by orthopedics for discharge. 2. Encephalopathy, suspect subacute, complicated by fracture. Appears to be baseline. No evidence of infection. 3. Microcytic anemia, combination acute blood loss anemia superimposed on chronic blood loss. Stable. Likely multifactorial. 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 Suspect acute component of bleeding at the fracture site superimposed on chronic blood loss. GI consultation appreciated. Patient given parenteral iron. Plan as below. 4. Dementia, depression on Aricept, Seroquel and Risperdal. Stable, alert and cooperative. 5. Hypernatremia, dehydration. Resolved. Likely secondary to poor oral intake. 6. H/o NSTEMI 06/2012 in setting of significant anemia Hgb 5.3, medical management recommened with no plans for interventional investigation. Stable. 7. Severe malnutrition in the context of chronic illness. History of breast cancer. Followup as an outpatient. 8. Underweight   Management of fracture per orthopedics. Only TED hose  for DVT prophylaxis. Follow up in office 2 weeks with Dr. Latanya [name]. Continue WBAT, posterior hip precautions.  Continue bowel regimen as outpatient  Permanent iron supplementation. Consider EGD if unable to maintain blood counts  with supplement iron. Empiric PPI.  Home with [facility]  Code Status: full code DVT prophylaxis: SCDs (anemia) Family Communication:  Disposition Plan:   Murray Hodgkins, MD  [facility]  Pager [phone] If 7PM-7AM, please contact night-coverage at [email or web address], password [facility] 05/01/2014, 1:00 PM  LOS: 6 days   Consultants:  Orthopedics   Gastroenterology  Procedures:  Transfusion 2 units PRBC 6/9  6/11 hemiarthroplasty right hip  Antibiotics:    HPI/Subjective: No problems overnight per RN. Patient appears calm, history limited secondary to dementia.  Objective: Filed Vitals:   04/30/14 2016 05/01/14 0000 05/01/14 0400 05/01/14 0532  BP: 133/73   123/49  Pulse: 90   68  Temp: 98.1 F (36.7 C)   98.1 F (36.7 C)  TempSrc: Oral   Oral  Resp: 18 16 16 16   Height:      Weight:      SpO2: 90% 95% 95% 98%    Intake/Output Summary (Last 24 hours) at 05/01/14 1300 Last data filed at 05/01/14 0900  Gross per 24 hour  Intake    960 ml  Output      0 ml  Net    960 ml     Filed Weights   04/25/14 1619  Weight: 44.453 kg (98 lb)    Exam:   Afebrile, VSS, no hypoxia Gen. Appears calm, comfortable. Psych. Pleasantly confused. Follows simple commands. Cardiovascular. Regular rate and rhythm. No murmur, rub or gallop. No lower extremity edema. Respiratory. Clear to auscultation bilaterally. No wheezes, rales or rhonchi. Normal respiratory effort. Abdomen. Soft, positive bowel sounds, nontender. Musculoskeletal. Moves all extremities. Neurologic. Grossly nonfocal.  Data Reviewed: I/O: Last BM 6/11. Good urine output. Heme: Hemoglobin 9.3, stable.  Scheduled Meds: . donepezil  5 mg Oral QHS  . feeding supplement (ENSURE COMPLETE)  237 mL Oral BID  BM  . ferrous sulfate  325 mg Oral Q M,W,F  . polyethylene glycol  17 g Oral BID  . QUEtiapine  50 mg Oral QHS  . risperiDONE  0.5 mg Oral BID   Continuous Infusions:    Principal Problem:   Fracture  of femoral neck, right Active Problems:   Iron deficiency anemia secondary to blood loss (chronic)   Gastric antral vascular ectasia (watermelon stomach)   Malnutrition   Hypernatremia   Dehydration   Severe malnutrition

## 2014-05-02 ENCOUNTER — Encounter (HOSPITAL_COMMUNITY): Payer: Self-pay | Admitting: Orthopaedic Surgery

## 2014-06-03 NOTE — OR Nursing (Signed)
Late entry for infection and specific hemi under procedural recording

## 2014-07-26 ENCOUNTER — Encounter: Payer: Self-pay | Admitting: Gastroenterology

## 2014-08-21 ENCOUNTER — Emergency Department (HOSPITAL_COMMUNITY): Payer: PRIVATE HEALTH INSURANCE

## 2014-08-21 ENCOUNTER — Inpatient Hospital Stay (HOSPITAL_COMMUNITY)
Admission: EM | Admit: 2014-08-21 | Discharge: 2014-09-18 | DRG: 208 | Disposition: E | Payer: PRIVATE HEALTH INSURANCE | Attending: Pulmonary Disease | Admitting: Pulmonary Disease

## 2014-08-21 DIAGNOSIS — R748 Abnormal levels of other serum enzymes: Secondary | ICD-10-CM | POA: Diagnosis present

## 2014-08-21 DIAGNOSIS — J9600 Acute respiratory failure, unspecified whether with hypoxia or hypercapnia: Secondary | ICD-10-CM | POA: Diagnosis present

## 2014-08-21 DIAGNOSIS — E872 Acidosis: Secondary | ICD-10-CM | POA: Diagnosis present

## 2014-08-21 DIAGNOSIS — Z66 Do not resuscitate: Secondary | ICD-10-CM | POA: Diagnosis present

## 2014-08-21 DIAGNOSIS — E43 Unspecified severe protein-calorie malnutrition: Secondary | ICD-10-CM | POA: Diagnosis present

## 2014-08-21 DIAGNOSIS — R570 Cardiogenic shock: Secondary | ICD-10-CM | POA: Diagnosis present

## 2014-08-21 DIAGNOSIS — Z79899 Other long term (current) drug therapy: Secondary | ICD-10-CM | POA: Diagnosis not present

## 2014-08-21 DIAGNOSIS — I10 Essential (primary) hypertension: Secondary | ICD-10-CM | POA: Diagnosis present

## 2014-08-21 DIAGNOSIS — Z9011 Acquired absence of right breast and nipple: Secondary | ICD-10-CM | POA: Diagnosis present

## 2014-08-21 DIAGNOSIS — Z96649 Presence of unspecified artificial hip joint: Secondary | ICD-10-CM | POA: Diagnosis present

## 2014-08-21 DIAGNOSIS — R000 Tachycardia, unspecified: Secondary | ICD-10-CM | POA: Diagnosis present

## 2014-08-21 DIAGNOSIS — N390 Urinary tract infection, site not specified: Secondary | ICD-10-CM | POA: Diagnosis present

## 2014-08-21 DIAGNOSIS — D638 Anemia in other chronic diseases classified elsewhere: Secondary | ICD-10-CM | POA: Diagnosis present

## 2014-08-21 DIAGNOSIS — N179 Acute kidney failure, unspecified: Secondary | ICD-10-CM | POA: Diagnosis present

## 2014-08-21 DIAGNOSIS — D539 Nutritional anemia, unspecified: Secondary | ICD-10-CM | POA: Diagnosis present

## 2014-08-21 DIAGNOSIS — I469 Cardiac arrest, cause unspecified: Secondary | ICD-10-CM | POA: Diagnosis present

## 2014-08-21 DIAGNOSIS — Z8673 Personal history of transient ischemic attack (TIA), and cerebral infarction without residual deficits: Secondary | ICD-10-CM | POA: Diagnosis not present

## 2014-08-21 DIAGNOSIS — R4020 Unspecified coma: Secondary | ICD-10-CM | POA: Diagnosis present

## 2014-08-21 DIAGNOSIS — Z809 Family history of malignant neoplasm, unspecified: Secondary | ICD-10-CM | POA: Diagnosis not present

## 2014-08-21 DIAGNOSIS — K5730 Diverticulosis of large intestine without perforation or abscess without bleeding: Secondary | ICD-10-CM | POA: Diagnosis present

## 2014-08-21 DIAGNOSIS — M069 Rheumatoid arthritis, unspecified: Secondary | ICD-10-CM | POA: Diagnosis present

## 2014-08-21 DIAGNOSIS — J189 Pneumonia, unspecified organism: Secondary | ICD-10-CM | POA: Diagnosis present

## 2014-08-21 DIAGNOSIS — F329 Major depressive disorder, single episode, unspecified: Secondary | ICD-10-CM | POA: Diagnosis present

## 2014-08-21 DIAGNOSIS — M810 Age-related osteoporosis without current pathological fracture: Secondary | ICD-10-CM | POA: Diagnosis present

## 2014-08-21 DIAGNOSIS — Z853 Personal history of malignant neoplasm of breast: Secondary | ICD-10-CM | POA: Diagnosis not present

## 2014-08-21 DIAGNOSIS — I4901 Ventricular fibrillation: Secondary | ICD-10-CM | POA: Diagnosis present

## 2014-08-21 DIAGNOSIS — F0390 Unspecified dementia without behavioral disturbance: Secondary | ICD-10-CM | POA: Diagnosis present

## 2014-08-21 DIAGNOSIS — R64 Cachexia: Secondary | ICD-10-CM | POA: Diagnosis present

## 2014-08-21 DIAGNOSIS — E870 Hyperosmolality and hypernatremia: Secondary | ICD-10-CM | POA: Diagnosis present

## 2014-08-21 DIAGNOSIS — F17220 Nicotine dependence, chewing tobacco, uncomplicated: Secondary | ICD-10-CM | POA: Diagnosis present

## 2014-08-21 LAB — CBC WITH DIFFERENTIAL/PLATELET
Basophils Absolute: 0 10*3/uL (ref 0.0–0.1)
Basophils Relative: 0 % (ref 0–1)
EOS PCT: 0 % (ref 0–5)
Eosinophils Absolute: 0 10*3/uL (ref 0.0–0.7)
HCT: 32.3 % — ABNORMAL LOW (ref 36.0–46.0)
Hemoglobin: 10.4 g/dL — ABNORMAL LOW (ref 12.0–15.0)
LYMPHS ABS: 1.9 10*3/uL (ref 0.7–4.0)
LYMPHS PCT: 18 % (ref 12–46)
MCH: 27.8 pg (ref 26.0–34.0)
MCHC: 32.2 g/dL (ref 30.0–36.0)
MCV: 86.4 fL (ref 78.0–100.0)
MONOS PCT: 4 % (ref 3–12)
Monocytes Absolute: 0.4 10*3/uL (ref 0.1–1.0)
Neutro Abs: 8.1 10*3/uL — ABNORMAL HIGH (ref 1.7–7.7)
Neutrophils Relative %: 78 % — ABNORMAL HIGH (ref 43–77)
Platelets: 96 10*3/uL — ABNORMAL LOW (ref 150–400)
RBC: 3.74 MIL/uL — AB (ref 3.87–5.11)
RDW: 16.1 % — ABNORMAL HIGH (ref 11.5–15.5)
WBC: 10.4 10*3/uL (ref 4.0–10.5)

## 2014-08-21 LAB — BASIC METABOLIC PANEL
Anion gap: 18 — ABNORMAL HIGH (ref 5–15)
BUN: 42 mg/dL — AB (ref 6–23)
CALCIUM: 9.6 mg/dL (ref 8.4–10.5)
CO2: 17 meq/L — AB (ref 19–32)
CREATININE: 1.21 mg/dL — AB (ref 0.50–1.10)
Chloride: 117 mEq/L — ABNORMAL HIGH (ref 96–112)
GFR calc Af Amer: 45 mL/min — ABNORMAL LOW (ref >90)
GFR calc non Af Amer: 38 mL/min — ABNORMAL LOW (ref >90)
GLUCOSE: 53 mg/dL — AB (ref 70–99)
Potassium: 4 mEq/L (ref 3.7–5.3)
Sodium: 152 mEq/L — ABNORMAL HIGH (ref 137–147)

## 2014-08-21 LAB — I-STAT ARTERIAL BLOOD GAS, ED
ACID-BASE DEFICIT: 11 mmol/L — AB (ref 0.0–2.0)
BICARBONATE: 16.5 meq/L — AB (ref 20.0–24.0)
O2 Saturation: 100 %
PCO2 ART: 42.7 mmHg (ref 35.0–45.0)
PO2 ART: 334 mmHg — AB (ref 80.0–100.0)
Patient temperature: 95.6
TCO2: 18 mmol/L (ref 0–100)
pH, Arterial: 7.186 — CL (ref 7.350–7.450)

## 2014-08-21 LAB — URINALYSIS, ROUTINE W REFLEX MICROSCOPIC
Glucose, UA: 100 mg/dL — AB
Ketones, ur: NEGATIVE mg/dL
Nitrite: POSITIVE — AB
SPECIFIC GRAVITY, URINE: 1.025 (ref 1.005–1.030)
Urobilinogen, UA: 0.2 mg/dL (ref 0.0–1.0)
pH: 5.5 (ref 5.0–8.0)

## 2014-08-21 LAB — URINE MICROSCOPIC-ADD ON

## 2014-08-21 LAB — HEPATIC FUNCTION PANEL
ALT: 235 U/L — AB (ref 0–35)
AST: 592 U/L — ABNORMAL HIGH (ref 0–37)
Albumin: 1.2 g/dL — ABNORMAL LOW (ref 3.5–5.2)
Alkaline Phosphatase: 74 U/L (ref 39–117)
BILIRUBIN TOTAL: 0.7 mg/dL (ref 0.3–1.2)
Bilirubin, Direct: 0.3 mg/dL (ref 0.0–0.3)
Indirect Bilirubin: 0.4 mg/dL (ref 0.3–0.9)
Total Protein: 3.6 g/dL — ABNORMAL LOW (ref 6.0–8.3)

## 2014-08-21 LAB — TROPONIN I: TROPONIN I: 0.78 ng/mL — AB (ref <0.30)

## 2014-08-21 LAB — I-STAT CG4 LACTIC ACID, ED: LACTIC ACID, VENOUS: 8.46 mmol/L — AB (ref 0.5–2.2)

## 2014-08-21 MED ORDER — EPINEPHRINE HCL 0.1 MG/ML IJ SOSY
PREFILLED_SYRINGE | INTRAMUSCULAR | Status: AC | PRN
  Administered 2014-08-21: 1 via INTRAVENOUS
  Administered 2014-08-21: 0.5 mg via INTRAVENOUS
  Administered 2014-08-21 (×3): 1 via INTRAVENOUS

## 2014-08-21 MED ORDER — ATROPINE SULFATE 1 MG/ML IJ SOLN
INTRAMUSCULAR | Status: AC | PRN
  Administered 2014-08-21: .5 mg via INTRAVENOUS

## 2014-08-21 MED ORDER — LORAZEPAM 2 MG/ML IJ SOLN: 1.0000 mg | INTRAMUSCULAR | Status: DC | PRN

## 2014-08-21 MED ORDER — SODIUM CHLORIDE 0.9 % IV BOLUS (SEPSIS): 1000.0000 mL | Freq: Once | INTRAVENOUS | Status: DC

## 2014-08-21 MED ORDER — MORPHINE SULFATE 2 MG/ML IJ SOLN: 1.0000 mg | INTRAMUSCULAR | Status: DC | PRN

## 2014-08-21 MED ORDER — NOREPINEPHRINE BITARTRATE 1 MG/ML IV SOLN
4.0000 mg | INTRAVENOUS | Status: AC | PRN
  Administered 2014-08-21: 5 ug/min via INTRAVENOUS

## 2014-08-21 MED ORDER — DEXTROSE 5 % IV SOLN: 1.0000 g | Freq: Once | INTRAVENOUS | Status: DC

## 2014-08-21 MED ORDER — DEXTROSE 5 % IV SOLN
0.5000 ug/min | INTRAVENOUS | Status: DC
  Filled 2014-08-21: qty 1

## 2014-08-21 MED ORDER — DEXTROSE 5 % IV SOLN
300.0000 mg | INTRAVENOUS | Status: AC | PRN
  Administered 2014-08-21: 300 mg via INTRAVENOUS

## 2014-08-21 MED ORDER — DEXTROSE 5 % IV SOLN: 500.0000 mg | Freq: Once | INTRAVENOUS | Status: DC

## 2014-08-21 MED ORDER — SODIUM BICARBONATE 8.4 % IV SOLN
INTRAVENOUS | Status: AC | PRN
  Administered 2014-08-21: 50 meq via INTRAVENOUS

## 2014-08-21 MED ORDER — CALCIUM CHLORIDE 10 % IV SOLN
INTRAVENOUS | Status: AC | PRN
  Administered 2014-08-21: 1 g via INTRAVENOUS

## 2014-08-21 MED FILL — Medication: Qty: 1 | Status: AC

## 2014-08-27 LAB — CULTURE, BLOOD (ROUTINE X 2): Culture: NO GROWTH

## 2014-09-18 NOTE — ED Notes (Signed)
RN to call not ready @ [id number] for CT

## 2014-09-18 NOTE — ED Notes (Signed)
Lactic acid 8.46 mmol/L given to Dr. Bartholome Bill

## 2014-09-18 NOTE — ED Notes (Signed)
Critical Care MD at bedside.  

## 2014-09-18 NOTE — H&P (Signed)
CC: Altered mental status  HPI: Katherine [name] is a 80 y.o. female who was found unresponsive this morning by family.  EMS called, and she was hypotensive.  In ER she developed PEA.  She was intubated, and CPR started.  She had ROSC, but required epinephrine.  After discussion with family decision was made for DNR status with no escalation of care.  She  has a past medical history of Hypertension; TIA (transient ischemic attack); Renal insufficiency; Depression; Rheumatoid arteritis; Breast cancer; Anemia, macrocytic; Osteoporosis; GI bleed (09/10/2010); Dementia; Diverticulosis of colon (07/21/2009); Headache(784.0); Femoral neck fracture (04/27/2014); Breast cancer, right (2000); Rheumatoid arthritis (2007); and Malnutrition (04/2014).  She  has past surgical history that includes Appendectomy; Cholecystectomy; lung biopsies. (2011); Esophagogastroduodenoscopy (07/15/2012); Mastectomy (Right, 2000); Dental surgery (2001); and Hip Arthroplasty (Right, 04/28/2014).  Her family history includes Cancer in her sister; Coronary artery disease in her mother; Diabetes in her brother and daughter.  She  reports that she has quit smoking. Her smokeless tobacco use includes Snuff. She reports that she uses illicit drugs (Psilocybin). She reports that she does not drink alcohol.  Allergies  Allergen Reactions  . Simvastatin Other (See Comments)    REACTION: nightmares   No current facility-administered medications on file prior to encounter.   Current Outpatient Prescriptions on File Prior to Encounter  Medication Sig Dispense Refill  . donepezil (ARICEPT) 5 MG tablet Take 5 mg by mouth at bedtime.      . ferrous sulfate 325 (65 FE) MG tablet Take 1 tablet (325 mg total) by mouth every Monday, Wednesday, and Friday.  90 tablet  0  . hydrochlorothiazide (HYDRODIURIL) 12.5 MG tablet Take 12.5 mg by mouth daily.      . QUEtiapine (SEROQUEL) 50 MG tablet Take 50 mg by mouth at bedtime.      . risperiDONE  (RISPERDAL) 0.5 MG tablet Take 0.5 mg by mouth 2 (two) times daily.       ROS: Unable to obtain  PHYSICAL EXAM: Temp:  [91.9 F (33.3 C)-92.8 F (33.8 C)] 92.1 F (33.4 C) (10/04 1215) Pulse Rate:  [29-117] 39 (10/04 1145) Resp:  [15-27] 23 (10/04 1215) BP: (54-109)/(26-64) 65/38 mmHg (10/04 1215) SpO2:  [23 %-77 %] 72 % (10/04 1100) FiO2 (%):  [50 %-100 %] 50 % (10/04 1140) Weight:  [95 lb (43.092 kg)] 95 lb (43.092 kg) (10/04 1022)  General: cachetic NEURO: comatose HEENT: temporal wasting Cardiac: tachycardic Chest: b/l rhonchi Abd: soft, absent bowel sounds Ext: no edema Skin: no rashes  CBC Recent Labs     2014-08-28  0955  WBC  10.4  HGB  10.4*  HCT  32.3*  PLT  96*    Coag's No results found for this basename: APTT, INR,  in the last 72 hours  BMET Recent Labs     08/28/2014  0955  NA  152*  K  4.0  CL  117*  CO2  17*  BUN  42*  CREATININE  1.21*  GLUCOSE  53*    Electrolytes Recent Labs     2014-08-28  0955  CALCIUM  9.6    Sepsis Markers No results found for this basename: LACTICACIDVEN, PROCALCITON, O2SATVEN,  in the last 72 hours  ABG Recent Labs     08/28/2014  1018  PHART  7.186*  PCO2ART  42.7  PO2ART  334.0*    Liver Enzymes Recent Labs     28-Aug-2014  0955  AST  592*  ALT  235*  ALKPHOS  31  BILITOT  0.7  ALBUMIN  1.2*    Cardiac Enzymes Recent Labs     07-Sep-2014  0951  TROPONINI  0.78*    Glucose No results found for this basename: GLUCAP,  in the last 72 hours  Imaging Dg Chest Portable 1 View  2014-09-07   CLINICAL DATA:  Altered mental status, hypertension, history of breast cancer treated with mastectomy and radiation therapy  EXAM: PORTABLE CHEST - 1 VIEW  COMPARISON:  PA and lateral chest x-ray of April 25, 2014.  FINDINGS: The lungs are well-expanded. The left lung base is partially obscured by external pacing pads. There is likely left lower lobe atelectasis however. There is chronic blunting of the left lateral  costophrenic angle. The interstitial markings within the right upper and lower lobe and left upper lobe are increased. The cardiac silhouette is mildly enlarged. The pulmonary vascularity is indistinct.  IMPRESSION: COPD with superimposed in bilateral interstitial infiltrates worrisome for pneumonia. There is no definite pulmonary vascular congestion. When the patient can tolerate the procedure, a PA and lateral chest x-ray would be useful.   Electronically Signed   By: David  Martinique   On: September 07, 2014 10:26    Assessment: Community acquired pneumonia PEA cardiac arrest Acute encephalopathy Acute respiratory failure Cardiogenic shock Hx of rheumatoid arthritis Severe protein calorie malnutrition Hx of HTN Hx of Dementia Hx of Tobacco abuse Metabolic lactic acidosis Hypernatremia Acute kidney injury Elevated liver enzymes Anemia of chronic disease  Plan: DNR Continue current tx, but no escalation of care PRN morphine, ativan  Family is aware that she will not survive this illness  Chesley Mires, MD [place] 09/07/2014, 12:31 PM Pager:  [phone] After 3pm call: [phone]

## 2014-09-18 NOTE — ED Provider Notes (Signed)
CSN: [id number]     Arrival date & time 08/27/2014  2440 History   First MD Initiated Contact with Patient 08-27-14 [phone]     Chief Complaint  Patient presents with  . Altered Mental Status     (Consider location/radiation/quality/duration/timing/severity/associated sxs/prior Treatment) HPI Comments: 80 year old female presenting via EMS secondary to unresponsiveness. EMS was called house where the patient lives with her daughter secondary to her daughter finding her unresponsive this morning. Reportedly, patient was her normal self last night. EMS reports that she was hypotensive and minimally responsive, requiring assisted ventilation, but maintaining pulses during their transport. However, she lost pulses and she was being transferred to the ED stretcher. CPR was immediately initiated.  Level V caveat secondary to unresponsive.  Patient is a 80 y.o. female presenting with altered mental status.  Altered Mental Status Presenting symptoms: unresponsiveness     Past Medical History  Diagnosis Date  . Hypertension   . TIA (transient ischemic attack)     Declines aggrenox  . Renal insufficiency     Baseline 1.3  . Depression     Improved with remeron  . Rheumatoid arteritis     Previously on MTX x63yr, failed appropiate f/u  . 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  Marland Kitchen Anemia, macrocytic      most likely secondary to methotrexate use  . Osteoporosis      T score- 3.0 right hip, -2.8 left hip and -2.1 lumbar spine (April 22, 2007). Not on bisphosphonate because of renal insufficiency.  . 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 Dementia   . Diverticulosis of colon 07/21/2009    severe widely distributed tics on colonoscopy  . Headache(784.0)   . Femoral neck fracture 04/27/2014    04/2014 from fall  . Breast cancer, right 2000     mastectomy and radiation. Intraductal  . Rheumatoid arthritis 2007  . Malnutrition 04/2014   Past Surgical History  Procedure Laterality Date  . Appendectomy    . Cholecystectomy    . Lung biopsies.  2011    VATS bx:  benign granuloma.   . Esophagogastroduodenoscopy  07/15/2012    Procedure: ESOPHAGOGASTRODUODENOSCOPY (EGD);  Surgeon: Gatha Mayer, MD;  Location: [facility];  Service: Endoscopy;  Laterality: N/A;  . Mastectomy Right 2000  . Dental surgery  2001    multiple surgical extractions.   . Hip arthroplasty Right 04/28/2014    Procedure: ARTHROPLASTY BIPOLAR HIP;  Surgeon: [name], MD;  Location: [facility];  Service: Orthopedics;  Laterality: Right;  NON-CEMENTED   Family History  Problem Relation Age of Onset  . Cancer Sister     Died  . Diabetes Brother   . Coronary artery disease Mother     died age 57  . Diabetes Daughter     died age 63   History  Substance Use Topics  . Smoking status: Former Research scientist (life sciences)  . Smokeless tobacco: Current User    Types: Snuff     Comment: large bag/pack lasts 2 days.  . Alcohol Use: No   OB History   Grav Para Term Preterm Abortions TAB SAB Ect Mult Living                 Review of Systems  Unable to perform ROS: Patient unresponsive  Allergies  Simvastatin  Home Medications   Prior to Admission medications   Medication Sig Start Date End Date Taking? Authorizing Provider  bisacodyl (DULCOLAX) 10 MG suppository Place 1 suppository (10 mg total) rectally daily as needed for moderate constipation. 05/01/14   Samuella Cota, MD  donepezil (ARICEPT) 5 MG tablet Take 5 mg by mouth at bedtime.    Historical Provider, MD  ferrous sulfate 325 (65 FE) MG tablet Take 1 tablet (325 mg total) by mouth every Monday, Wednesday, and Friday. 05/01/14   Samuella Cota, MD  hydrochlorothiazide (HYDRODIURIL) 12.5 MG tablet Take 12.5 mg by mouth daily. 03/05/14   Historical Provider, MD  HYDROcodone-acetaminophen (NORCO) 5-325  MG per tablet Take 1 tablet by mouth every 6 (six) hours as needed for moderate pain. 04/28/14   Larwance Sachs Nida, PA-C  polyethylene glycol [facility] / GLYCOLAX) packet Take 17 g by mouth daily as needed for mild constipation. 05/01/14   Samuella Cota, MD  QUEtiapine (SEROQUEL) 50 MG tablet Take 50 mg by mouth at bedtime. 03/05/14   Historical Provider, MD  risperiDONE (RISPERDAL) 0.5 MG tablet Take 0.5 mg by mouth 2 (two) times daily. 03/05/14   Historical Provider, MD   There were no vitals taken for this visit. Physical Exam  Nursing note and vitals reviewed. Constitutional: She appears well-developed. No distress.  Cachectic  HENT:  Head: Normocephalic and atraumatic.  Oropharynx dry  Eyes:  Pupils equal but minimally responsive  Neck: Neck supple. No tracheal deviation present.  Cardiovascular:  No pulses, unable to auscultate heart sounds  Pulmonary/Chest: No stridor.  Minimal respiratory effort. Mechanical breath sounds.  Abdominal: Soft. She exhibits no distension. There is no tenderness.  Musculoskeletal: Normal range of motion.  Neurological: She is unresponsive.  No motor activity  Skin: Skin is warm and dry. No rash noted.  Psychiatric: She has a normal mood and affect. Her behavior is normal.    ED Course  CRITICAL CARE Performed by: Artis Delay Authorized by: Artis Delay Total critical care time: 35 minutes Critical care time was exclusive of separately billable procedures and treating other patients. Critical care was necessary to treat or prevent imminent or life-threatening deterioration of the following conditions: cardiac failure, shock and respiratory failure. Critical care was time spent personally by me on the following activities: development of treatment plan with patient or surrogate, discussions with consultants, evaluation of patient's response to treatment, examination of patient, obtaining history from patient or surrogate, ordering and performing  treatments and interventions, ordering and review of laboratory studies, ordering and review of radiographic studies, pulse oximetry, re-evaluation of patient's condition and review of old charts.  INTUBATION Date/Time: 2014/09/13 10:22 AM Performed by: Artis Delay Authorized by: Artis Delay Consent: The procedure was performed in an emergent situation. Indications: respiratory failure and  airway protection Intubation method: direct Patient status: unconscious Preoxygenation: BVM Laryngoscope size: Mac 3 Tube size: 7.5 mm Tube type: cuffed Number of attempts: 1 Cords visualized: yes Post-procedure assessment: chest rise and CO2 detector Breath sounds: equal Cuff inflated: yes ETT to lip: 22 cm Tube secured with: ETT holder Chest x-ray interpreted by me. Chest x-ray findings: endotracheal tube in appropriate position   Cardiopulmonary Resuscitation (CPR) Procedure Note Directed/Performed by: Serita Grit DAVID III I personally directed ancillary staff and/or performed CPR in an effort to regain return of spontaneous circulation and to maintain cardiac, neuro and systemic perfusion.     (including critical care time) Labs Review Labs Reviewed  CBC WITH  DIFFERENTIAL - Abnormal; Notable for the following:    RBC 3.74 (*)    Hemoglobin 10.4 (*)    HCT 32.3 (*)    RDW 16.1 (*)    Platelets 96 (*)    Neutrophils Relative % 78 (*)    Neutro Abs 8.1 (*)    All other components within normal limits  BASIC METABOLIC PANEL - Abnormal; Notable for the following:    Sodium 152 (*)    Chloride 117 (*)    CO2 17 (*)    Glucose, Bld 53 (*)    BUN 42 (*)    Creatinine, Ser 1.21 (*)    GFR calc non Af Amer 38 (*)    GFR calc Af Amer 45 (*)    Anion gap 18 (*)    All other components within normal limits  HEPATIC FUNCTION PANEL - Abnormal; Notable for the following:    Total Protein 3.6 (*)    Albumin 1.2 (*)    AST 592 (*)    ALT 235 (*)    All other components within  normal limits  URINALYSIS, ROUTINE W REFLEX MICROSCOPIC - Abnormal; Notable for the following:    APPearance TURBID (*)    Glucose, UA 100 (*)    Hgb urine dipstick LARGE (*)    Bilirubin Urine SMALL (*)    Protein, ur >300 (*)    Nitrite POSITIVE (*)    Leukocytes, UA TRACE (*)    All other components within normal limits  TROPONIN I - Abnormal; Notable for the following:    Troponin I 0.78 (*)    All other components within normal limits  URINE MICROSCOPIC-ADD ON - Abnormal; Notable for the following:    Squamous Epithelial / LPF FEW (*)    Bacteria, UA FEW (*)    All other components within normal limits  I-STAT CG4 LACTIC ACID, ED - Abnormal; Notable for the following:    Lactic Acid, Venous 8.46 (*)    All other components within normal limits  I-STAT ARTERIAL BLOOD GAS, ED - Abnormal; Notable for the following:    pH, Arterial 7.186 (*)    pO2, Arterial 334.0 (*)    Bicarbonate 16.5 (*)    Acid-base deficit 11.0 (*)    All other components within normal limits  CULTURE, BLOOD (ROUTINE X 2)    Imaging Review Dg Chest Portable 1 View  08/23/14   CLINICAL DATA:  Altered mental status, hypertension, history of breast cancer treated with mastectomy and radiation therapy  EXAM: PORTABLE CHEST - 1 VIEW  COMPARISON:  PA and lateral chest x-ray of April 25, 2014.  FINDINGS: The lungs are well-expanded. The left lung base is partially obscured by external pacing pads. There is likely left lower lobe atelectasis however. There is chronic blunting of the left lateral costophrenic angle. The interstitial markings within the right upper and lower lobe and left upper lobe are increased. The cardiac silhouette is mildly enlarged. The pulmonary vascularity is indistinct.  IMPRESSION: COPD with superimposed in bilateral interstitial infiltrates worrisome for pneumonia. There is no definite pulmonary vascular congestion. When the patient can tolerate the procedure, a PA and lateral chest x-ray  would be useful.   Electronically Signed   By: David  Martinique   On: 08/23/14 10:26  All radiology studies independently viewed by me.      EKG Interpretation   Date/Time:  23-Aug-2014 09:39:48 EDT Ventricular Rate:  149 PR Interval:    QRS Duration:  99 QT Interval:  317 QTC Calculation: 499 R Axis:   23 Text Interpretation:  sinus tachycardia Low voltage, extremity leads  Repolarization abnormality, prob rate related No significant change was  found Confirmed by [facility]  MD, TREY ([id number]) on 09-16-2014 5:01:18 PM      MDM   Final diagnoses:  Cardiac arrest  UTI  80 yo female presenting unresponsive.  She suffered PEA arrest as she was being transferred to ED stretcher.  ACLS protocol was followed.  She at one point went into V fib arrest and was shocked with return to sinus rhythm.  She remained hypotensive and required levophed drip to augment blood pressure.  Also given aggressive fluid resuscitation.  Family provided additional history stating that patient slightly more confused than usual the past several days.  Ultimately, it appears that she had a urinary tract infection.    Artis Delay, MD 09/16/2014 [phone]

## 2014-09-18 NOTE — Procedures (Signed)
Intubation Procedure Note Katherine [name] [id number] 1923-08-11  Procedure: Intubation Indications: Respiratory insufficiency  Procedure Details Consent: Unable to obtain consent because of emergent medical necessity. Time Out: Verified patient identification, verified procedure, site/side was marked, verified correct patient position, special equipment/implants available, medications/allergies/relevent history reviewed, required imaging and test results available.  Performed  Maximum sterile technique was used including gloves, gown and hand hygiene.  MAC and 3    Evaluation Hemodynamic Status: Persistent hypotension treated with fluid; O2 sats: stable throughout Patient's Current Condition: stable Complications: No apparent complications Patient did tolerate procedure well. Chest X-ray ordered to verify placement.  CXR: pending  Pt went PEA and lost pulse upon arrival to ED. Pt manually ventilated during compressions until and airway could be obtained. Pt intubated by ED physician on 1st attempt using Mac 3 blade and 7.0 tube. Tube secured at 22 at lip with bilateral breath sounds, color change confirmed on etco2, CXR pending. Pt has large amounts of pale yellow thick secretions in ETT, as well as large amounts of tan/clear think oral secretions. No complications during intubation. Pt regained pulse and was placed on ventilator at 0945.    Jesse Sans August 22, 2014

## 2014-09-18 NOTE — Progress Notes (Signed)
Transported pt on vent from Trauma B in ED to 2H09. Pt stable throughout transport.

## 2014-09-18 NOTE — Progress Notes (Signed)
Pt arrived to floor with agonal respirations, faint femoral pulse palpable; unable to obtain BP or oxygen saturation levels; family at bedside and aware of pt's grave condition; questions encouraged and answered; will continue to closely monitor

## 2014-09-18 NOTE — Progress Notes (Signed)
I responded to a request to meet with the family of patient.  Daughter, Lattie Haw, was alone in Consult B.  I provided spiritual and emotional support to daughter. Daughter had found mother unresponsive.  Daughter was anxious and worried about her mother and very afraid that mother would die.  She asked for prayer.  After some time, Lisa's son's girlfriend arrived with her two twin babies. Then, more family arrived.  I left at approximately 10:15 AM. I was paged back to check in on family at 10:59.  Provided support to family.  Patient was transferred to 2H-09.  I was paged when patient passed at 2:12 PM.  Provided prayer and support to family members.    Chaplain Dorathy Daft [phone]

## 2014-09-18 NOTE — ED Notes (Signed)
Pt from home via EMS. Pt arrived to ED at 0918. Pt became PEA prior to be transferred to ED bed. Chest compressions started.

## 2014-09-18 NOTE — Discharge Summary (Signed)
  Katherine [name] is a 80 y.o. female who was found unresponsive this morning by family.  EMS called, and she was hypotensive.  In ER she developed PEA.  She was intubated, and CPR started.  She had ROSC, but required epinephrine.  After discussion with family decision was made for DNR status with no escalation of care.  She subsequently expired on 08-25-2014 at 2:12 PM.  Final Diagnoses: Community acquired pneumonia PEA cardiac arrest Acute encephalopathy Acute respiratory failure Cardiogenic shock Hx of rheumatoid arthritis Severe protein calorie malnutrition Hx of HTN Hx of Dementia Hx of Tobacco abuse Metabolic lactic acidosis Hypernatremia Acute kidney injury Elevated liver enzymes Anemia of chronic disease   Chesley Mires, MD [facility] Pulmonary/Critical Care 08/25/2014, 4:30 PM

## 2014-09-18 NOTE — Progress Notes (Signed)
Pt with no palpable pulse or heartbeat noted by auscultation x 2 RN-Angela B RN, Howie Ill RN; family aware and at bedside; MD aware, pt pronounced deceased at 49

## 2014-09-18 DEATH — deceased
